# Patient Record
Sex: Female | Born: 1935 | Race: White | Hispanic: No | Smoking: Never smoker
Health system: Southern US, Community
[De-identification: ages and names within clinical notes are randomized; demographics above are authoritative.]

## PROBLEM LIST (undated history)

## (undated) DIAGNOSIS — R918 Other nonspecific abnormal finding of lung field: Secondary | ICD-10-CM

## (undated) DIAGNOSIS — M7120 Synovial cyst of popliteal space [Baker], unspecified knee: Secondary | ICD-10-CM

## (undated) DIAGNOSIS — H9190 Unspecified hearing loss, unspecified ear: Secondary | ICD-10-CM

## (undated) DIAGNOSIS — F419 Anxiety disorder, unspecified: Secondary | ICD-10-CM

## (undated) DIAGNOSIS — E7800 Pure hypercholesterolemia, unspecified: Secondary | ICD-10-CM

## (undated) DIAGNOSIS — F32A Depression, unspecified: Secondary | ICD-10-CM

## (undated) DIAGNOSIS — E1140 Type 2 diabetes mellitus with diabetic neuropathy, unspecified: Secondary | ICD-10-CM

## (undated) DIAGNOSIS — D67 Hereditary factor IX deficiency: Secondary | ICD-10-CM

## (undated) DIAGNOSIS — C439 Malignant melanoma of skin, unspecified: Secondary | ICD-10-CM

## (undated) DIAGNOSIS — F329 Major depressive disorder, single episode, unspecified: Secondary | ICD-10-CM

## (undated) DIAGNOSIS — I10 Essential (primary) hypertension: Secondary | ICD-10-CM

## (undated) DIAGNOSIS — S92309A Fracture of unspecified metatarsal bone(s), unspecified foot, initial encounter for closed fracture: Secondary | ICD-10-CM

## (undated) DIAGNOSIS — K5730 Diverticulosis of large intestine without perforation or abscess without bleeding: Secondary | ICD-10-CM

## (undated) HISTORY — DX: Diverticulosis of large intestine without perforation or abscess without bleeding: K57.30

## (undated) HISTORY — PX: TONSILLECTOMY AND ADENOIDECTOMY: SUR1326

## (undated) HISTORY — DX: Type 2 diabetes mellitus with diabetic neuropathy, unspecified: E11.40

## (undated) HISTORY — DX: Hereditary factor IX deficiency: D67

## (undated) HISTORY — DX: Essential (primary) hypertension: I10

## (undated) HISTORY — DX: Fracture of unspecified metatarsal bone(s), unspecified foot, initial encounter for closed fracture: S92.309A

## (undated) HISTORY — DX: Unspecified hearing loss, unspecified ear: H91.90

## (undated) HISTORY — DX: Synovial cyst of popliteal space (Baker), unspecified knee: M71.20

## (undated) HISTORY — DX: Other nonspecific abnormal finding of lung field: R91.8

## (undated) HISTORY — DX: Major depressive disorder, single episode, unspecified: F32.9

## (undated) HISTORY — DX: Depression, unspecified: F32.A

## (undated) HISTORY — DX: Pure hypercholesterolemia, unspecified: E78.00

## (undated) HISTORY — PX: OTHER SURGICAL HISTORY: SHX169

## (undated) HISTORY — DX: Malignant melanoma of skin, unspecified: C43.9

## (undated) HISTORY — DX: Anxiety disorder, unspecified: F41.9

---

## 2001-01-28 ENCOUNTER — Encounter: Admission: RE | Admit: 2001-01-28 | Discharge: 2001-02-27 | Payer: Self-pay | Admitting: Emergency Medicine

## 2004-11-25 ENCOUNTER — Encounter: Admission: RE | Admit: 2004-11-25 | Discharge: 2004-11-25 | Payer: Self-pay | Admitting: Family Medicine

## 2005-05-09 ENCOUNTER — Other Ambulatory Visit: Admission: RE | Admit: 2005-05-09 | Discharge: 2005-05-09 | Payer: Self-pay | Admitting: Internal Medicine

## 2005-06-26 LAB — HM COLONOSCOPY

## 2008-05-26 DIAGNOSIS — C439 Malignant melanoma of skin, unspecified: Secondary | ICD-10-CM

## 2008-05-26 HISTORY — DX: Malignant melanoma of skin, unspecified: C43.9

## 2008-07-06 ENCOUNTER — Ambulatory Visit (HOSPITAL_COMMUNITY): Admission: RE | Admit: 2008-07-06 | Discharge: 2008-07-06 | Payer: Self-pay | Admitting: Surgery

## 2008-08-19 ENCOUNTER — Other Ambulatory Visit: Admission: RE | Admit: 2008-08-19 | Discharge: 2008-08-19 | Payer: Self-pay | Admitting: Family Medicine

## 2008-08-19 LAB — HM PAP SMEAR

## 2008-11-23 HISTORY — PX: OTHER SURGICAL HISTORY: SHX169

## 2009-10-26 DIAGNOSIS — S92309A Fracture of unspecified metatarsal bone(s), unspecified foot, initial encounter for closed fracture: Secondary | ICD-10-CM

## 2009-10-26 HISTORY — DX: Fracture of unspecified metatarsal bone(s), unspecified foot, initial encounter for closed fracture: S92.309A

## 2009-11-08 IMAGING — PT NM PET TUM IMG WHOLE BODY
1 of 6 series · 5 of 25 positions shown · non-contrast
Comparison: None

CLINICAL DATA: Melanoma of right leg.

NUCLEAR MEDICINE WHOLE BODY PET/CT
TECHNIQUE: 16.5 mCi F-18 FDG was injected intravenously via the
right antecubital.  Full-ring PET imaging was performed from the
skull vertex through the lower extremities 82 minutes after
injection.  CT data was obtained and used for attenuation
correction and anatomic localization only.  (This was not acquired
as a diagnostic CT examination.)
Fasting Blood Glucose:  151

[Series 2: ct images · axial · 3.8mm · 0.98mm/px · z∈[-726,-147]mm · 5 of 267 slices shown]
[im 45/267  soft-tissue]
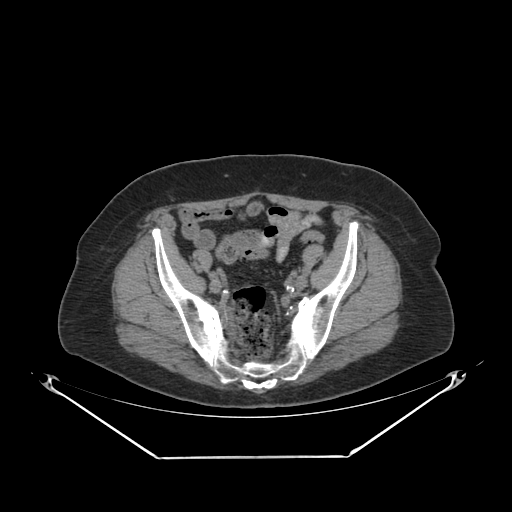
[im 89/267  soft-tissue]
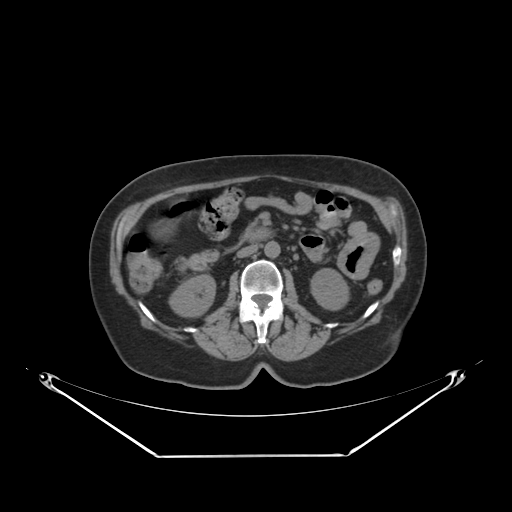
[im 134/267  soft-tissue]
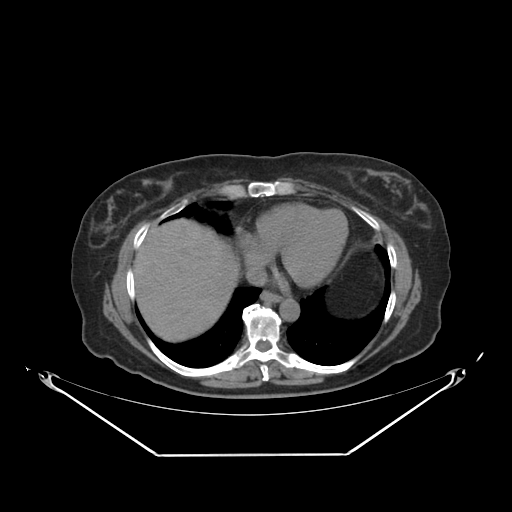
[im 178/267  soft-tissue]
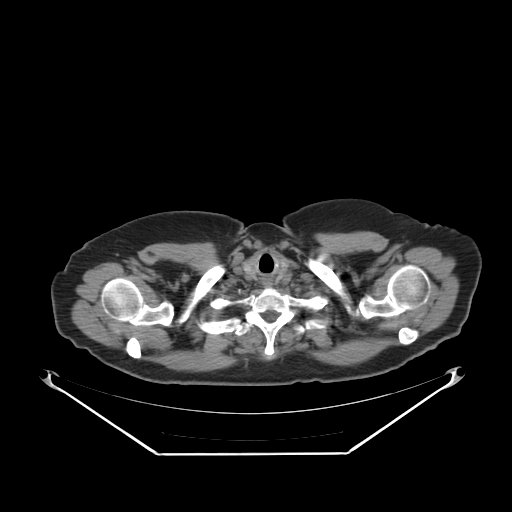
[im 222/267  soft-tissue]
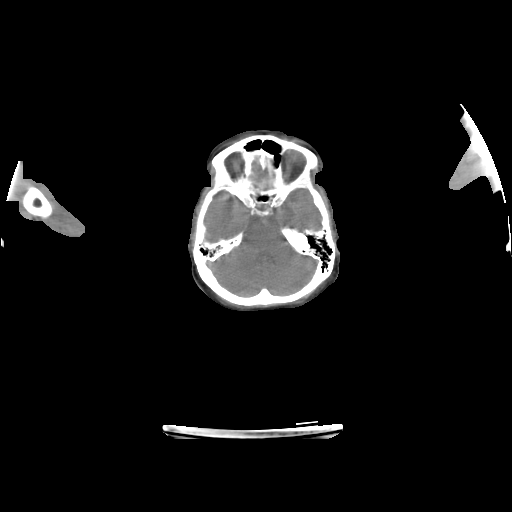

[5 of 25 positions shown; findings below may reference images not displayed]

FINDINGS: Head/ Neck: No hypermetabolic activity within the head or neck to
suggest melanoma.  No hypermetabolic nodes.  Uptake within the
right lateral aspect of the C2 facet is felt to be degenerative.

Chest: No hypermetabolic mediastinal or hilar nodes.  No suspicious
pulmonary nodules.

Abdomen / Pelvis: No abnormal hypermetabolic activity  within the
solid organs.  No evidence of abdominal or pelvic hypermetabolic
nodes.

Skeleton: No focal hypermetabolic activity to suggest skeletal
metastasis.

Lower extremities:  Mild superficial hypermetabolic activity within
the posterior lateral right calf likely represents excision site.
There is no focal activity to suggest metastatic or nodal disease.
Sclerotic lesion in the distal right femoral metaphysis consistent
with benign enchondroma versus infarct.
IMPRESSION: 1..  No evidence of metastatic melanoma.
2..  Likley excision site posterior right calf.

## 2009-11-09 ENCOUNTER — Encounter: Admission: RE | Admit: 2009-11-09 | Discharge: 2009-11-09 | Payer: Self-pay | Admitting: Family Medicine

## 2011-01-11 ENCOUNTER — Ambulatory Visit (INDEPENDENT_AMBULATORY_CARE_PROVIDER_SITE_OTHER): Payer: Medicare Other | Admitting: Family Medicine

## 2011-01-11 DIAGNOSIS — I10 Essential (primary) hypertension: Secondary | ICD-10-CM

## 2011-01-11 DIAGNOSIS — Z79899 Other long term (current) drug therapy: Secondary | ICD-10-CM

## 2011-01-11 DIAGNOSIS — E7800 Pure hypercholesterolemia, unspecified: Secondary | ICD-10-CM

## 2011-01-11 DIAGNOSIS — E119 Type 2 diabetes mellitus without complications: Secondary | ICD-10-CM

## 2011-01-16 ENCOUNTER — Ambulatory Visit (INDEPENDENT_AMBULATORY_CARE_PROVIDER_SITE_OTHER): Payer: Medicare Other

## 2011-01-16 DIAGNOSIS — E119 Type 2 diabetes mellitus without complications: Secondary | ICD-10-CM

## 2011-01-25 HISTORY — PX: CATARACT EXTRACTION, BILATERAL: SHX1313

## 2011-03-14 IMAGING — CR DG FOOT COMPLETE 3+V*L*
3 series · 3 of 3 positions shown · non-contrast
Comparison: None

CLINICAL DATA: Left foot pain for 2 weeks

LEFT FOOT - COMPLETE 3+ VIEW

[view not recorded (1 of 3)]
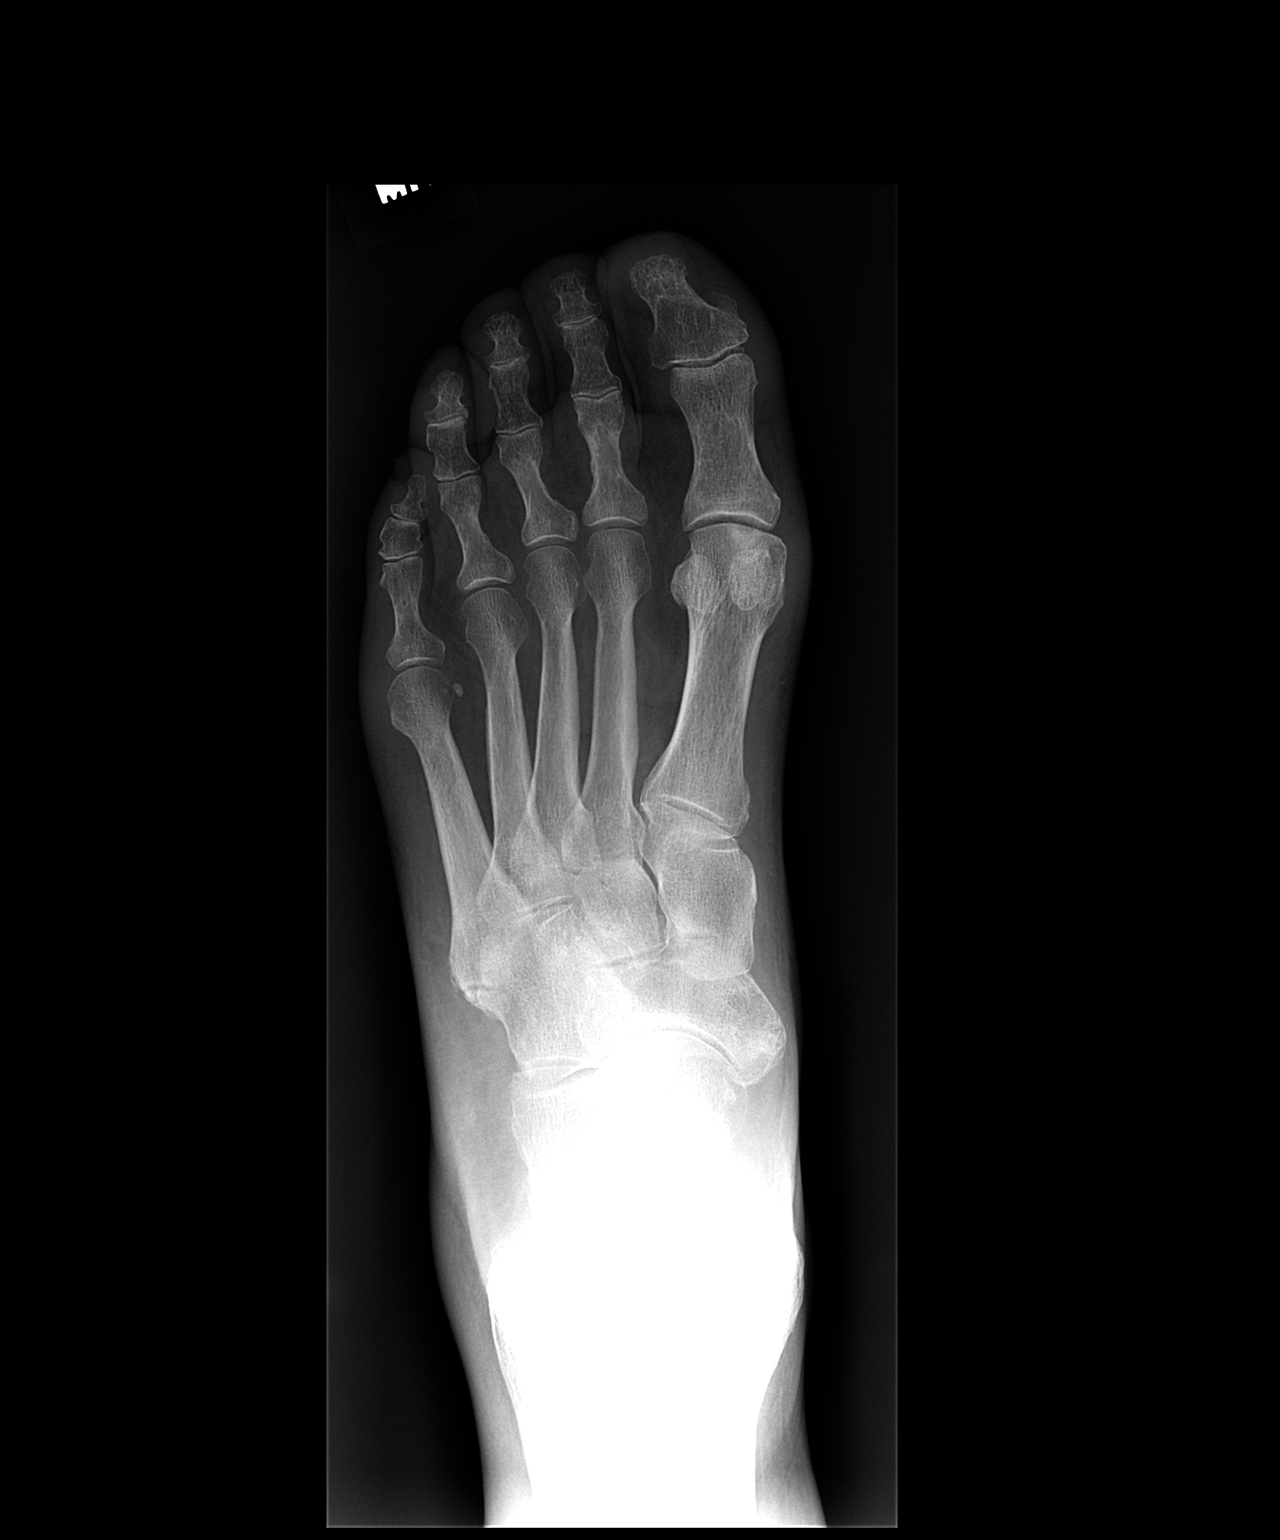

[view not recorded (2 of 3)]
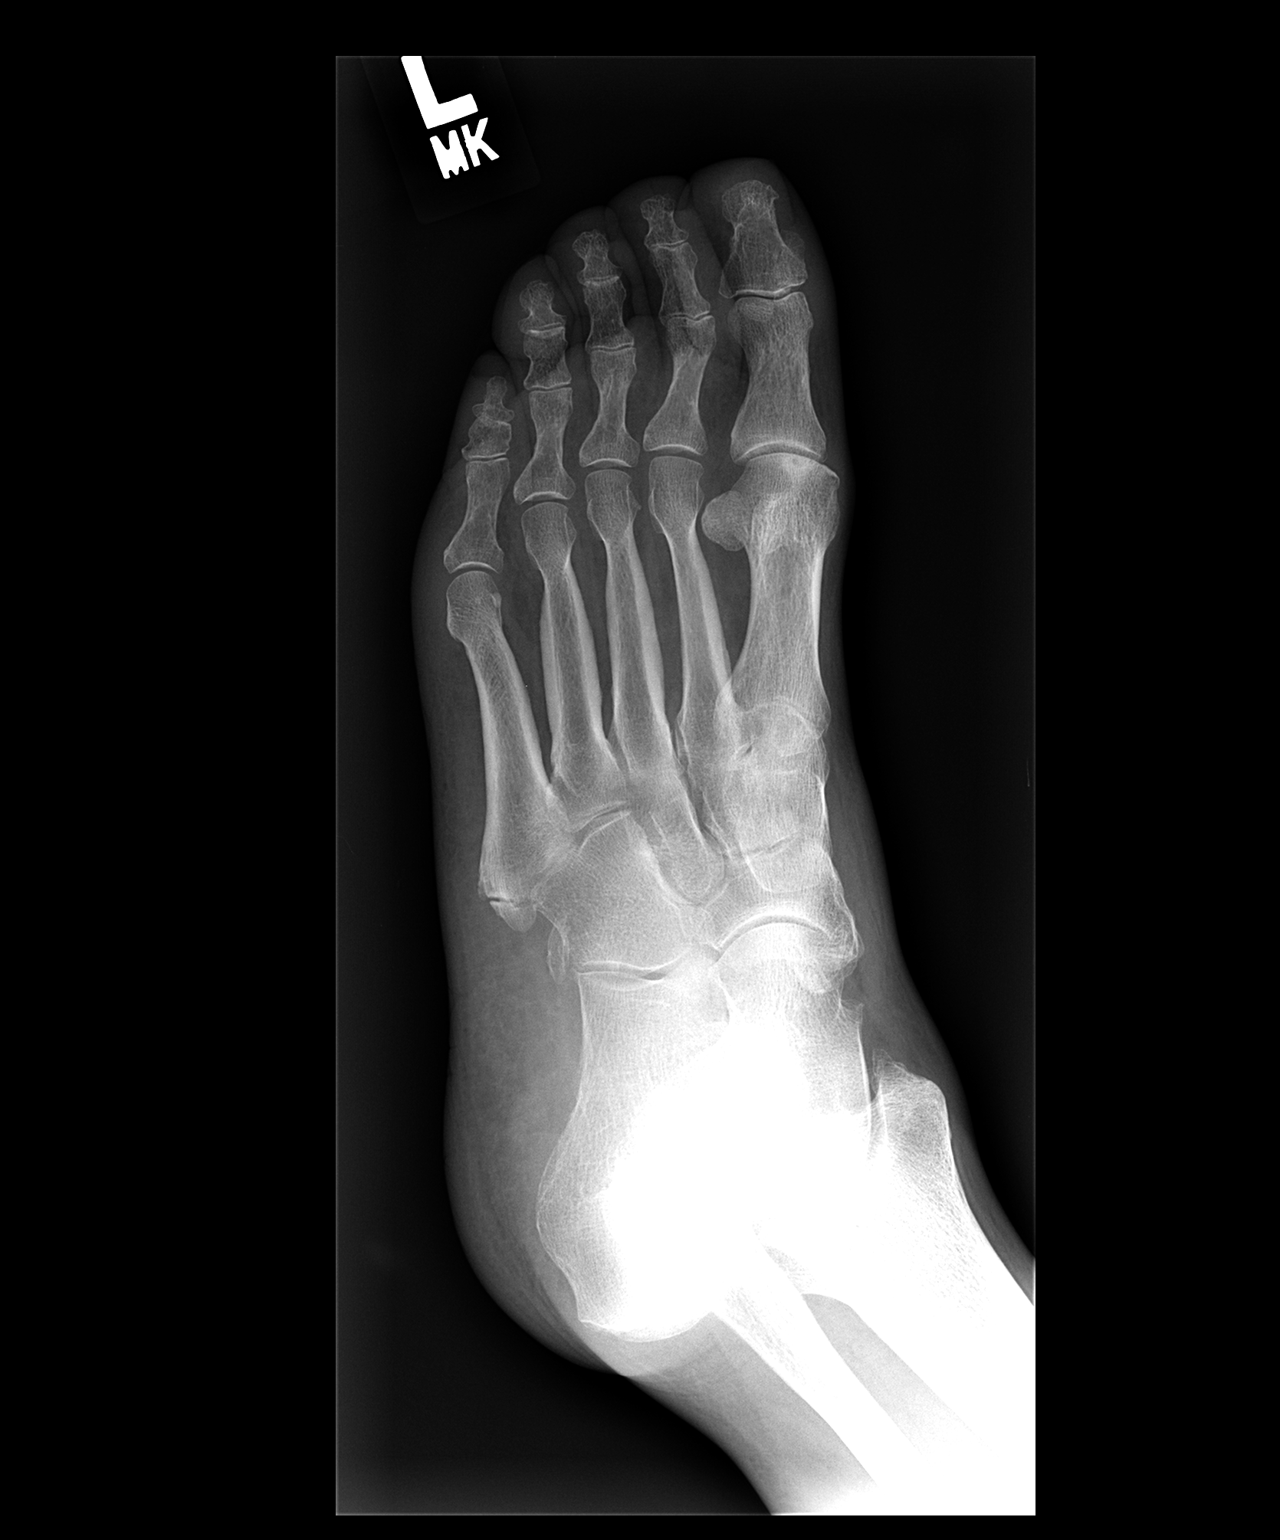

[view not recorded (3 of 3)]
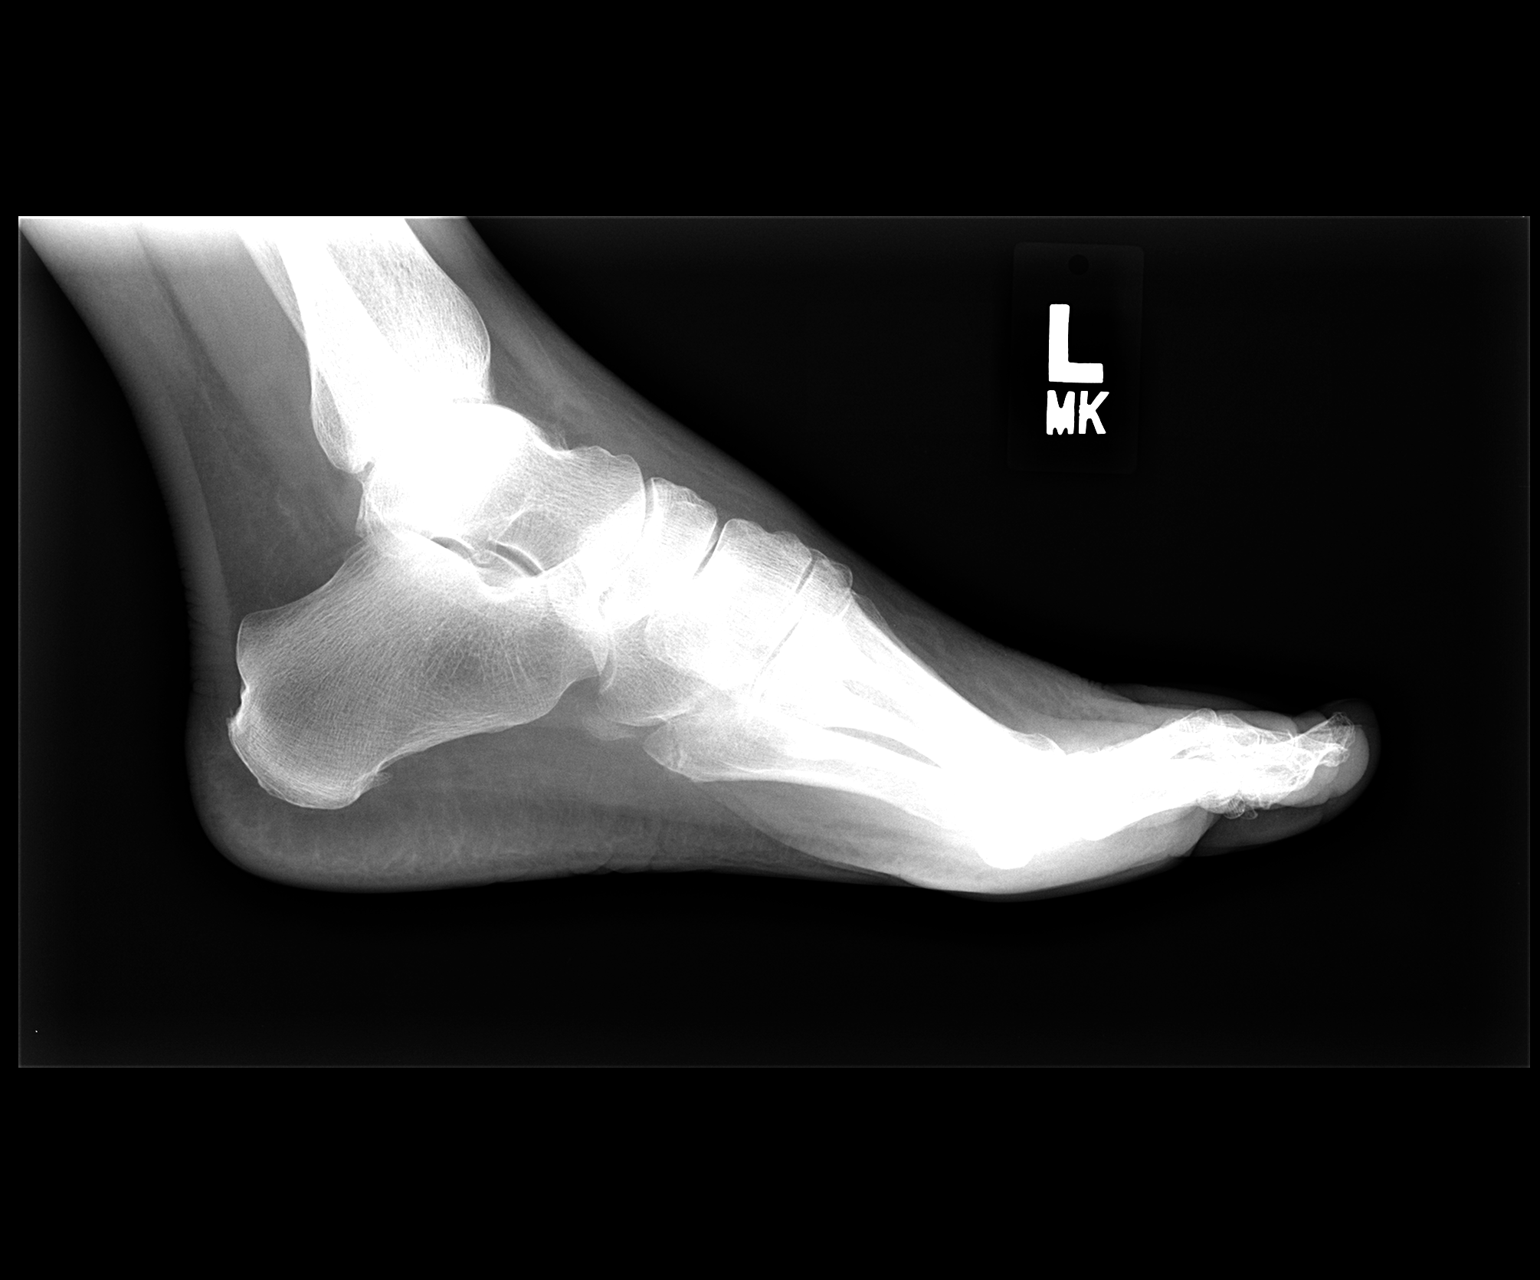

[3 of 3 positions shown; findings below may reference images not displayed]

FINDINGS: There is a fracture at the base of the fifth metatarsal.
The margins of this of fracture are sclerotic indicating a subacute
fracture or  chronic fracture.  No additional evidence of fracture
or dislocation.  No soft tissue abnormality.
IMPRESSION: A subacute versus chronic fracture at the base the fifth
metatarsal.
aa

## 2011-05-02 ENCOUNTER — Encounter (HOSPITAL_BASED_OUTPATIENT_CLINIC_OR_DEPARTMENT_OTHER): Payer: Medicare Other | Attending: Plastic Surgery

## 2011-05-02 DIAGNOSIS — M8708 Idiopathic aseptic necrosis of bone, other site: Secondary | ICD-10-CM | POA: Insufficient documentation

## 2011-05-02 DIAGNOSIS — T50995A Adverse effect of other drugs, medicaments and biological substances, initial encounter: Secondary | ICD-10-CM | POA: Insufficient documentation

## 2011-05-02 DIAGNOSIS — Z79899 Other long term (current) drug therapy: Secondary | ICD-10-CM | POA: Insufficient documentation

## 2011-05-02 DIAGNOSIS — M542 Cervicalgia: Secondary | ICD-10-CM | POA: Insufficient documentation

## 2011-05-10 NOTE — Assessment & Plan Note (Unsigned)
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  She was taking bisphosphonate for 5 years for osteoporosis, which did improve.  However, she ended up with osteonecrosis of the mandible on the left side.  She has braces and when the bands were placed from maxilla to mandible, she had excruciating pain, and she was found to have the necrosis.  She has had some bone chips removed.  She is currently on clindamycin.  She has no open wound or sores.  She notices some anterior neck pain, but otherwise she is doing well.  There has been no change in her review of systems or medications other than that listed.  PHYSICAL EXAMINATION:  GENERAL:  She is alert and oriented, cooperative, in no acute distress. HEENT:  Her pupils are equal.  Extraocular muscles are intact. LUNGS:  Clear. HEART:  Regular. ABDOMEN:  Soft. LOWER EXTREMITIES:  There is no point tenderness on the mandible.  No sign of infection.  Peripheral pulses are strong and regular.  ASSESSMENT:  Osteonecrosis of the mandible.  PLAN:  I left a message for Dr. Claiborne Billings letting him know we are working on approval for HBO.  The plan is for HBO, and we hope to know something in the next few days on approval, and would like to start her next week at the hospital.     Wayland Denis, DO     CS/MEDQ  D:  05/09/2011  T:  05/10/2011  Job:  [id number]

## 2011-06-11 ENCOUNTER — Telehealth: Payer: Self-pay | Admitting: Family Medicine

## 2011-06-11 MED ORDER — EXENATIDE 10 MCG/0.04ML ~~LOC~~ SOPN: 5.0000 ug | PEN_INJECTOR | Freq: Two times a day (BID) | SUBCUTANEOUS | Status: DC

## 2011-06-12 NOTE — Telephone Encounter (Signed)
done

## 2011-06-18 ENCOUNTER — Other Ambulatory Visit: Payer: Self-pay | Admitting: Medical

## 2011-06-18 ENCOUNTER — Telehealth: Payer: Self-pay | Admitting: Family Medicine

## 2011-06-18 NOTE — Telephone Encounter (Signed)
pls verify exactly what milligram dose she is taking twice daily?  Does she recall whether Dr. Lynelle Doctor changed the dose?

## 2011-06-18 NOTE — Telephone Encounter (Signed)
I can't tell what the correct dose is.  I was just refilling.  Find out what she has been doing, vs 25 mcg BID? If Dr. Lynelle Doctor didn't change it from her recollection, then we'll go with whatever she has been doing.

## 2011-06-19 NOTE — Telephone Encounter (Signed)
CALLED PT. SHE VERIFIED THAT MED SHOULD NOT HAVE CHANGED. SHE USED TWO TIMES PER DAY. CALLED [facility] TO LET THEM KNOW THIS INFO. THEY WILL BE SENDING THE 90DAY SUPPLY TO THE PT.

## 2011-06-28 ENCOUNTER — Encounter: Payer: Self-pay | Admitting: Family Medicine

## 2011-07-12 ENCOUNTER — Ambulatory Visit (INDEPENDENT_AMBULATORY_CARE_PROVIDER_SITE_OTHER): Payer: Medicare Other | Admitting: Family Medicine

## 2011-07-12 ENCOUNTER — Encounter: Payer: Self-pay | Admitting: Family Medicine

## 2011-07-12 ENCOUNTER — Ambulatory Visit: Payer: Medicare Other | Admitting: Family Medicine

## 2011-07-12 VITALS — BP 128/72 | HR 80 | Ht 60.0 in | Wt 159.0 lb

## 2011-07-12 DIAGNOSIS — E119 Type 2 diabetes mellitus without complications: Secondary | ICD-10-CM

## 2011-07-12 DIAGNOSIS — I10 Essential (primary) hypertension: Secondary | ICD-10-CM

## 2011-07-12 DIAGNOSIS — E7800 Pure hypercholesterolemia, unspecified: Secondary | ICD-10-CM

## 2011-07-12 DIAGNOSIS — L659 Nonscarring hair loss, unspecified: Secondary | ICD-10-CM

## 2011-07-12 LAB — HEPATIC FUNCTION PANEL
ALT: 23 U/L (ref 0–35)
AST: 29 U/L (ref 0–37)
Alkaline Phosphatase: 52 U/L (ref 39–117)
Bilirubin, Direct: 0.1 mg/dL (ref 0.0–0.3)
Indirect Bilirubin: 0.5 mg/dL (ref 0.0–0.9)
Total Bilirubin: 0.6 mg/dL (ref 0.3–1.2)

## 2011-07-12 LAB — POCT GLYCOSYLATED HEMOGLOBIN (HGB A1C): Hemoglobin A1C: 6.2

## 2011-07-12 LAB — TSH: TSH: 4.71 u[IU]/mL — ABNORMAL HIGH (ref 0.350–4.500)

## 2011-07-12 LAB — LIPID PANEL
Cholesterol: 163 mg/dL (ref 0–200)
HDL: 57 mg/dL (ref >39)
LDL Cholesterol: 76 mg/dL (ref 0–99)

## 2011-07-12 NOTE — Progress Notes (Signed)
Patient presents for 6 month follow-up. Denies any specific complaints or concerns.  Frustrated that she has gained some weight.  She is walking 2 miles daily.  Diabetes follow-up:  Blood sugars at home are running 96-136, usually <120.  Denies hypoglycemia.  Denies polydipsia and polyuria.  Last eye exam was earlier this year (had cataract surgery in March)  Has appt today.  Patient follows a low sugar diet and checks feet regularly without concerns.  Hypertension follow-up:  Blood pressures elsewhere are 120-130/70's.  Denies dizziness, headaches, chest pain.  Denies side effects of medications.  Hyperlipidemia follow-up:  Patient is reportedly following a low-fat, low cholesterol diet.  Compliant with medications and denies medication side effects  Osteoporosis follow-up:  She stopped the Alendronate a year ago, due to jaw problems.  Has been taking calcium and vitamin D, and getting weight bearing exercise. Last DEXA was 12/2009  Past Medical History  Diagnosis Date  . Diabetes mellitus 2002  . Hypertension   . Pure hypercholesterolemia   . Melanoma 7/09    RLE with in-transit mets; s/p hyperthermic limb perfusion chemo and excision; re-excision of recurrence 2011  . Osteoporosis   . Diabetic neuropathy   . 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 Baker's cyst of knee     R popliteal  . Pulmonary nodules     small, seen on CT 12/09  . Cholelithiasis 12/09    asymptomatic, noticed on CT 12/09  . Alopecia     anterior hair loss  . Diverticulosis of colon   . Anxiety and depression     related to work--resolved  . Metatarsal fracture 10/2009    [facility]--Dr. Althea Charon    Past Surgical History  Procedure Date  . Tonsillectomy and adenoidectomy   . Excision of melanoma 11/23/08    wide excision of melanoma and R inguinal sentinel LN mapping and  biopsy  . Excision of recurrent melanoma 2011    RLE  . Cataract extraction, bilateral  01/2011    History   Social History  . Marital Status: Divorced    Spouse Name: N/A    Number of Children: 2  . Years of Education: N/A   Occupational History  . 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 Smoking status: Never Smoker   . Smokeless tobacco: Never Used  . Alcohol Use: Yes     4 oz wine 3-4 times a week  . Drug Use: No  . Sexually Active: Not on file   Other Topics Concern  . Not on file   Social History Narrative   Daughter in [place] and son in [place]    Family History  Problem Relation Age of Onset  . Hypertension Mother   . Arthritis Father     Current outpatient prescriptions:aspirin 81 MG tablet, Take 81 mg by mouth 2 (two) times daily.  , Disp: , Rfl: ;  Calcium Carbonate-Vitamin D (CALCIUM + D) 600-200 MG-UNIT TABS, Take 2 tablets by mouth daily.  , Disp: , Rfl: ;  Cholecalciferol (VITAMIN D) 1000 UNITS capsule, Take 2,000 Units by mouth daily.  , Disp: , Rfl: ;  Exenatide (BYETTA 10 MCG PEN [place]), Inject 10 mcg into the skin 2 (two) times daily.  , Disp: , Rfl:  fish oil-omega-3 fatty acids 1000 MG capsule, Take 2 g by mouth daily.  , Disp: , Rfl: ;  folic acid (FOLVITE) 400 MCG  tablet, Take 400 mcg by mouth daily.  , Disp: , Rfl: ;  magnesium gluconate (MAGONATE) 500 MG tablet, Take 500 mg by mouth 2 (two) times daily.  , Disp: , Rfl: ;  metFORMIN (GLUMETZA) 1000 MG (MOD) 24 hr tablet, Take 1,000 mg by mouth 2 (two) times daily with a meal. , Disp: , Rfl:  Multiple Vitamins-Minerals (MULTIVITAMIN WITH MINERALS) tablet, Take 1 tablet by mouth daily.  , Disp: , Rfl: ;  simvastatin (ZOCOR) 40 MG tablet, Take 40 mg by mouth at bedtime.  , Disp: , Rfl: ;  valsartan (DIOVAN) 160 MG tablet, Take 160 mg by mouth daily.  , Disp: , Rfl: ;  vitamin C (ASCORBIC ACID) 500 MG tablet, Take 500 mg by mouth daily.  , Disp: , Rfl:  vitamin B-12 (CYANOCOBALAMIN) 100 MCG tablet, Take 50 mcg by mouth daily.  , Disp: , Rfl:   No Known Allergies  ROS:  hair loss and loss of eyebrows (chronic, not recent), constipation, some weight gain. Denies fevers, URI symptoms, cough, SOB, chest pain, GI complaints, GU complaints, joint pains, skin rashes or other concerns.  Normal moods  PHYSICAL EXAM:  Well developed, well nourished patient, in no distress BP 128/72  Pulse 80  Ht 5' (1.524 m)  Wt 159 lb (72.122 kg)  BMI 31.05 kg/m2 Neck: No lymphadenopathy or thyromegaly, no carotid bruit Heart:  Regular rate and rhythm, 2/6 SEM, no rubs, gallops or ectopy Lungs:  Clear bilaterally, without wheezes, rales or ronchi Abdomen:  Soft, nontender, nondistended, no hepatosplenomegaly or masses, normal bowel sounds Extremities:  No clubbing, cyanosis or edema, 2+ pulses.  Neuro:  Alert and oriented x 3, cranial nerves grossly intact.  DTR's 2+ and symmetric.  Normal strength and sensation Back:  No spine or CVA tenderness Skin: no rashes or suspicious lesions Psych:  Normal mood, affect, hygiene and grooming, normal speech, eye contact  ASSESSMENT/PLAN: 1. Type II or unspecified type diabetes mellitus without mention of complication, not stated as uncontrolled  POCT HgB A1C, Microalbumin / creatinine urine ratio  2. Pure hypercholesterolemia  Lipid panel, Hepatic function panel  3. Essential hypertension, benign    4. Hair loss  TSH  DM--controlled HTN--controlled Lipids--check today Osteoporosis--most recent DEXA showed osteopenia.  Maintain Ca and Vitamin D, stay off alendronate.  Plan to recheck DEXA 12/2011 (will see if they allow [place] to still get at [facility] bone density; if not, change to [place], where she gets her mammograms)

## 2011-07-13 ENCOUNTER — Encounter: Payer: Self-pay | Admitting: Family Medicine

## 2011-07-13 LAB — MICROALBUMIN / CREATININE URINE RATIO
Microalb Creat Ratio: 21.9 mg/g (ref 0.0–30.0)
Microalb, Ur: 1.77 mg/dL (ref 0.00–1.89)

## 2011-08-17 ENCOUNTER — Other Ambulatory Visit: Payer: Self-pay | Admitting: *Deleted

## 2011-08-17 DIAGNOSIS — E119 Type 2 diabetes mellitus without complications: Secondary | ICD-10-CM

## 2011-08-17 MED ORDER — EXENATIDE 10 MCG/0.04ML ~~LOC~~ SOPN: 10.0000 ug | PEN_INJECTOR | Freq: Two times a day (BID) | SUBCUTANEOUS | Status: DC

## 2011-08-24 LAB — GLUCOSE, CAPILLARY: Glucose-Capillary: 151 — ABNORMAL HIGH

## 2011-08-27 ENCOUNTER — Telehealth: Payer: Self-pay | Admitting: Family Medicine

## 2011-08-27 DIAGNOSIS — E119 Type 2 diabetes mellitus without complications: Secondary | ICD-10-CM

## 2011-08-27 MED ORDER — METFORMIN HCL ER (MOD) 1000 MG PO TB24: 1000.0000 mg | ORAL_TABLET | Freq: Two times a day (BID) | ORAL | Status: DC

## 2011-08-27 NOTE — Telephone Encounter (Signed)
Called metformin 1000mg  24 hr #60 bid 0rf's to [facility] as patient will be out of town and needed rx to [facility]. Pt notified.

## 2011-10-12 DIAGNOSIS — C439 Malignant melanoma of skin, unspecified: Secondary | ICD-10-CM | POA: Insufficient documentation

## 2011-11-27 HISTORY — PX: TYMPANOSTOMY TUBE PLACEMENT: SHX32

## 2011-12-14 DIAGNOSIS — H9319 Tinnitus, unspecified ear: Secondary | ICD-10-CM | POA: Diagnosis not present

## 2011-12-14 DIAGNOSIS — H9120 Sudden idiopathic hearing loss, unspecified ear: Secondary | ICD-10-CM | POA: Diagnosis not present

## 2011-12-26 DIAGNOSIS — H905 Unspecified sensorineural hearing loss: Secondary | ICD-10-CM | POA: Diagnosis not present

## 2011-12-26 DIAGNOSIS — H9319 Tinnitus, unspecified ear: Secondary | ICD-10-CM | POA: Diagnosis not present

## 2011-12-26 DIAGNOSIS — H9120 Sudden idiopathic hearing loss, unspecified ear: Secondary | ICD-10-CM | POA: Diagnosis not present

## 2012-01-15 DIAGNOSIS — H9120 Sudden idiopathic hearing loss, unspecified ear: Secondary | ICD-10-CM | POA: Diagnosis not present

## 2012-01-15 DIAGNOSIS — H9319 Tinnitus, unspecified ear: Secondary | ICD-10-CM | POA: Diagnosis not present

## 2012-01-15 DIAGNOSIS — H903 Sensorineural hearing loss, bilateral: Secondary | ICD-10-CM | POA: Diagnosis not present

## 2012-01-18 DIAGNOSIS — H9319 Tinnitus, unspecified ear: Secondary | ICD-10-CM | POA: Diagnosis not present

## 2012-01-18 DIAGNOSIS — H9120 Sudden idiopathic hearing loss, unspecified ear: Secondary | ICD-10-CM | POA: Diagnosis not present

## 2012-01-25 DIAGNOSIS — H9120 Sudden idiopathic hearing loss, unspecified ear: Secondary | ICD-10-CM | POA: Diagnosis not present

## 2012-01-25 DIAGNOSIS — H9319 Tinnitus, unspecified ear: Secondary | ICD-10-CM | POA: Diagnosis not present

## 2012-02-01 DIAGNOSIS — H9319 Tinnitus, unspecified ear: Secondary | ICD-10-CM | POA: Diagnosis not present

## 2012-02-01 DIAGNOSIS — H9120 Sudden idiopathic hearing loss, unspecified ear: Secondary | ICD-10-CM | POA: Diagnosis not present

## 2012-02-08 DIAGNOSIS — H9319 Tinnitus, unspecified ear: Secondary | ICD-10-CM | POA: Diagnosis not present

## 2012-02-08 DIAGNOSIS — H9120 Sudden idiopathic hearing loss, unspecified ear: Secondary | ICD-10-CM | POA: Diagnosis not present

## 2012-02-20 ENCOUNTER — Other Ambulatory Visit: Payer: Self-pay | Admitting: *Deleted

## 2012-02-20 DIAGNOSIS — I10 Essential (primary) hypertension: Secondary | ICD-10-CM

## 2012-02-20 MED ORDER — VALSARTAN 160 MG PO TABS: 160.0000 mg | ORAL_TABLET | Freq: Every day | ORAL | Status: DC

## 2012-02-27 DIAGNOSIS — Z1231 Encounter for screening mammogram for malignant neoplasm of breast: Secondary | ICD-10-CM | POA: Diagnosis not present

## 2012-02-27 LAB — HM MAMMOGRAPHY: HM Mammogram: NEGATIVE

## 2012-02-28 ENCOUNTER — Encounter: Payer: Self-pay | Admitting: Internal Medicine

## 2012-03-05 ENCOUNTER — Encounter: Payer: Self-pay | Admitting: Family Medicine

## 2012-03-05 ENCOUNTER — Ambulatory Visit (INDEPENDENT_AMBULATORY_CARE_PROVIDER_SITE_OTHER): Payer: Medicare Other | Admitting: Family Medicine

## 2012-03-05 VITALS — BP 130/78 | HR 68 | Ht 60.0 in | Wt 166.0 lb

## 2012-03-05 DIAGNOSIS — Z23 Encounter for immunization: Secondary | ICD-10-CM | POA: Diagnosis not present

## 2012-03-05 DIAGNOSIS — E7800 Pure hypercholesterolemia, unspecified: Secondary | ICD-10-CM

## 2012-03-05 DIAGNOSIS — L659 Nonscarring hair loss, unspecified: Secondary | ICD-10-CM

## 2012-03-05 DIAGNOSIS — E119 Type 2 diabetes mellitus without complications: Secondary | ICD-10-CM | POA: Diagnosis not present

## 2012-03-05 DIAGNOSIS — I10 Essential (primary) hypertension: Secondary | ICD-10-CM

## 2012-03-05 LAB — COMPREHENSIVE METABOLIC PANEL
ALT: 27 U/L (ref 0–35)
AST: 30 U/L (ref 0–37)
Albumin: 4.3 g/dL (ref 3.5–5.2)
Alkaline Phosphatase: 51 U/L (ref 39–117)
BUN: 14 mg/dL (ref 6–23)
CO2: 28 mEq/L (ref 19–32)
Chloride: 101 mEq/L (ref 96–112)
Creat: 0.88 mg/dL (ref 0.50–1.10)
Glucose, Bld: 172 mg/dL — ABNORMAL HIGH (ref 70–99)
Total Bilirubin: 0.6 mg/dL (ref 0.3–1.2)
Total Protein: 6.6 g/dL (ref 6.0–8.3)

## 2012-03-05 LAB — LIPID PANEL
Cholesterol: 142 mg/dL (ref 0–200)
HDL: 52 mg/dL (ref >39)
LDL Cholesterol: 50 mg/dL (ref 0–99)
Total CHOL/HDL Ratio: 2.7 Ratio
Triglycerides: 202 mg/dL — ABNORMAL HIGH (ref <150)
VLDL: 40 mg/dL (ref 0–40)

## 2012-03-05 LAB — TSH: TSH: 4.02 u[IU]/mL (ref 0.350–4.500)

## 2012-03-05 LAB — POCT GLYCOSYLATED HEMOGLOBIN (HGB A1C): Hemoglobin A1C: 8.3

## 2012-03-05 MED ORDER — EXENATIDE 10 MCG/0.04ML ~~LOC~~ SOPN: 10.0000 ug | PEN_INJECTOR | Freq: Two times a day (BID) | SUBCUTANEOUS | Status: DC

## 2012-03-05 NOTE — Progress Notes (Signed)
Patient presents for 6 month follow-up.  She lost the hearing in her left ear on 12/13/2011.  She saw Dr. Ezzard Standing, and was treated with oral prednisone x 2 weeks with no effect.  She then saw Dr. Suszanne Conners, who gave cortisone injections into the ear x 4 (specially made from [facility]), and has had some improvement. +ringing in the left ear.  Diabetes follow-up: Blood sugars at home are running 120-140 in the afternoons.  Had some higher values while on the prednisone. Denies hypoglycemia. Denies polydipsia and polyuria. Last eye exam was  6 months ago and is scheduled again soon. Patient follows a low sugar diet and checks feet regularly without concerns.   Hypertension follow-up: Blood pressures are not being checked elsewhere, although reports they are normal at ENT's office. Denies dizziness, headaches, chest pain. Denies side effects of medications.   Hyperlipidemia follow-up: Patient is reportedly following a low-fat, low cholesterol diet. Compliant with medications and denies medication side effects   Osteoporosis follow-up: She stopped the Alendronate a year and a half ago, due to jaw problems. Has been taking calcium and vitamin D, but hasn't been getting any exercise lately. Last DEXA was 12/2009  Review of chart--TSH 6 months ago was 4.7. Normal microalbumin/Cr ratio 6 months ago.  Lipids at goal and normal LFT's 6 months ago. 05/2010 had normal Vitamin D level on same current supplementation  Past Medical History  Diagnosis Date  . Diabetes mellitus 2002  . Hypertension   . Pure hypercholesterolemia   . Melanoma 7/09    RLE with in-transit mets; s/p hyperthermic limb perfusion chemo and excision; re-excision of recurrence 2011  . Osteoporosis   . Diabetic neuropathy   . 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 Baker's cyst of knee     R popliteal  . Pulmonary nodules     small, seen on CT 12/09  . Cholelithiasis 12/09   asymptomatic, noticed on CT 12/09  . Alopecia     anterior hair loss  . Diverticulosis of colon   . Anxiety and depression     related to work--resolved  . Metatarsal fracture 10/2009    [facility] 5[facility]--Dr. Althea Charon    Past Surgical History  Procedure Date  . Tonsillectomy and adenoidectomy   . Excision of melanoma 11/23/08    wide excision of melanoma and R inguinal sentinel LN mapping and  biopsy  . Excision of recurrent melanoma 2011    RLE  . Cataract extraction, bilateral 01/2011    History   Social History  . Marital Status: Divorced    Spouse Name: N/A    Number of Children: 2  . Years of Education: N/A   Occupational History  . 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 Smoking status: Never Smoker   . Smokeless tobacco: Never Used  . Alcohol Use: Yes     4 oz wine 3-4 times a week  . Drug Use: No  . Sexually Active: Not on file   Other Topics Concern  . Not on file   Social History Narrative   Daughter in [place] and son in [place]    Family History  Problem Relation Age of Onset  . Hypertension Mother   . Arthritis Father    Current Outpatient Prescriptions on File Prior to Visit  Medication Sig Dispense Refill  . aspirin 81 MG tablet Take 81 mg by mouth 2 (two) times  daily.        . Calcium Carbonate-Vitamin D (CALCIUM + D) 600-200 MG-UNIT TABS Take 2 tablets by mouth daily.        . Cholecalciferol (VITAMIN D) 1000 UNITS capsule Take 2,000 Units by mouth daily.        Marland Kitchen exenatide (BYETTA 10 MCG PEN) 10 MCG/0.04ML SOLN Inject 0.04 mLs (10 mcg total) into the skin 2 (two) times daily with a meal.  1.2 mL  0  . fish oil-omega-3 fatty acids 1000 MG capsule Take 2 g by mouth daily.        . magnesium gluconate (MAGONATE) 500 MG tablet Take 500 mg by mouth 2 (two) times daily.        . metFORMIN (GLUMETZA) 1000 MG (MOD) 24 hr tablet Take 1 tablet (1,000 mg total) by mouth 2 (two) times daily with a meal.  60 tablet  0  . Multiple  Vitamins-Minerals (MULTIVITAMIN WITH MINERALS) tablet Take 1 tablet by mouth daily.        . simvastatin (ZOCOR) 40 MG tablet Take 40 mg by mouth at bedtime.        . valsartan (DIOVAN) 160 MG tablet Take 1 tablet (160 mg total) by mouth daily.  90 tablet  0  . vitamin B-12 (CYANOCOBALAMIN) 100 MCG tablet Take 50 mcg by mouth daily.        . vitamin C (ASCORBIC ACID) 500 MG tablet Take 500 mg by mouth daily.         No Known Allergies  ROS:  Denies fevers, URI symptoms, cough, shortness of breath, chest pain. +weight gain.  Ongoing hair loss. + constipation.  No urinary or GI symptoms.  PHYSICAL EXAM: BP 130/78  Pulse 68  Ht 5' (1.524 m)  Wt 166 lb (75.297 kg)  BMI 32.42 kg/m2 Well developed, pleasant, overweight female in no distress Neck: no lymphadenopathy, thyromegaly or bruit Heart: Regular rate and rhythm, 2/6 SEM, no rubs, gallops or ectopy  Lungs: Clear bilaterally, without wheezes, rales or ronchi  Abdomen: Soft, nontender, nondistended, no hepatosplenomegaly or masses, normal bowel sounds  Extremities: No clubbing, cyanosis or edema, 2+ pulses. Normal sensation. Neuro: Alert and oriented x 3, cranial nerves grossly intact. DTR's 2+ and symmetric. Normal strength and sensation  Back: No spine or CVA tenderness   Psych: Normal mood, affect, hygiene and grooming, normal speech, eye contact  Lab Results  Component Value Date   HGBA1C 8.3 03/05/2012   ASSESSMENT/PLAN:  1. Type II or unspecified type diabetes mellitus without mention of complication, not stated as uncontrolled  POCT HgB A1C, Comprehensive metabolic panel  2. Pure hypercholesterolemia  Lipid panel  3. Essential hypertension, benign  Comprehensive metabolic panel  4. Hair loss  TSH  5. Need for Tdap vaccination  Tdap vaccine greater than or equal to 7yo IM   Hair loss--borderline thyroid test in past.  Repeat today. Start meds if TSH>4. HTN--controlled Osteoporosis--schedule DEXA at [facility]--f/u osteoporosis.   Last DEX[facility] 12/[facility] DM--poorly controlled per A1c, may be related to steroid use related to hearing loss.  Sugars seem okay per pts history.  Continue Byetta.  Briefly discussed Bydureon (weekly--will check if cost is more/less than Byetta). Daily exercise and weight loss recommended. Lipids--due for labs today.  F/u in 3 months for CPE, and repeat A1c, f/u DM

## 2012-03-05 NOTE — Progress Notes (Signed)
Addended byJoselyn Arrow on: 03/05/2012 09:08 PM   Modules accepted: Orders

## 2012-03-05 NOTE — Patient Instructions (Signed)
Bydureon is the name of the weekly injection, similar to the Byetta-- if you are interested in changing (ie cost is less) then you will need to make appointment with the nurse to learn how to use it and get starter information.  Try and exercise daily, and get some weight-bearing exercising to help keep bones strong.  We will be setting up bone density with [facility]--if you don't hear anything, please call us and let us know.  Tinnitus Tinnitus Sounds you hear in your ears and coming from within the ear is called tinnitus. This can be a symptom of many ear disorders. It is often associated with hearing loss.  Tinnitus can be seen with:  Infections.   Ear blockages such as wax buildup.   Meniere's disease.   Ear damage.   Inherited.   Occupational causes.  While irritating, it is not usually a threat to health. When the cause of the tinnitus is wax, infection in the middle ear, or foreign body it is easily treated. Hearing loss will usually be reversible.  TREATMENT  When treating the underlying cause does not get rid of tinnitus, it may be necessary to get rid of the unwanted sound by covering it up with more pleasant background noises. This may include music, the radio etc. There are tinnitus maskers which can be worn which produce background noise to cover up the tinnitus. Avoid all medications which tend to make tinnitus worse such as alcohol, caffeine, aspirin, and nicotine. There are many soothing background tapes such as rain, ocean, thunderstorms, etc. These soothing sounds help with sleeping or resting. Keep all follow-up appointments and referrals. This is important to identify the cause of the problem. It also helps avoid complications, impaired hearing, disability, or chronic pain. Document Released: 11/12/2005 Document Revised: 11/01/2011 Document Reviewed: 06/30/2008 [facility] Patient Information 2012 [place], [place].

## 2012-03-07 ENCOUNTER — Other Ambulatory Visit: Payer: Self-pay | Admitting: *Deleted

## 2012-03-07 DIAGNOSIS — E039 Hypothyroidism, unspecified: Secondary | ICD-10-CM

## 2012-03-07 MED ORDER — SYNTHROID 25 MCG PO TABS: 25.0000 ug | ORAL_TABLET | Freq: Every day | ORAL | Status: DC

## 2012-03-10 DIAGNOSIS — H9120 Sudden idiopathic hearing loss, unspecified ear: Secondary | ICD-10-CM | POA: Diagnosis not present

## 2012-03-10 DIAGNOSIS — H9319 Tinnitus, unspecified ear: Secondary | ICD-10-CM | POA: Diagnosis not present

## 2012-03-10 DIAGNOSIS — H903 Sensorineural hearing loss, bilateral: Secondary | ICD-10-CM | POA: Diagnosis not present

## 2012-03-11 ENCOUNTER — Telehealth: Payer: Self-pay | Admitting: Family Medicine

## 2012-03-11 DIAGNOSIS — M949 Disorder of cartilage, unspecified: Secondary | ICD-10-CM | POA: Diagnosis not present

## 2012-03-11 DIAGNOSIS — E119 Type 2 diabetes mellitus without complications: Secondary | ICD-10-CM

## 2012-03-11 MED ORDER — EXENATIDE 10 MCG/0.04ML ~~LOC~~ SOPN: 10.0000 ug | PEN_INJECTOR | Freq: Two times a day (BID) | SUBCUTANEOUS | Status: DC

## 2012-03-11 NOTE — Telephone Encounter (Signed)
Pt called and stated that her normal mail in pharm is out of her byetta and will not be available for a week.  Pt needs one pen sent over to [facility] [address]. This should be enough to last until mail order gets shipment

## 2012-03-11 NOTE — Telephone Encounter (Signed)
We did not have a sample.

## 2012-03-11 NOTE — Telephone Encounter (Signed)
Fill 1 byetta pen for pt until she gets her other byetta through the mail.

## 2012-03-11 NOTE — Telephone Encounter (Signed)
Another option is to give a sample, if we have.  Thanks

## 2012-03-18 ENCOUNTER — Encounter: Payer: Self-pay | Admitting: Family Medicine

## 2012-04-01 ENCOUNTER — Telehealth: Payer: Self-pay | Admitting: Family Medicine

## 2012-04-02 ENCOUNTER — Other Ambulatory Visit: Payer: Self-pay | Admitting: *Deleted

## 2012-04-02 DIAGNOSIS — E119 Type 2 diabetes mellitus without complications: Secondary | ICD-10-CM

## 2012-04-02 MED ORDER — METFORMIN HCL ER (MOD) 1000 MG PO TB24: 1000.0000 mg | ORAL_TABLET | Freq: Two times a day (BID) | ORAL | Status: DC

## 2012-04-02 NOTE — Telephone Encounter (Signed)
DONE

## 2012-04-16 DIAGNOSIS — C439 Malignant melanoma of skin, unspecified: Secondary | ICD-10-CM | POA: Diagnosis not present

## 2012-04-23 ENCOUNTER — Other Ambulatory Visit: Payer: Medicare Other

## 2012-04-23 DIAGNOSIS — E039 Hypothyroidism, unspecified: Secondary | ICD-10-CM

## 2012-04-24 LAB — TSH: TSH: 3.438 u[IU]/mL (ref 0.350–4.500)

## 2012-04-24 LAB — PTH, INTACT AND CALCIUM
Calcium, Total (PTH): 9.7 mg/dL (ref 8.4–10.5)
PTH: 40.1 pg/mL (ref 14.0–72.0)

## 2012-04-28 ENCOUNTER — Other Ambulatory Visit: Payer: Self-pay | Admitting: *Deleted

## 2012-04-28 DIAGNOSIS — E039 Hypothyroidism, unspecified: Secondary | ICD-10-CM

## 2012-04-28 MED ORDER — SIMVASTATIN 40 MG PO TABS: 40.0000 mg | ORAL_TABLET | Freq: Every day | ORAL | Status: DC

## 2012-05-01 ENCOUNTER — Encounter: Payer: Self-pay | Admitting: Family Medicine

## 2012-05-05 ENCOUNTER — Other Ambulatory Visit: Payer: Self-pay | Admitting: *Deleted

## 2012-05-05 DIAGNOSIS — E039 Hypothyroidism, unspecified: Secondary | ICD-10-CM

## 2012-05-05 MED ORDER — SYNTHROID 25 MCG PO TABS: 25.0000 ug | ORAL_TABLET | Freq: Every day | ORAL | Status: DC

## 2012-05-15 ENCOUNTER — Other Ambulatory Visit: Payer: Self-pay | Admitting: *Deleted

## 2012-05-15 DIAGNOSIS — E119 Type 2 diabetes mellitus without complications: Secondary | ICD-10-CM

## 2012-05-15 MED ORDER — METFORMIN HCL 1000 MG PO TABS: 1000.0000 mg | ORAL_TABLET | Freq: Two times a day (BID) | ORAL | Status: DC

## 2012-06-02 ENCOUNTER — Telehealth: Payer: Self-pay | Admitting: Family Medicine

## 2012-06-02 DIAGNOSIS — I10 Essential (primary) hypertension: Secondary | ICD-10-CM

## 2012-06-02 MED ORDER — VALSARTAN 160 MG PO TABS: 160.0000 mg | ORAL_TABLET | Freq: Every day | ORAL | Status: DC

## 2012-06-02 NOTE — Telephone Encounter (Signed)
Done

## 2012-06-11 ENCOUNTER — Encounter: Payer: Self-pay | Admitting: Family Medicine

## 2012-06-11 ENCOUNTER — Ambulatory Visit (INDEPENDENT_AMBULATORY_CARE_PROVIDER_SITE_OTHER): Payer: Medicare Other | Admitting: Family Medicine

## 2012-06-11 VITALS — BP 130/76 | HR 72 | Ht 60.0 in | Wt 161.0 lb

## 2012-06-11 DIAGNOSIS — E119 Type 2 diabetes mellitus without complications: Secondary | ICD-10-CM | POA: Diagnosis not present

## 2012-06-11 DIAGNOSIS — Z01419 Encounter for gynecological examination (general) (routine) without abnormal findings: Secondary | ICD-10-CM | POA: Diagnosis not present

## 2012-06-11 DIAGNOSIS — E7800 Pure hypercholesterolemia, unspecified: Secondary | ICD-10-CM | POA: Diagnosis not present

## 2012-06-11 DIAGNOSIS — I10 Essential (primary) hypertension: Secondary | ICD-10-CM

## 2012-06-11 DIAGNOSIS — H9120 Sudden idiopathic hearing loss, unspecified ear: Secondary | ICD-10-CM | POA: Diagnosis not present

## 2012-06-11 LAB — POCT URINALYSIS DIPSTICK
Blood, UA: NEGATIVE
Glucose, UA: NEGATIVE
Ketones, UA: NEGATIVE
Spec Grav, UA: 1.01
Urobilinogen, UA: NEGATIVE

## 2012-06-11 LAB — POCT GLYCOSYLATED HEMOGLOBIN (HGB A1C): Hemoglobin A1C: 7.2

## 2012-06-11 MED ORDER — SIMVASTATIN 40 MG PO TABS: 40.0000 mg | ORAL_TABLET | Freq: Every day | ORAL | Status: DC

## 2012-06-11 MED ORDER — LOSARTAN POTASSIUM 100 MG PO TABS: 100.0000 mg | ORAL_TABLET | Freq: Every day | ORAL | Status: DC

## 2012-06-11 NOTE — Patient Instructions (Addendum)
HEALTH MAINTENANCE RECOMMENDATIONS:  It is recommended that you get at least 30 minutes of aerobic exercise at least 5 days/week (for weight loss, you may need as much as 60-90 minutes). This can be any activity that gets your heart rate up. This can be divided in 10-15 minute intervals if needed, but try and build up your endurance at least once a week.  Weight bearing exercise is also recommended twice weekly.  Eat a healthy diet with lots of vegetables, fruits and fiber.  "Colorful" foods have a lot of vitamins (ie green vegetables, tomatoes, red peppers, etc).  Limit sweet tea, regular sodas and alcoholic beverages, all of which has a lot of calories and sugar.  Up to 1 alcoholic drink daily may be beneficial for women (unless trying to lose weight, watch sugars).  Drink a lot of water.  Calcium recommendations are 1200-1500 mg daily (1500 mg for postmenopausal women or women without ovaries), and vitamin D 1000 IU daily.  This should be obtained from diet and/or supplements (vitamins), and calcium should not be taken all at once, but in divided doses.  Monthly self breast exams and yearly mammograms for women over the age of 36 is recommended.  Sunscreen of at least SPF 30 should be used on all sun-exposed parts of the skin when outside between the hours of 10 am and 4 pm (not just when at beach or pool, but even with exercise, golf, tennis, and yard work!)  Use a sunscreen that says "broad spectrum" so it covers both UVA and UVB rays, and make sure to reapply every 1-2 hours.  Remember to change the batteries in your smoke detectors when changing your clock times in the spring and fall.  Use your seat belt every time you are in a car, and please drive safely and not be distracted with cell phones and texting while driving.  Check BP once weekly if possible.  Goal is <130/80.  If consistently >135/85 let us know, as meds will need to be adjusted.  The Diovan and Losartan may not be exactly  equivalent, putting you on highest dose  Restart checking blood sugars.

## 2012-06-11 NOTE — Progress Notes (Signed)
Chief Complaint  Patient presents with  . Diabetes    fasting med check plus. (pt took Synthroid this am) And could not provide UA this morning will try before she leaves. Stopped taking her vitamin B12 supplement, would like to know if she should restart. Also would like to know if she could switch from Diovan to Losartan due to cost.  . Hypertension   Hypothyroidism--started on replacement 3 months ago, and she seems to notice that hair loss has slowed down.  Constipation also has improved some.  DM--She noticed that sugars were running higher since getting injections into ear for her hearing loss.  Sugars had been running high in the mornings (170's), but lower in the afternoons (120-130).  Last eye exam was November.  Hasn't checked sugars recently.  Denies polydipsia, polyuria, and denies hypoglycemia. Last A1c was 8.3 in April, around the time of the steroid injections.  Last steroid injection was prior to last visit.  HTN--Hasn't been checking BP elsewhere recently.  Denies headaches, dizziness, chest pain, edema (has some mild chronic swelling in L ankle, unchanged x years).  Hyperlipidemia--Compliant with medications, denies side effects, and tries to follow a lowfat, low cholesterol diet.  Last lipids were in April. H/o melanoma--no recurrence, doing well  Immunization History  Administered Date(s) Administered  . Pneumococcal Polysaccharide 12/27/2000, 07/27/2008  . Td 03/26/2005  . Tdap 03/05/2012  . Zoster 02/25/2011  get flu shots yearly Last Pap smear: 9/09 Last mammogram: 02/2012 Last colonoscopy: 8/06 Last DEXA: 4/13 Dentist:  Twice yearly Ophtho: yearly, last diabetic annual exam was in November, due again in August Exercise: walks 2 miles every day, 7 days/week  Past Medical History  Diagnosis Date  . Diabetes mellitus 2002  . Hypertension   . Pure hypercholesterolemia   . Melanoma 7/09    RLE with in-transit mets; s/p hyperthermic limb perfusion chemo and  excision; re-excision of recurrence 2011  . Osteoporosis   . Diabetic neuropathy   . 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 Baker's cyst of knee     R popliteal  . Pulmonary nodules     small, seen on CT 12/09  . Cholelithiasis 12/09    asymptomatic, noticed on CT 12/09  . Alopecia     anterior hair loss  . Diverticulosis of colon   . Anxiety and depression     related to work--resolved  . Metatarsal fracture 10/2009    L 5th--Dr. Althea Charon    Past Surgical History  Procedure Date  . Tonsillectomy and adenoidectomy   . Excision of melanoma 11/23/08    wide excision of melanoma and R inguinal sentinel LN mapping and  biopsy  . Excision of recurrent melanoma 2011    RLE  . Cataract extraction, bilateral 01/2011  . Tympanostomy tube placement 2013    L ear  (Dr. Suszanne Conners)    History   Social History  . Marital Status: Divorced    Spouse Name: N/A    Number of Children: 2  . Years of Education: N/A   Occupational History  . 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 Smoking status: Never Smoker   . Smokeless tobacco: Never Used  . Alcohol Use: Yes     4 oz wine 3-4 times a week  . Drug Use: No  . Sexually Active: Not on file   Other Topics Concern  . Not on file   Social  History Narrative   Daughter in [place] and son in [place]    Family History  Problem Relation Age of Onset  . Hypertension Mother   . Stroke Mother   . Arthritis Father   . Hemophilia Father   . Cancer Other     breast   Current Outpatient Prescriptions on File Prior to Visit  Medication Sig Dispense Refill  . aspirin 81 MG tablet Take 81 mg by mouth 2 (two) times daily.        . Calcium Carbonate-Vitamin D (CALCIUM + D) 600-200 MG-UNIT TABS Take 1 tablet by mouth daily.       . Cholecalciferol (VITAMIN D) 1000 UNITS capsule Take 2,000 Units by mouth daily.        . Coenzyme Q10 (CO Q 10) 100 MG CAPS Take 1 capsule  by mouth daily.      Marland Kitchen exenatide (BYETTA 10 MCG PEN) 10 MCG/0.04ML SOLN Inject 0.04 mLs (10 mcg total) into the skin 2 (two) times daily with a meal.  2.4 mL  0  . fish oil-omega-3 fatty acids 1000 MG capsule Take 2 g by mouth daily.        . magnesium gluconate (MAGONATE) 500 MG tablet Take 500 mg by mouth 2 (two) times daily.        . metFORMIN (GLUCOPHAGE) 1000 MG tablet Take 1 tablet (1,000 mg total) by mouth 2 (two) times daily with a meal.  180 tablet  1  . Multiple Vitamins-Minerals (MULTIVITAMIN WITH MINERALS) tablet Take 1 tablet by mouth daily.        Marland Kitchen SYNTHROID 25 MCG tablet Take 1 tablet (25 mcg total) by mouth daily.  30 tablet  4  . vitamin C (ASCORBIC ACID) 500 MG tablet Take 500 mg by mouth daily.        Marland Kitchen DISCONTD: exenatide (BYETTA 10 MCG PEN) 10 MCG/0.04ML SOLN Inject 0.04 mLs (10 mcg total) into the skin 2 (two) times daily with a meal.  7.2 mL  1  . DISCONTD: simvastatin (ZOCOR) 40 MG tablet Take 1 tablet (40 mg total) by mouth at bedtime.  90 tablet  1  . losartan (COZAAR) 100 MG tablet Take 1 tablet (100 mg total) by mouth daily.  30 tablet  2  Diovan 160mg  (not losartan) prior to visit today  No Known Allergies  ROS:  The patient denies anorexia, fever, weight changes, headaches,  vision changes, ear pain, sore throat, breast concerns, chest pain, palpitations, dizziness, syncope, dyspnea on exertion, cough, swelling, nausea, vomiting, diarrhea, constipation, abdominal pain, melena, hematochezia, indigestion/heartburn, hematuria, incontinence, dysuria, vaginal bleeding, discharge, odor or itch, genital lesions, joint pains, numbness, tingling, weakness, tremor, suspicious skin lesions, depression, anxiety, abnormal bleeding, or enlarged lymph nodes. 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 Suszanne Conners), with only slight improvement.  Some easy bruising and occasional constipation  PHYSICAL EXAM: BP 150/78  Pulse 72  Ht 5' (1.524 m)  Wt 161  lb (73.029 kg)  BMI 31.44 kg/m2 130/76 on repeat by MD, RA  General Appearance:    Alert, cooperative, no distress, appears stated age  Head:    Normocephalic, without obvious abnormality, atraumatic.  Thinning hair, but no areas of alopecia or skin rashes/lesions  Eyes:    PERRL, conjunctiva/corneas clear, EOM's intact, fundi    benign  Ears:    Normal TM's and external ear canals.  PE tube L ear.  Nose:   Nares normal, mucosa normal, no drainage or sinus  tenderness  Throat:   Lips, mucosa, and tongue normal; teeth and gums normal  Neck:   Supple, no lymphadenopathy;  thyroid:  no   enlargement/tenderness/nodules; no carotid   bruit or JVD  Back:    Spine nontender, no curvature, ROM normal, no CVA     tenderness  Lungs:     Clear to auscultation bilaterally without wheezes, rales or     ronchi; respirations unlabored  Chest Wall:    No tenderness or deformity   Heart:    Regular rate and rhythm, S1 and S2 normal, no murmur, rub   or gallop  Breast Exam:    No tenderness, masses, or nipple discharge or inversion.      No axillary lymphadenopathy  Abdomen:     Soft, non-tender, nondistended, normoactive bowel sounds,    no masses, no hepatosplenomegaly  Genitalia:    Normal external genitalia without lesions.  BUS and vagina normal; no cervical motion tenderness. No abnormal vaginal discharge.  Uterus and adnexa not enlarged, nontender, no masses.  Pap not performed (pt elects to defer until next year)  Rectal:    Normal tone, no masses or tenderness; guaiac negative stool  Extremities:   No clubbing, cyanosis or edema  Pulses:   2+ and symmetric all extremities  Skin:   Skin color, texture, turgor normal, no rashes or lesions. Bruising on abdomen (from Byetta injections) and arms.  Lymph nodes:   Cervical, supraclavicular, and axillary nodes normal  Neurologic:   CNII-XII intact, normal strength, sensation and gait; reflexes 2+ and symmetric throughout          Psych:   Normal mood,  affect, hygiene and grooming.    Lab Results  Component Value Date   HGBA1C 7.2 06/11/2012   ASSESSMENT/PLAN: 1. Essential hypertension, benign  losartan (COZAAR) 100 MG tablet, POCT Urinalysis Dipstick, Comprehensive metabolic panel  2. Pure hypercholesterolemia  simvastatin (ZOCOR) 40 MG tablet, Lipid panel, Comprehensive metabolic panel  3. Type II or unspecified type diabetes mellitus without mention of complication, not stated as uncontrolled  HgB A1c, Microalbumin / creatinine urine ratio, Hemoglobin A1c, Comprehensive metabolic panel   DM--improved since off steroids; still borderline.  Work on diet, exercise to try and reach goal of A1c <7 on current meds  HTN--change from Diovan to losartan for cost purposes.  Start checking BP elsewhere. Goal <130/80  Hyperlipidemia--labs due again in October Hypothyroidism--TSH due again in October  Discussed monthly self breast exams and yearly mammograms after the age of 80; at least 30 minutes of aerobic activity at least 5 days/week; proper sunscreen use reviewed; healthy diet, including goals of calcium and vitamin D intake and alcohol recommendations (less than or equal to 1 drink/day) reviewed; regular seatbelt use; changing batteries in smoke detectors.  Immunization recommendations discussed--UTD, get yearly flu shots.  Colonoscopy recommendations reviewed, UTD. Hemoccult kit given.  F/u in October for med check with fasting labs prior

## 2012-06-12 ENCOUNTER — Telehealth: Payer: Self-pay | Admitting: *Deleted

## 2012-06-12 NOTE — Telephone Encounter (Signed)
Patient called and wanted to let you know that she saw Dr.Teoh yesterday afternoon and her bp there was 122/68.

## 2012-07-14 DIAGNOSIS — Z961 Presence of intraocular lens: Secondary | ICD-10-CM | POA: Diagnosis not present

## 2012-07-14 DIAGNOSIS — E119 Type 2 diabetes mellitus without complications: Secondary | ICD-10-CM | POA: Diagnosis not present

## 2012-08-07 DIAGNOSIS — Z23 Encounter for immunization: Secondary | ICD-10-CM | POA: Diagnosis not present

## 2012-08-20 ENCOUNTER — Other Ambulatory Visit: Payer: Self-pay | Admitting: *Deleted

## 2012-08-27 ENCOUNTER — Other Ambulatory Visit: Payer: BLUE CROSS/BLUE SHIELD

## 2012-09-01 ENCOUNTER — Telehealth: Payer: Self-pay | Admitting: *Deleted

## 2012-09-01 NOTE — Telephone Encounter (Signed)
Advise pt that brand is preferred, if affordable, due to the consistency of getting the exact same amount of medication with the brand tablets, vs variability in the strength depending on the source of the generic prescription.  If she develops any thyroid related symptoms (fatigue, weight changes, hair/skin/bowel changes, etc.), then may need more frequent labs after changing rx to generic.  I only prefer brands for thyroid and blood thinning medications, but am willing to do generic if it isn't affordable for the patient

## 2012-09-01 NOTE — Telephone Encounter (Signed)
Patient called and would like to know if it is possible to switch her brand name Synthroid to generic levothyroxine? If possible she would like a 90 day supply sent to [facility] on [address] which I can do. Thanks.

## 2012-09-01 NOTE — Telephone Encounter (Signed)
Patient okay to remain on brand for now.

## 2012-09-15 ENCOUNTER — Other Ambulatory Visit: Payer: Medicare Other

## 2012-09-15 DIAGNOSIS — E119 Type 2 diabetes mellitus without complications: Secondary | ICD-10-CM | POA: Diagnosis not present

## 2012-09-15 DIAGNOSIS — E039 Hypothyroidism, unspecified: Secondary | ICD-10-CM | POA: Diagnosis not present

## 2012-09-15 DIAGNOSIS — I10 Essential (primary) hypertension: Secondary | ICD-10-CM | POA: Diagnosis not present

## 2012-09-15 DIAGNOSIS — E7800 Pure hypercholesterolemia, unspecified: Secondary | ICD-10-CM

## 2012-09-15 LAB — TSH: TSH: 3.58 u[IU]/mL (ref 0.350–4.500)

## 2012-09-15 LAB — COMPREHENSIVE METABOLIC PANEL
Albumin: 4.5 g/dL (ref 3.5–5.2)
BUN: 17 mg/dL (ref 6–23)
CO2: 31 mEq/L (ref 19–32)
Calcium: 10 mg/dL (ref 8.4–10.5)
Chloride: 99 mEq/L (ref 96–112)
Creat: 0.92 mg/dL (ref 0.50–1.10)
Glucose, Bld: 154 mg/dL — ABNORMAL HIGH (ref 70–99)

## 2012-09-15 LAB — LIPID PANEL
HDL: 51 mg/dL (ref >39)
LDL Cholesterol: 72 mg/dL (ref 0–99)

## 2012-09-15 LAB — HEMOGLOBIN A1C
Hgb A1c MFr Bld: 7.2 % — ABNORMAL HIGH (ref <5.7)
Mean Plasma Glucose: 160 mg/dL — ABNORMAL HIGH (ref <117)

## 2012-09-16 LAB — MICROALBUMIN / CREATININE URINE RATIO
Microalb Creat Ratio: 46.5 mg/g — ABNORMAL HIGH (ref 0.0–30.0)
Microalb, Ur: 2.25 mg/dL — ABNORMAL HIGH (ref 0.00–1.89)

## 2012-09-17 ENCOUNTER — Ambulatory Visit (INDEPENDENT_AMBULATORY_CARE_PROVIDER_SITE_OTHER): Payer: Medicare Other | Admitting: Family Medicine

## 2012-09-17 ENCOUNTER — Encounter: Payer: Self-pay | Admitting: Family Medicine

## 2012-09-17 VITALS — BP 122/80 | HR 64 | Ht 60.0 in | Wt 159.0 lb

## 2012-09-17 DIAGNOSIS — I10 Essential (primary) hypertension: Secondary | ICD-10-CM | POA: Diagnosis not present

## 2012-09-17 DIAGNOSIS — IMO0001 Reserved for inherently not codable concepts without codable children: Secondary | ICD-10-CM | POA: Diagnosis not present

## 2012-09-17 DIAGNOSIS — E7800 Pure hypercholesterolemia, unspecified: Secondary | ICD-10-CM

## 2012-09-17 DIAGNOSIS — R809 Proteinuria, unspecified: Secondary | ICD-10-CM

## 2012-09-17 DIAGNOSIS — M7918 Myalgia, other site: Secondary | ICD-10-CM

## 2012-09-17 DIAGNOSIS — E119 Type 2 diabetes mellitus without complications: Secondary | ICD-10-CM

## 2012-09-17 MED ORDER — ALOGLIPTIN-PIOGLITAZONE 25-15 MG PO TABS: 1.0000 | ORAL_TABLET | Freq: Every day | ORAL | Status: DC
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
## 2012-09-17 NOTE — Patient Instructions (Addendum)
  Lab Results  Component Value Date   TSH 3.580 09/15/2012   Urine microalbumin/Creatinine ratio 46.5 (normal is <30)  Lab Results  Component Value Date   HGBA1C 7.2* 09/15/2012   Lab Results  Component Value Date   CHOL 157 09/15/2012   HDL 51 09/15/2012   LDLCALC 72 09/15/2012   TRIG 168* 09/15/2012   CHOLHDL 3.1 09/15/2012     Chemistry      Component Value Date/Time   NA 139 09/15/2012 0904   K 4.3 09/15/2012 0904   CL 99 09/15/2012 0904   CO2 31 09/15/2012 0904   BUN 17 09/15/2012 0904   CREATININE 0.92 09/15/2012 0904      Component Value Date/Time   CALCIUM 10.0 09/15/2012 0904   CALCIUM 9.7 04/23/2012 0915   ALKPHOS 49 09/15/2012 0904   AST 27 09/15/2012 0904   ALT 26 09/15/2012 0904   BILITOT 0.6 09/15/2012 0904     Glucose 154  Stop the Byetta, and change over to Oseni--this medication contains 2 medications.  One is the generic of Actos, and the other is a medication that is sort of similar/replacing the Byetta.  Remember to do the stretches shown in the office at least twice daily, better if done after using heat (soaking in tub or using heating pad).

## 2012-09-29 ENCOUNTER — Telehealth: Payer: Self-pay | Admitting: Internal Medicine

## 2012-09-29 DIAGNOSIS — I10 Essential (primary) hypertension: Secondary | ICD-10-CM

## 2012-09-29 MED ORDER — LOSARTAN POTASSIUM 100 MG PO TABS: 100.0000 mg | ORAL_TABLET | Freq: Every day | ORAL | Status: DC

## 2012-09-29 NOTE — Telephone Encounter (Signed)
SENT IN [place]

## 2012-10-22 ENCOUNTER — Telehealth: Payer: Self-pay | Admitting: *Deleted

## 2012-10-22 DIAGNOSIS — I10 Essential (primary) hypertension: Secondary | ICD-10-CM | POA: Diagnosis not present

## 2012-10-22 DIAGNOSIS — E7800 Pure hypercholesterolemia, unspecified: Secondary | ICD-10-CM | POA: Diagnosis not present

## 2012-10-22 DIAGNOSIS — E119 Type 2 diabetes mellitus without complications: Secondary | ICD-10-CM

## 2012-10-22 DIAGNOSIS — Z0389 Encounter for observation for other suspected diseases and conditions ruled out: Secondary | ICD-10-CM | POA: Diagnosis not present

## 2012-10-22 DIAGNOSIS — C439 Malignant melanoma of skin, unspecified: Secondary | ICD-10-CM | POA: Diagnosis not present

## 2012-10-22 MED ORDER — EXENATIDE 10 MCG/0.04ML ~~LOC~~ SOPN: 10.0000 ug | PEN_INJECTOR | Freq: Two times a day (BID) | SUBCUTANEOUS | Status: DC

## 2012-10-22 MED ORDER — PIOGLITAZONE HCL 15 MG PO TABS: 15.0000 mg | ORAL_TABLET | Freq: Every day | ORAL | Status: DC

## 2012-10-22 NOTE — Telephone Encounter (Signed)
Patient called and left me a vm yesterday stating that you were wanting to start her on a medication called Oseni. She called her pharmacy([facility] and [facility]), [facility] would not even quote her a price, [facility] this medication will be $796.77 for a 90 day supply.

## 2012-10-22 NOTE — Telephone Encounter (Signed)
Called meds into [facility].

## 2012-10-22 NOTE — Telephone Encounter (Signed)
Left message for patient to return my call.

## 2012-10-22 NOTE — Telephone Encounter (Signed)
We discussed at visit that if this new med wasn't affordable, to go back to taking the Byetta, and start 15mg  pioglitazone once daily.  rx x 3 months, and have pt f/u in 3 months (already appears to be scheduled for Feb)

## 2012-12-01 ENCOUNTER — Telehealth: Payer: Self-pay | Admitting: *Deleted

## 2012-12-01 DIAGNOSIS — E039 Hypothyroidism, unspecified: Secondary | ICD-10-CM

## 2012-12-01 MED ORDER — SYNTHROID 25 MCG PO TABS: 25.0000 ug | ORAL_TABLET | Freq: Every day | ORAL | Status: DC

## 2012-12-01 NOTE — Telephone Encounter (Signed)
Error

## 2012-12-09 ENCOUNTER — Ambulatory Visit (INDEPENDENT_AMBULATORY_CARE_PROVIDER_SITE_OTHER): Payer: Medicare Other | Admitting: Medical

## 2012-12-09 ENCOUNTER — Encounter: Payer: Self-pay | Admitting: Medical

## 2012-12-09 VITALS — BP 130/80 | HR 76 | Temp 98.0°F | Resp 14 | Wt 165.0 lb

## 2012-12-09 DIAGNOSIS — I10 Essential (primary) hypertension: Secondary | ICD-10-CM | POA: Diagnosis not present

## 2012-12-09 DIAGNOSIS — R011 Cardiac murmur, unspecified: Secondary | ICD-10-CM | POA: Diagnosis not present

## 2012-12-09 NOTE — Progress Notes (Signed)
Subjective: Here for work in per request of dentist.  She normally sees Dr. Lynelle Doctor here.  This morning she awoke at 2am with bleeding wound inside her mouth.  She couldn't get this to stop bleeding so she went to the dentist first thing.  They checked her BP there at it was 206/107 initially, and this gradually improved.  However, they asked [place] to see her today because of the elevated BP.  They finally got the bleeding wound to stop.    She notes hx/o hypertension, compliant with her medication ,but has always has issues with white coat hypertension/elevated BPs when she goes to the doctor.  Even here at our office she notes that she will always sit and rest before we take her BP.  At home routinely gets SBP in 118-140 range and DBP in the 72-84 range.    She denies any other symptoms.  No chest pain, palpitations, edema, SOB, syncope, dizziness, nosebleeds, vision changes, changes in urine output.     She notes hx/o cardiac evaluation 5 years ago at [facility] , but none since.  This was due to murmur heard then.  No prior hx/o coronary intervention, CABG, stent, etc.   Past Medical History  Diagnosis Date  . Diabetes mellitus 2002  . Hypertension   . Pure hypercholesterolemia   . Melanoma 7/09    RLE with in-transit mets; s/p hyperthermic limb perfusion chemo and excision; re-excision of recurrence 2011  . Osteoporosis   . Diabetic neuropathy   . 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 Baker's cyst of knee     R popliteal  . Pulmonary nodules     small, seen on CT 12/09  . Cholelithiasis 12/09    asymptomatic, noticed on CT 12/09  . Alopecia     anterior hair loss  . Diverticulosis of colon   . Anxiety and depression     related to work--resolved  . Metatarsal fracture 10/2009    L 5th--Dr. Althea Charon   ROS as in subjective  Objecitve: Gen: wd, wn nad Skin: warm, dry Neck: supple, nontender, no bruits, no thyromegaly, no  mass Heart: RRR, 2/6, faint systolic sustained murmur heard best in both left and right upper sternal borders, otherwise normal S1, S2, no gallops, rubs, S3 or S4 Lungs: clear Pulses normal UE and LE Neuro: no obvious papilledema, CN2-12 intact, DTRs 2+   Adult ECG Report  Indication: HTN  Rate: 71 bpm  Rhythm: normal sinus rhythm  QRS Axis: 26 degrees  PR Interval:  QRS Duration: 82ms  QTc:  Conduction Disturbances: none  Other Abnormalities: Q III, possible ST elevation only in lead III  Patient's cardiac risk factors are: advanced age (older than 73 for men, 4 for women), diabetes mellitus, dyslipidemia and hypertension.  EKG comparison: none  Narrative Interpretation: questionable ST elevation, Q in III, otherwise normal EKG  Assessment: Encounter Diagnoses  Name Primary?  . Essential hypertension, benign Yes  . Heart murmur    Plan Reviewed 08/2012 labs with normal renal function although she does have microalbuminuria.  Also reviewed lipids, HgbA1C from 08/2012.  Reviewed case with Dr. Susann Givens, supervising physician.  Given EKG findings, murmur, will get recheck consult with cardiology.   She is asymptomatic currently, BP is stable and within normal at time of discharge.

## 2012-12-10 ENCOUNTER — Telehealth: Payer: Self-pay | Admitting: *Deleted

## 2012-12-10 NOTE — Telephone Encounter (Signed)
Patient called and stated that she was here to see Vincenza Hews on 12/09/12. He rec that she a cardiologist for heart murmur and abn EKG. Karren Burly scheduled her with Dr.Turner for 12/12/12-she wanted to run that by you and make sure that you were in agreement before she would go to the appointment. (I put her chart on your shelf with the OV, EKG and consult notes) please review. Thanks.

## 2012-12-10 NOTE — Telephone Encounter (Signed)
Patient advised and she agreed to see Dr.Turner 12/12/12 @ 2pm, records faxed.

## 2012-12-10 NOTE — Telephone Encounter (Signed)
I am not particularly concerned about her murmur if she truly had a work-up in the past.  Without prior EKG's to compare, it is hard to be completely sure or able to reassure her (the cardiologist will have prior ekgs for comparison.  I trust Shane's opinion, and the fact is, she has significant cardiac risk factors, that I think it is reasonable to see the cardiologist.  She may not need to continue there on a regular basis, if everything is found to be okay.

## 2012-12-12 DIAGNOSIS — R9431 Abnormal electrocardiogram [ECG] [EKG]: Secondary | ICD-10-CM | POA: Diagnosis not present

## 2012-12-12 DIAGNOSIS — R011 Cardiac murmur, unspecified: Secondary | ICD-10-CM | POA: Diagnosis not present

## 2012-12-12 DIAGNOSIS — I10 Essential (primary) hypertension: Secondary | ICD-10-CM | POA: Diagnosis not present

## 2012-12-14 ENCOUNTER — Other Ambulatory Visit: Payer: Self-pay | Admitting: Family Medicine

## 2012-12-17 DIAGNOSIS — H905 Unspecified sensorineural hearing loss: Secondary | ICD-10-CM | POA: Diagnosis not present

## 2012-12-17 DIAGNOSIS — H7200 Central perforation of tympanic membrane, unspecified ear: Secondary | ICD-10-CM | POA: Diagnosis not present

## 2012-12-22 DIAGNOSIS — R011 Cardiac murmur, unspecified: Secondary | ICD-10-CM | POA: Diagnosis not present

## 2013-01-01 ENCOUNTER — Encounter: Payer: Self-pay | Admitting: Family Medicine

## 2013-01-01 ENCOUNTER — Ambulatory Visit (INDEPENDENT_AMBULATORY_CARE_PROVIDER_SITE_OTHER): Payer: Medicare Other | Admitting: Family Medicine

## 2013-01-01 ENCOUNTER — Telehealth: Payer: Self-pay | Admitting: *Deleted

## 2013-01-01 VITALS — BP 144/74 | HR 76 | Ht 60.0 in | Wt 160.0 lb

## 2013-01-01 DIAGNOSIS — E7800 Pure hypercholesterolemia, unspecified: Secondary | ICD-10-CM | POA: Diagnosis not present

## 2013-01-01 DIAGNOSIS — I10 Essential (primary) hypertension: Secondary | ICD-10-CM

## 2013-01-01 DIAGNOSIS — E119 Type 2 diabetes mellitus without complications: Secondary | ICD-10-CM

## 2013-01-01 MED ORDER — ALOGLIPTIN-PIOGLITAZONE 25-15 MG PO TABS: 1.0000 | ORAL_TABLET | Freq: Every day | ORAL | Status: DC

## 2013-01-01 MED ORDER — METFORMIN HCL 1000 MG PO TABS: 1000.0000 mg | ORAL_TABLET | Freq: Two times a day (BID) | ORAL | Status: DC

## 2013-01-01 NOTE — Progress Notes (Signed)
Chief Complaint  Patient presents with  . Follow-up    non fasting follow up.Wants to talk to you about her baby aspirin, she is bruising a lot and really easily.   HPI:  HTN:  She saw Dr. Mayford Knife 2-3 weeks ago. Report is not available.  Patient reports she had an echocardiogram, which was normal.  No further f/u needed. Stress test was not recommended. BP's at home are running 128/79.  Denies headaches, dizziness, chest pain, edema, shortness of breath.  DM:  Sugars have been running 97-120.  No hypoglycemia, polydipsia or polyuria.  Has some neuropathy (tingling in feet>fingers, mild burn), not painful or bothersome.  She was changed to Oseni 25/15 at last visit, and felt good while taking it, sugars were improved, but was too expensive.  Instead, she was switched back to the Byetta, and added pioglitazone. Denies edema or other side effects.  Going on a trip with her sisters June-July for about a month.  She would like Oseni samples to take during her trip, as this is much easier than dealing with the refrigeration needed for Byetta for such a long trip, with extreme temperature changes.  She has fallen twice in the past year (tripped on cuff of her pants, hit L knee)--black and blue all the way from knee down to her toes.  Larey Seat again last month in the kitchen, and hit a box with her back.  Bruise got very large.  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  Denies nosebleeds.  Bruises easily on arms, legs, shoulders without realizing any significant trauma that causes it.  She would like to consider stopping the aspirin.  Past Medical History  Diagnosis Date  . Diabetes mellitus 2002  . Hypertension   . Pure hypercholesterolemia   . Melanoma 7/09    RLE with in-transit mets; s/p hyperthermic limb perfusion chemo and excision; re-excision of recurrence 2011  . Osteoporosis    . Diabetic neuropathy   . 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 Baker's cyst of knee     R popliteal  . Pulmonary nodules     small, seen on CT 12/09  . Cholelithiasis 12/09    asymptomatic, noticed on CT 12/09  . Alopecia     anterior hair loss  . Diverticulosis of colon   . Anxiety and depression     related to work--resolved  . Metatarsal fracture 10/2009    L 5th--Dr. Althea Charon   Past Surgical History  Procedure Date  . Tonsillectomy and adenoidectomy   . Excision of melanoma 11/23/08    wide excision of melanoma and R inguinal sentinel LN mapping and  biopsy  . Excision of recurrent melanoma 2011    RLE  . Cataract extraction, bilateral 01/2011  . Tympanostomy tube placement 2013    L ear  (Dr. Suszanne Conners)   History   Social History  . Marital Status: Divorced    Spouse Name: N/A    Number of Children: 2  . Years of Education: N/A   Occupational History  . 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 Smoking status: Never Smoker   . Smokeless tobacco: Never Used  . Alcohol Use: Yes     Comment: 4 oz wine 3-4 times a week  . Drug Use: No  .  Sexually Active: Not on file   Other Topics Concern  . Not on file   Social History Narrative   Daughter in [place] and son in [place]   Current outpatient prescriptions:aspirin 81 MG tablet, Take 81 mg by mouth 2 (two) times daily.  , Disp: , Rfl: ;  Calcium Carbonate-Vitamin D (CALCIUM + D) 600-200 MG-UNIT TABS, Take 1 tablet by mouth daily. , Disp: , Rfl: ;  Cholecalciferol (VITAMIN D) 1000 UNITS capsule, Take 2,000 Units by mouth daily.  , Disp: , Rfl: ;  Coenzyme Q10 (CO Q 10) 100 MG CAPS, Take 1 capsule by mouth daily., Disp: , Rfl:  exenatide (BYETTA 10 MCG PEN) 10 MCG/0.04ML SOLN, Inject 0.04 mLs (10 mcg total) into the skin 2 (two) times daily with a meal., Disp: 1.2 mL, Rfl: 2;  fish oil-omega-3 fatty acids 1000 MG capsule, Take 2 g by mouth daily.   , Disp: , Rfl: ;  losartan (COZAAR) 100 MG tablet, Take 1 tablet (100 mg total) by mouth daily., Disp: 30 tablet, Rfl: 5;  magnesium gluconate (MAGONATE) 500 MG tablet, Take 500 mg by mouth 2 (two) times daily.  , Disp: , Rfl:  metFORMIN (GLUCOPHAGE) 1000 MG tablet, Take 1 tablet (1,000 mg total) by mouth 2 (two) times daily with a meal., Disp: 180 tablet, Rfl: 1;  Multiple Vitamins-Minerals (MULTIVITAMIN WITH MINERALS) tablet, Take 1 tablet by mouth daily.  , Disp: , Rfl: ;  pioglitazone (ACTOS) 15 MG tablet, Take 1 tablet (15 mg total) by mouth daily., Disp: 30 tablet, Rfl: 2;  simvastatin (ZOCOR) 40 MG tablet, Take 1 tablet by mouth at  bedtime, Disp: 90 tablet, Rfl: 0 SYNTHROID 25 MCG tablet, Take 1 tablet (25 mcg total) by mouth daily., Disp: 30 tablet, Rfl: 4;  vitamin C (ASCORBIC ACID) 500 MG tablet, Take 500 mg by mouth daily.  , Disp: , Rfl:   No Known Allergies  ROS:  Denies fevers, URI symptoms, cough, shortness of breath, chest pain, headaches, dizziness, edema, skin rash.  Denies GI or GU complaints.  Denies depression/anxiety. Buttock pain has resolved. +easy bruising/bleeding  PHYSICAL EXAM: BP 148/80  Pulse 76  Ht 5' (1.524 m)  Wt 160 lb (72.576 kg)  BMI 31.25 kg/m2 144/74 on repeat by MD Well developed, pleasant female in no distress Neck: no lymphadenopathy, thyromegaly or mass Heart: regular rate and rhythm with 2/6 SEM at RUSB Lungs: clear bilaterally Abdomen: soft, nontender, no mass Extremities: no edema Neuro: alert and oriented.  Normal gait Psych: normal mood, affect, hygiene and grooming.  Lab Results  Component Value Date   HGBA1C 6.7 01/01/2013   ASSESSMENT/PLAN:  1. Type II or unspecified type diabetes mellitus without mention of complication, not stated as uncontrolled  HgB A1c, metFORMIN (GLUCOPHAGE) 1000 MG tablet, Alogliptin-Pioglitazone (OSENI) 25-15 MG TABS  2. Essential hypertension, benign    3. Pure hypercholesterolemia     Risks/benefits of  aspirin therapy reviewed.  She plans to try and cut back to qod first, before stopping entirely, so see if she has any improvement.  DM--improved control on current regimen.  Okay to use Oseni x 1 month while on trip, but otherwise maintain current regimen. To stop the pioglitazone, but continue the metformin while on Oseni while on trip.  This is a temporary change just for her travels (due to cost of med to continue longterm).  HTN--well controlled per home BP's   6 month med check, fasting

## 2013-01-01 NOTE — Telephone Encounter (Signed)
Called patient and her phone was busy. I was trying to tell her that Dr.Knapp said the June 9th appointment would be fine. I will try her again on Monday am.

## 2013-01-01 NOTE — Patient Instructions (Signed)
Keep up the good work

## 2013-01-23 ENCOUNTER — Encounter: Payer: Self-pay | Admitting: Family Medicine

## 2013-02-02 ENCOUNTER — Telehealth: Payer: Self-pay | Admitting: Family Medicine

## 2013-02-02 DIAGNOSIS — E119 Type 2 diabetes mellitus without complications: Secondary | ICD-10-CM

## 2013-02-02 MED ORDER — PIOGLITAZONE HCL 15 MG PO TABS: 15.0000 mg | ORAL_TABLET | Freq: Every day | ORAL | Status: DC

## 2013-02-02 MED ORDER — EXENATIDE 10 MCG/0.04ML ~~LOC~~ SOPN: 10.0000 ug | PEN_INJECTOR | Freq: Two times a day (BID) | SUBCUTANEOUS | Status: DC

## 2013-02-02 NOTE — Telephone Encounter (Signed)
Called into [facility].

## 2013-02-02 NOTE — Telephone Encounter (Signed)
Done

## 2013-02-02 NOTE — Telephone Encounter (Signed)
Okay to refill? 

## 2013-02-02 NOTE — Telephone Encounter (Signed)
Patient needs refill on needles for ultra fine pen mini  [facility]   [address]

## 2013-02-04 ENCOUNTER — Other Ambulatory Visit: Payer: Self-pay | Admitting: *Deleted

## 2013-02-04 DIAGNOSIS — E119 Type 2 diabetes mellitus without complications: Secondary | ICD-10-CM

## 2013-02-04 MED ORDER — EXENATIDE 10 MCG/0.04ML ~~LOC~~ SOPN: 10.0000 ug | PEN_INJECTOR | Freq: Two times a day (BID) | SUBCUTANEOUS | Status: DC

## 2013-02-23 ENCOUNTER — Other Ambulatory Visit: Payer: Self-pay | Admitting: *Deleted

## 2013-02-23 DIAGNOSIS — E039 Hypothyroidism, unspecified: Secondary | ICD-10-CM

## 2013-02-23 DIAGNOSIS — I10 Essential (primary) hypertension: Secondary | ICD-10-CM

## 2013-02-23 DIAGNOSIS — E119 Type 2 diabetes mellitus without complications: Secondary | ICD-10-CM

## 2013-02-23 MED ORDER — PIOGLITAZONE HCL 15 MG PO TABS: 15.0000 mg | ORAL_TABLET | Freq: Every day | ORAL | Status: DC

## 2013-02-23 MED ORDER — LOSARTAN POTASSIUM 100 MG PO TABS: 100.0000 mg | ORAL_TABLET | Freq: Every day | ORAL | Status: DC

## 2013-02-23 MED ORDER — SYNTHROID 25 MCG PO TABS: 25.0000 ug | ORAL_TABLET | Freq: Every day | ORAL | Status: DC

## 2013-02-23 MED ORDER — EXENATIDE 10 MCG/0.04ML ~~LOC~~ SOPN: 10.0000 ug | PEN_INJECTOR | Freq: Two times a day (BID) | SUBCUTANEOUS | Status: DC

## 2013-03-18 ENCOUNTER — Telehealth: Payer: Self-pay | Admitting: Family Medicine

## 2013-03-18 MED ORDER — SIMVASTATIN 40 MG PO TABS: 40.0000 mg | ORAL_TABLET | Freq: Every day | ORAL | Status: DC

## 2013-03-18 NOTE — Telephone Encounter (Signed)
Done

## 2013-04-23 ENCOUNTER — Telehealth: Payer: Self-pay | Admitting: Internal Medicine

## 2013-04-23 DIAGNOSIS — E119 Type 2 diabetes mellitus without complications: Secondary | ICD-10-CM

## 2013-04-23 DIAGNOSIS — E039 Hypothyroidism, unspecified: Secondary | ICD-10-CM

## 2013-04-23 DIAGNOSIS — I10 Essential (primary) hypertension: Secondary | ICD-10-CM

## 2013-04-23 MED ORDER — PIOGLITAZONE HCL 15 MG PO TABS: 15.0000 mg | ORAL_TABLET | Freq: Every day | ORAL | Status: DC

## 2013-04-23 MED ORDER — SIMVASTATIN 40 MG PO TABS: 40.0000 mg | ORAL_TABLET | Freq: Every day | ORAL | Status: DC

## 2013-04-23 MED ORDER — SYNTHROID 25 MCG PO TABS: 25.0000 ug | ORAL_TABLET | Freq: Every day | ORAL | Status: DC

## 2013-04-23 MED ORDER — LOSARTAN POTASSIUM 100 MG PO TABS: 100.0000 mg | ORAL_TABLET | Freq: Every day | ORAL | Status: DC

## 2013-04-23 NOTE — Telephone Encounter (Signed)
Pt needed a 90 day supply of losartan 100mg , synthyroid , and actos 15mg  for a trip that she is going on and will not be back until later part of July. i did accidentally send over simvastatin and i did call and cancel the order for that. Pt did not need that filled.

## 2013-05-04 ENCOUNTER — Encounter: Payer: Self-pay | Admitting: Family Medicine

## 2013-05-04 ENCOUNTER — Ambulatory Visit (INDEPENDENT_AMBULATORY_CARE_PROVIDER_SITE_OTHER): Payer: Medicare Other | Admitting: Family Medicine

## 2013-05-04 VITALS — BP 120/70 | HR 72 | Ht 60.0 in | Wt 161.0 lb

## 2013-05-04 DIAGNOSIS — R809 Proteinuria, unspecified: Secondary | ICD-10-CM

## 2013-05-04 DIAGNOSIS — E119 Type 2 diabetes mellitus without complications: Secondary | ICD-10-CM | POA: Diagnosis not present

## 2013-05-04 DIAGNOSIS — I10 Essential (primary) hypertension: Secondary | ICD-10-CM | POA: Diagnosis not present

## 2013-05-04 DIAGNOSIS — E7800 Pure hypercholesterolemia, unspecified: Secondary | ICD-10-CM | POA: Diagnosis not present

## 2013-05-04 LAB — POCT GLYCOSYLATED HEMOGLOBIN (HGB A1C): Hemoglobin A1C: 6.3

## 2013-05-04 NOTE — Progress Notes (Signed)
Chief Complaint  Patient presents with  . Diabetes    fasting med check.    Hypertension.  BP's run 128-132/70's-80 at home. Denies headaches, dizziness, chest pain, edema, shortness of breath.   DM: Sugars have been running 118 in morning, as low as 98 in the afternoons. No hypoglycemia, polydipsia or polyuria. Has some neuropathy (tingling in feet>fingers, mild burn), not painful or bothersome, unchanged/chronic.  Last eye exam was last August or September. She was changed to Oseni 25/15 at last visit, and felt good while taking it, sugars were improved, but was too expensive. Instead, she was switched back to the Byetta, and added pioglitazone. Denies edema or other side effects.  She is leaving for a trip with her sisters next week, going away for about a month. She already has Oseni samples to take during her trip--in place of both Byetta and pioglitzaone, as this is much easier than dealing with the refrigeration needed for Byetta for such a long trip, with extreme temperature changes.   Hyperlipidemia follow-up:  Patient is reportedly following a low-fat, low cholesterol diet.  Compliant with medications and denies medication side effects  Past due for mammogram, last done in April 2013  Past Medical History  Diagnosis Date  . Diabetes mellitus 2002  . Hypertension   . Pure hypercholesterolemia   . Melanoma 7/09    RLE with in-transit mets; s/p hyperthermic limb perfusion chemo and excision; re-excision of recurrence 2011  . Osteoporosis   . Diabetic neuropathy   . 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 Baker's cyst of knee     R popliteal  . Pulmonary nodules     small, seen on CT 12/09  . Cholelithiasis 12/09    asymptomatic, noticed on CT 12/09  . Alopecia     anterior hair loss  . Diverticulosis of colon   . Anxiety and depression     related to work--resolved  . Metatarsal fracture 10/2009    L 5th--Dr. Althea Charon   Past  Surgical History  Procedure Laterality Date  . Tonsillectomy and adenoidectomy    . Excision of melanoma  11/23/08    wide excision of melanoma and R inguinal sentinel LN mapping and  biopsy  . Excision of recurrent melanoma  2011    RLE  . Cataract extraction, bilateral  01/2011  . Tympanostomy tube placement  2013    L ear  (Dr. Suszanne Conners)   History   Social History  . Marital Status: Divorced    Spouse Name: N/A    Number of Children: 2  . Years of Education: N/A   Occupational History  . 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 Smoking status: Never Smoker   . Smokeless tobacco: Never Used  . Alcohol Use: Yes     Comment: 4 oz wine 3-4 times a week  . Drug Use: No  . Sexually Active: Not on file   Other Topics Concern  . Not on file   Social History Narrative   Daughter in [place] and son in [place]   Current outpatient prescriptions:aspirin 81 MG tablet, Take 81 mg by mouth every other day. , Disp: , Rfl: ;  Calcium Carbonate-Vitamin D (CALCIUM + D) 600-200 MG-UNIT TABS, Take 1 tablet by mouth daily. , Disp: , Rfl: ;  Cholecalciferol (VITAMIN D) 1000 UNITS capsule, Take 2,000 Units by mouth daily.  , Disp: , Rfl: ;  Coenzyme Q10 (CO Q 10) 100 MG CAPS, Take 1 capsule by mouth daily., Disp: , Rfl:  exenatide (BYETTA 10 MCG PEN) 10 MCG/0.04ML SOLN, Inject 0.04 mLs (10 mcg total) into the skin 2 (two) times daily with a meal., Disp: 2.4 mL, Rfl: 2;  fish oil-omega-3 fatty acids 1000 MG capsule, Take 2 g by mouth daily.  , Disp: , Rfl: ;  losartan (COZAAR) 100 MG tablet, Take 1 tablet (100 mg total) by mouth daily., Disp: 90 tablet, Rfl: 0;  magnesium gluconate (MAGONATE) 500 MG tablet, Take 500 mg by mouth 2 (two) times daily.  , Disp: , Rfl:  metFORMIN (GLUCOPHAGE) 1000 MG tablet, Take 1 tablet (1,000 mg total) by mouth 2 (two) times daily with a meal., Disp: 180 tablet, Rfl: 1;  Multiple Vitamins-Minerals (MULTIVITAMIN WITH MINERALS) tablet, Take 1 tablet  by mouth daily.  , Disp: , Rfl: ;  pioglitazone (ACTOS) 15 MG tablet, Take 1 tablet (15 mg total) by mouth daily., Disp: 90 tablet, Rfl: 0 simvastatin (ZOCOR) 40 MG tablet, Take 1 tablet (40 mg total) by mouth at bedtime., Disp: 90 tablet, Rfl: 0;  SYNTHROID 25 MCG tablet, Take 1 tablet (25 mcg total) by mouth daily., Disp: 90 tablet, Rfl: 0;  vitamin C (ASCORBIC ACID) 500 MG tablet, Take 500 mg by mouth daily.  , Disp: , Rfl:   No Known Allergies  ROS:  Denies fevers, URI symptoms, chest pain, headaches, dizziness, palpitations, nausea, vomiting, diarrhea, blood in stool, urinary complaint, skin rashes, bleeding/bruising, or other complaints except as per HPI  PHYSICAL EXAM: BP 120/70  Pulse 72  Ht 5' (1.524 m)  Wt 161 lb (73.029 kg)  BMI 31.44 kg/m2 Well developed, pleasant female in no distress  Neck: no lymphadenopathy, thyromegaly or mass  Heart: regular rate and rhythm with 2/6 SEM at RUSB  Lungs: clear bilaterally  Abdomen: soft, nontender, no mass  Extremities: trace edema bilaterally, 2+ pulse.  WHSS on RLE.  Neuro: alert and oriented. Normal gait  Psych: normal mood, affect, hygiene and grooming  Lab Results  Component Value Date   HGBA1C 6.3 05/04/2013   ASSESSMENT/PLAN:  Type II or unspecified type diabetes mellitus without mention of complication, not stated as uncontrolled - Plan: HgB A1c, Comprehensive metabolic panel, HM Diabetes Foot Exam  Pure hypercholesterolemia - Plan: Comprehensive metabolic panel, Lipid panel  Essential hypertension, benign - Plan: Comprehensive metabolic panel  Microalbuminuria - Plan: Microalbumin / creatinine urine ratio  F/u 6 months for CPE (medcheck plus AWV)

## 2013-05-04 NOTE — Patient Instructions (Addendum)
Continue your current medications. For your trip, stop both the pioglitazone and the byetta, and take the Oseni instead.  Upon return from your trip, resume your regular medications. Schedule your yearly eye exam.  Try and lose some weight Schedule your mammogram

## 2013-05-05 LAB — COMPREHENSIVE METABOLIC PANEL
AST: 22 U/L (ref 0–37)
Albumin: 4.1 g/dL (ref 3.5–5.2)
Alkaline Phosphatase: 45 U/L (ref 39–117)
BUN: 15 mg/dL (ref 6–23)
Glucose, Bld: 111 mg/dL — ABNORMAL HIGH (ref 70–99)
Potassium: 4.6 mEq/L (ref 3.5–5.3)
Total Bilirubin: 0.6 mg/dL (ref 0.3–1.2)

## 2013-05-05 LAB — LIPID PANEL
HDL: 54 mg/dL (ref >39)
LDL Cholesterol: 38 mg/dL (ref 0–99)
Total CHOL/HDL Ratio: 2 Ratio
Triglycerides: 88 mg/dL (ref <150)
VLDL: 18 mg/dL (ref 0–40)

## 2013-05-05 LAB — MICROALBUMIN / CREATININE URINE RATIO
Creatinine, Urine: 122.1 mg/dL
Microalb, Ur: 4.59 mg/dL — ABNORMAL HIGH (ref 0.00–1.89)

## 2013-06-25 ENCOUNTER — Other Ambulatory Visit: Payer: Self-pay | Admitting: *Deleted

## 2013-06-25 DIAGNOSIS — E119 Type 2 diabetes mellitus without complications: Secondary | ICD-10-CM

## 2013-06-25 MED ORDER — METFORMIN HCL 1000 MG PO TABS: 1000.0000 mg | ORAL_TABLET | Freq: Two times a day (BID) | ORAL | Status: DC

## 2013-06-25 MED ORDER — SIMVASTATIN 40 MG PO TABS: 40.0000 mg | ORAL_TABLET | Freq: Every day | ORAL | Status: DC

## 2013-07-06 DIAGNOSIS — H6980 Other specified disorders of Eustachian tube, unspecified ear: Secondary | ICD-10-CM | POA: Diagnosis not present

## 2013-07-06 DIAGNOSIS — H908 Mixed conductive and sensorineural hearing loss, unspecified: Secondary | ICD-10-CM | POA: Diagnosis not present

## 2013-07-06 DIAGNOSIS — H6120 Impacted cerumen, unspecified ear: Secondary | ICD-10-CM | POA: Diagnosis not present

## 2013-07-06 DIAGNOSIS — H7200 Central perforation of tympanic membrane, unspecified ear: Secondary | ICD-10-CM | POA: Diagnosis not present

## 2013-07-16 DIAGNOSIS — Z1231 Encounter for screening mammogram for malignant neoplasm of breast: Secondary | ICD-10-CM | POA: Diagnosis not present

## 2013-07-16 DIAGNOSIS — E119 Type 2 diabetes mellitus without complications: Secondary | ICD-10-CM | POA: Diagnosis not present

## 2013-07-16 DIAGNOSIS — Z961 Presence of intraocular lens: Secondary | ICD-10-CM | POA: Diagnosis not present

## 2013-07-21 DIAGNOSIS — H903 Sensorineural hearing loss, bilateral: Secondary | ICD-10-CM | POA: Diagnosis not present

## 2013-07-21 DIAGNOSIS — H66019 Acute suppurative otitis media with spontaneous rupture of ear drum, unspecified ear: Secondary | ICD-10-CM | POA: Diagnosis not present

## 2013-07-24 ENCOUNTER — Encounter: Payer: Self-pay | Admitting: Internal Medicine

## 2013-07-29 ENCOUNTER — Telehealth: Payer: Self-pay | Admitting: Internal Medicine

## 2013-07-29 DIAGNOSIS — I10 Essential (primary) hypertension: Secondary | ICD-10-CM

## 2013-07-29 MED ORDER — LOSARTAN POTASSIUM 100 MG PO TABS: 100.0000 mg | ORAL_TABLET | Freq: Every day | ORAL | Status: DC

## 2013-07-29 NOTE — Telephone Encounter (Signed)
Done

## 2013-07-29 NOTE — Telephone Encounter (Signed)
Pt wants a refill on losartan 100mg  #90 to [facility]

## 2013-08-03 ENCOUNTER — Other Ambulatory Visit: Payer: Self-pay | Admitting: Family Medicine

## 2013-08-03 DIAGNOSIS — E039 Hypothyroidism, unspecified: Secondary | ICD-10-CM

## 2013-08-03 MED ORDER — SYNTHROID 25 MCG PO TABS: 25.0000 ug | ORAL_TABLET | Freq: Every day | ORAL | Status: DC

## 2013-08-03 NOTE — Telephone Encounter (Signed)
Last TSH was October, but looks like med check is scheduled for December.  Okay to refill until med check, as I will do all labs together in December

## 2013-08-03 NOTE — Telephone Encounter (Signed)
Patient needs refills on synthroid 25 mcg (90 day supply) and she would like to have refills if possible

## 2013-08-25 DIAGNOSIS — Z23 Encounter for immunization: Secondary | ICD-10-CM | POA: Diagnosis not present

## 2013-09-22 DIAGNOSIS — H9319 Tinnitus, unspecified ear: Secondary | ICD-10-CM | POA: Diagnosis not present

## 2013-09-22 DIAGNOSIS — H6120 Impacted cerumen, unspecified ear: Secondary | ICD-10-CM | POA: Diagnosis not present

## 2013-09-22 DIAGNOSIS — H9120 Sudden idiopathic hearing loss, unspecified ear: Secondary | ICD-10-CM | POA: Diagnosis not present

## 2013-09-22 DIAGNOSIS — H903 Sensorineural hearing loss, bilateral: Secondary | ICD-10-CM | POA: Diagnosis not present

## 2013-10-07 ENCOUNTER — Other Ambulatory Visit: Payer: Self-pay | Admitting: *Deleted

## 2013-10-07 DIAGNOSIS — E119 Type 2 diabetes mellitus without complications: Secondary | ICD-10-CM

## 2013-10-24 ENCOUNTER — Other Ambulatory Visit: Payer: Self-pay | Admitting: Family Medicine

## 2013-10-26 ENCOUNTER — Other Ambulatory Visit: Payer: Self-pay | Admitting: *Deleted

## 2013-10-26 DIAGNOSIS — E119 Type 2 diabetes mellitus without complications: Secondary | ICD-10-CM

## 2013-10-26 DIAGNOSIS — E039 Hypothyroidism, unspecified: Secondary | ICD-10-CM

## 2013-10-26 MED ORDER — EXENATIDE 10 MCG/0.04ML ~~LOC~~ SOPN: 10.0000 ug | PEN_INJECTOR | Freq: Two times a day (BID) | SUBCUTANEOUS | Status: DC

## 2013-10-26 MED ORDER — LOSARTAN POTASSIUM 100 MG PO TABS: ORAL_TABLET | ORAL | Status: DC

## 2013-10-26 MED ORDER — SIMVASTATIN 40 MG PO TABS: 40.0000 mg | ORAL_TABLET | Freq: Every day | ORAL | Status: DC

## 2013-10-26 MED ORDER — SYNTHROID 25 MCG PO TABS: 25.0000 ug | ORAL_TABLET | Freq: Every day | ORAL | Status: DC

## 2013-10-28 DIAGNOSIS — Z0389 Encounter for observation for other suspected diseases and conditions ruled out: Secondary | ICD-10-CM | POA: Diagnosis not present

## 2013-10-28 DIAGNOSIS — C439 Malignant melanoma of skin, unspecified: Secondary | ICD-10-CM | POA: Diagnosis not present

## 2013-10-28 DIAGNOSIS — IMO0002 Reserved for concepts with insufficient information to code with codable children: Secondary | ICD-10-CM | POA: Diagnosis not present

## 2013-11-05 ENCOUNTER — Encounter: Payer: Self-pay | Admitting: Family Medicine

## 2013-11-05 ENCOUNTER — Other Ambulatory Visit (HOSPITAL_COMMUNITY)
Admission: RE | Admit: 2013-11-05 | Discharge: 2013-11-05 | Disposition: A | Discharge status: Home / Self Care | Payer: Medicare Other | Source: Ambulatory Visit | Attending: Family Medicine | Admitting: Family Medicine

## 2013-11-05 ENCOUNTER — Ambulatory Visit (INDEPENDENT_AMBULATORY_CARE_PROVIDER_SITE_OTHER): Payer: Medicare Other | Admitting: Family Medicine

## 2013-11-05 VITALS — BP 122/82 | HR 76 | Ht 60.0 in | Wt 166.0 lb

## 2013-11-05 DIAGNOSIS — E1149 Type 2 diabetes mellitus with other diabetic neurological complication: Secondary | ICD-10-CM

## 2013-11-05 DIAGNOSIS — I10 Essential (primary) hypertension: Secondary | ICD-10-CM

## 2013-11-05 DIAGNOSIS — E039 Hypothyroidism, unspecified: Secondary | ICD-10-CM | POA: Diagnosis not present

## 2013-11-05 DIAGNOSIS — R809 Proteinuria, unspecified: Secondary | ICD-10-CM | POA: Diagnosis not present

## 2013-11-05 DIAGNOSIS — Z1151 Encounter for screening for human papillomavirus (HPV): Secondary | ICD-10-CM | POA: Diagnosis not present

## 2013-11-05 DIAGNOSIS — Z124 Encounter for screening for malignant neoplasm of cervix: Secondary | ICD-10-CM | POA: Insufficient documentation

## 2013-11-05 DIAGNOSIS — E119 Type 2 diabetes mellitus without complications: Secondary | ICD-10-CM | POA: Diagnosis not present

## 2013-11-05 DIAGNOSIS — E7800 Pure hypercholesterolemia, unspecified: Secondary | ICD-10-CM | POA: Diagnosis not present

## 2013-11-05 DIAGNOSIS — Z01419 Encounter for gynecological examination (general) (routine) without abnormal findings: Secondary | ICD-10-CM | POA: Diagnosis not present

## 2013-11-05 DIAGNOSIS — Z0000 Encounter for general adult medical examination without abnormal findings: Secondary | ICD-10-CM

## 2013-11-05 LAB — LIPID PANEL
Cholesterol: 118 mg/dL (ref 0–200)
HDL: 61 mg/dL (ref >39)
Total CHOL/HDL Ratio: 1.9 Ratio
VLDL: 20 mg/dL (ref 0–40)

## 2013-11-05 LAB — COMPREHENSIVE METABOLIC PANEL
AST: 22 U/L (ref 0–37)
Alkaline Phosphatase: 50 U/L (ref 39–117)
BUN: 14 mg/dL (ref 6–23)
Glucose, Bld: 117 mg/dL — ABNORMAL HIGH (ref 70–99)
Sodium: 138 mEq/L (ref 135–145)
Total Bilirubin: 0.5 mg/dL (ref 0.3–1.2)
Total Protein: 7 g/dL (ref 6.0–8.3)

## 2013-11-05 LAB — HM PAP SMEAR

## 2013-11-05 LAB — HEMOGLOBIN A1C
Hgb A1c MFr Bld: 6.5 % — ABNORMAL HIGH (ref <5.7)
Mean Plasma Glucose: 140 mg/dL — ABNORMAL HIGH (ref <117)

## 2013-11-05 NOTE — Progress Notes (Signed)
Chief Complaint  Patient presents with  . Med check plus    fasting med check plus. No concerns. All meds reconciled.     Tonya Rogers is a 77 y.o. female who presents for follow up on her chronic medical problems (med check) as well as annual wellness visit.  She has no specific complaints/concerns.    Left ear was bleeding upon return from her trip to [place], at altitude.  She has been seeing ENT.  Hasn't been going to the gym since returning from her trip over the summer, just rarely.  The ear bled again in November, has an appointment next month (re-treated with ear drops previously rx'd).   Hypertension. BP's run 130/70's at home, not checking often. Denies headaches, dizziness, chest pain, edema, shortness of breath. Slight headache this week related to pressure/weather changes.  DM: Sugars have been running 95-140.  No hypoglycemia, polydipsia or polyuria. Has some neuropathy (tingling in feet>fingers, mild burn), not painful or bothersome, unchanged/chronic. Last eye exam was in September, needed add'l surgery (related to lens implant). She took Oseni 25/15 in place of Byetta and pioglitazone while on her trip, and sugars were good.  She is now back on her regular regimen.  She checks her feet regularly, no sores/concerns.  Hyperlipidemia follow-up: Patient is reportedly following a low-fat, low cholesterol diet. Compliant with medications and denies medication side effects   AWV: See scanned depression and ADL screen Other doctors caring for patient: Dentist: Dr. Darnelle Bos (who retired, now sees female partner there) Ophtho: Dr. Nile Riggs ENT:  Dr. Suszanne Conners Dr. Glennie Isle at [facility] for her melanoma Dermatologist:  Dr. Terri Piedra  End of Life: she has a living will and power of attorney.  Immunization History  Administered Date(s) Administered  . Influenza Split 08/25/2012, 08/26/2013  . Pneumococcal Polysaccharide-23 12/27/2000, 07/27/2008  . Td 03/26/2005  . Tdap  03/05/2012  . Zoster 02/25/2011   Last Pap smear: 07/2008 Last mammogram: 06/2013 Last colonoscopy: 8/06 Last DEXA: 02/2012--osteopenia Dentist: every 6 months Ophtho:  yearly Exercise:  Limited since the summer, related to eye surgery, ear issues.  Only sporadic exercise.  She is doing yardwork, but not going to the gym.  Past Medical History  Diagnosis Date  . Diabetes mellitus 2002  . Hypertension   . Pure hypercholesterolemia   . Melanoma 7/09    RLE with in-transit mets; s/p hyperthermic limb perfusion chemo and excision; re-excision of recurrence 2011  . Osteoporosis   . Diabetic neuropathy   . 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 Baker's cyst of knee     R popliteal  . Pulmonary nodules     small, seen on CT 12/09  . Cholelithiasis 12/09    asymptomatic, noticed on CT 12/09  . Alopecia     anterior hair loss  . Diverticulosis of colon   . Anxiety and depression     related to work--resolved  . Metatarsal fracture 10/2009    L 5th--Dr. Althea Charon    Past Surgical History  Procedure Laterality Date  . Tonsillectomy and adenoidectomy    . Excision of melanoma  11/23/08    wide excision of melanoma and R inguinal sentinel LN mapping and  biopsy  . Excision of recurrent melanoma  2011    RLE  . Cataract extraction, bilateral  01/2011  . Tympanostomy tube placement  2013    L ear  (Dr. Suszanne Conners)    History   Social History  .  Marital Status: Divorced    Spouse Name: N/A    Number of Children: 2  . Years of Education: N/A   Occupational History  . 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 Smoking status: Never Smoker   . Smokeless tobacco: Never Used  . Alcohol Use: Yes     Comment: 4 oz wine 3-4 times a week  . Drug Use: No  . Sexual Activity: Not on file   Other Topics Concern  . Not on file   Social History Narrative   Daughter in [place] and son in [place]    Family History   Problem Relation Age of Onset  . Hypertension Mother   . Stroke Mother   . Arthritis Father   . Hemophilia Father   . Cancer Other     breast    Current outpatient prescriptions:aspirin 81 MG tablet, Take 81 mg by mouth every other day. , Disp: , Rfl: ;  Calcium Carbonate-Vitamin D (CALCIUM + D) 600-200 MG-UNIT TABS, Take 1 tablet by mouth daily. , Disp: , Rfl: ;  Cholecalciferol (VITAMIN D) 1000 UNITS capsule, Take 2,000 Units by mouth daily.  , Disp: , Rfl: ;  Coenzyme Q10 (CO Q 10) 100 MG CAPS, Take 1 capsule by mouth daily., Disp: , Rfl:  exenatide (BYETTA 10 MCG PEN) 10 MCG/0.04ML SOPN injection, Inject 0.04 mLs (10 mcg total) into the skin 2 (two) times daily with a meal., Disp: 2.4 mL, Rfl: 0;  fish oil-omega-3 fatty acids 1000 MG capsule, Take 2 g by mouth daily.  , Disp: , Rfl: ;  losartan (COZAAR) 100 MG tablet, TAKE 1 TABLET BY MOUTH DAILY, Disp: 30 tablet, Rfl: 0;  magnesium gluconate (MAGONATE) 500 MG tablet, Take 500 mg by mouth 2 (two) times daily.  , Disp: , Rfl:  metFORMIN (GLUCOPHAGE) 1000 MG tablet, Take 1 tablet (1,000 mg total) by mouth 2 (two) times daily with a meal., Disp: 180 tablet, Rfl: 1;  Multiple Vitamins-Minerals (MULTIVITAMIN WITH MINERALS) tablet, Take 1 tablet by mouth daily.  , Disp: , Rfl: ;  pioglitazone (ACTOS) 15 MG tablet, Take 1 tablet (15 mg total) by mouth daily., Disp: 90 tablet, Rfl: 0 simvastatin (ZOCOR) 40 MG tablet, Take 1 tablet (40 mg total) by mouth at bedtime., Disp: 30 tablet, Rfl: 0;  SYNTHROID 25 MCG tablet, Take 1 tablet (25 mcg total) by mouth daily., Disp: 30 tablet, Rfl: 0;  vitamin C (ASCORBIC ACID) 500 MG tablet, Take 500 mg by mouth daily.  , Disp: , Rfl:   No Known Allergies  ROS:  The patient denies anorexia, fever, weight changes, headaches,  vision changes, ear pain, sore throat, breast concerns, chest pain, palpitations, dizziness, syncope, dyspnea on exertion, cough, swelling, nausea, vomiting, diarrhea, abdominal pain, melena,  hematochezia, indigestion/heartburn, hematuria, incontinence, dysuria,vaginal bleeding, discharge, odor or itch, genital lesions, joint pains, weakness, tremor, suspicious skin lesions, depression, anxiety, abnormal bleeding/bruising, or enlarged lymph nodes.  Constipation, unchanged/chronic Hearing loss L ear, unchanged. +tingling/mild burning in feet, hands  PHYSICAL EXAM: BP 122/82  Pulse 76  Ht 5' (1.524 m)  Wt 166 lb (75.297 kg)  BMI 32.42 kg/m2  General Appearance:    Alert, cooperative, no distress, appears stated age  Head:    Normocephalic, without obvious abnormality, atraumatic.  Thinning of hair anteriorly  Eyes:    PERRL, conjunctiva/corneas clear, EOM's intact, fundi    benign  Ears:    Normal TM's and external ear canals  Nose:  Nares normal, mucosa normal, no drainage or sinus   tenderness  Throat:   Lips, mucosa, and tongue normal; teeth and gums normal  Neck:   Supple, no lymphadenopathy;  thyroid:  no   enlargement/tenderness/nodules; no carotid   bruit or JVD  Back:    Spine nontender, no curvature, ROM normal, no CVA     tenderness  Lungs:     Clear to auscultation bilaterally without wheezes, rales or     ronchi; respirations unlabored  Chest Wall:    No tenderness or deformity   Heart:    Regular rate and rhythm, S1 and S2 normal, 2/6 systolic ejection murmur is present;  no rub or gallop  Breast Exam:    No tenderness, masses, or nipple discharge or inversion.      No axillary lymphadenopathy  Abdomen:     Soft, non-tender, nondistended, normoactive bowel sounds,    no masses, no hepatosplenomegaly. Some bruising/ecchymosis noted on abdomen at injection sites  Genitalia:    Normal external genitalia without lesions, mild atrophic changes noted.  BUS and vagina normal; cervix without lesions, or cervical motion tenderness.  Stenotic cervical os. No abnormal vaginal discharge.  Uterus and adnexa not enlarged, nontender, no masses.  Pap performed  Rectal:    Normal  tone, no masses or tenderness; guaiac negative stool  Extremities:   No clubbing, cyanosis or edema  Pulses:   2+ and symmetric all extremities  Skin:   Skin color, texture, turgor normal, no rashes or lesions.  Normal sensation to monofilament except at heels--decreased sensation at both heels  Lymph nodes:   Cervical, supraclavicular, and axillary nodes normal  Neurologic:   CNII-XII intact, normal strength, sensation and gait; reflexes 2+ and symmetric throughout          Psych:   Normal mood, affect, hygiene and grooming.      ASSESSMENT/PLAN:  Medicare annual wellness visit, initial  Essential hypertension, benign - controlled - Plan: Comprehensive metabolic panel  Microalbuminuria  Pure hypercholesterolemia - Plan: Lipid panel  Type II or unspecified type diabetes mellitus without mention of complication, not stated as uncontrolled - controlled - Plan: Comprehensive metabolic panel, Hemoglobin A1c  Unspecified hypothyroidism - euthyroid by history - Plan: TSH  Routine gynecological examination - Plan: Cytology - PAP  Type II or unspecified type diabetes mellitus with neurological manifestations, not stated as uncontrolled(250.60) - Plan: HM Diabetes Foot Exam  Discussed monthly self breast exams and yearly mammograms; at least 30 minutes of aerobic activity at least 5 days/week; proper sunscreen use reviewed; healthy diet, including goals of calcium and vitamin D intake and alcohol recommendations (less than or equal to 1 drink/day) reviewed; regular seatbelt use; changing batteries in smoke detectors.  Immunization recommendations discussed, UTD.  Colonoscopy recommendations reviewed, UTD, due again 2016. Hemoccult cards given  DEXA due again in spring 2015--order at next visit (2 years off bone-building meds)  MOST form completed; full code, full care.  Given add'l paperwork on Living Will and healthcare power of attorney per her request.  F/u 6 months, sooner prn

## 2013-11-05 NOTE — Patient Instructions (Signed)

## 2013-11-09 ENCOUNTER — Encounter: Payer: Self-pay | Admitting: *Deleted

## 2013-11-09 ENCOUNTER — Other Ambulatory Visit: Payer: Self-pay | Admitting: *Deleted

## 2013-11-09 ENCOUNTER — Encounter: Payer: Self-pay | Admitting: Family Medicine

## 2013-11-09 DIAGNOSIS — E7800 Pure hypercholesterolemia, unspecified: Secondary | ICD-10-CM

## 2013-12-02 ENCOUNTER — Other Ambulatory Visit: Payer: Self-pay | Admitting: *Deleted

## 2013-12-02 ENCOUNTER — Telehealth: Payer: Self-pay | Admitting: Internal Medicine

## 2013-12-02 DIAGNOSIS — E039 Hypothyroidism, unspecified: Secondary | ICD-10-CM

## 2013-12-02 DIAGNOSIS — E119 Type 2 diabetes mellitus without complications: Secondary | ICD-10-CM

## 2013-12-02 MED ORDER — PIOGLITAZONE HCL 15 MG PO TABS: 15.0000 mg | ORAL_TABLET | Freq: Every day | ORAL | Status: DC

## 2013-12-02 MED ORDER — SYNTHROID 25 MCG PO TABS: 25.0000 ug | ORAL_TABLET | Freq: Every day | ORAL | Status: DC

## 2013-12-02 MED ORDER — LOSARTAN POTASSIUM 100 MG PO TABS: ORAL_TABLET | ORAL | Status: DC

## 2013-12-02 NOTE — Telephone Encounter (Signed)
Pt needs a refill on synthroid 44mcg, pioglitazone 15mg , losartan 100mg  #90 to [facility]. Pt would liek 3 refills with it

## 2013-12-15 DIAGNOSIS — Z85820 Personal history of malignant melanoma of skin: Secondary | ICD-10-CM | POA: Diagnosis not present

## 2013-12-15 DIAGNOSIS — L570 Actinic keratosis: Secondary | ICD-10-CM | POA: Diagnosis not present

## 2013-12-23 DIAGNOSIS — H26499 Other secondary cataract, unspecified eye: Secondary | ICD-10-CM | POA: Diagnosis not present

## 2014-01-08 ENCOUNTER — Other Ambulatory Visit: Payer: Self-pay | Admitting: Family Medicine

## 2014-01-11 ENCOUNTER — Other Ambulatory Visit: Payer: Self-pay | Admitting: *Deleted

## 2014-01-11 MED ORDER — METFORMIN HCL 1000 MG PO TABS: ORAL_TABLET | ORAL | Status: DC

## 2014-01-28 ENCOUNTER — Other Ambulatory Visit: Payer: Self-pay | Admitting: *Deleted

## 2014-01-28 DIAGNOSIS — E119 Type 2 diabetes mellitus without complications: Secondary | ICD-10-CM

## 2014-01-28 MED ORDER — EXENATIDE 10 MCG/0.04ML ~~LOC~~ SOPN: 10.0000 ug | PEN_INJECTOR | Freq: Two times a day (BID) | SUBCUTANEOUS | Status: DC

## 2014-02-08 ENCOUNTER — Telehealth: Payer: Self-pay | Admitting: Internal Medicine

## 2014-02-08 ENCOUNTER — Other Ambulatory Visit: Payer: BLUE CROSS/BLUE SHIELD

## 2014-02-08 DIAGNOSIS — E7800 Pure hypercholesterolemia, unspecified: Secondary | ICD-10-CM

## 2014-02-08 MED ORDER — SIMVASTATIN 20 MG PO TABS: 20.0000 mg | ORAL_TABLET | Freq: Every day | ORAL | Status: DC

## 2014-02-08 NOTE — Telephone Encounter (Signed)
Pt will be out of her simvastatin 20mg  tomorrow and needs a refill. Send to [facility]

## 2014-02-08 NOTE — Telephone Encounter (Signed)
Done and patient notifed

## 2014-02-09 LAB — LIPID PANEL
CHOL/HDL RATIO: 2.4 ratio
CHOLESTEROL: 133 mg/dL (ref 0–200)
HDL: 55 mg/dL (ref >39)
LDL Cholesterol: 43 mg/dL (ref 0–99)
TRIGLYCERIDES: 174 mg/dL — AB (ref <150)
VLDL: 35 mg/dL (ref 0–40)

## 2014-03-08 ENCOUNTER — Other Ambulatory Visit: Payer: Self-pay | Admitting: *Deleted

## 2014-03-08 DIAGNOSIS — E119 Type 2 diabetes mellitus without complications: Secondary | ICD-10-CM

## 2014-03-08 MED ORDER — INSULIN PEN NEEDLE 31G X 5 MM MISC: 1.0000 | Freq: Every day | Status: DC

## 2014-03-08 MED ORDER — EXENATIDE 10 MCG/0.04ML ~~LOC~~ SOPN: 10.0000 ug | PEN_INJECTOR | Freq: Two times a day (BID) | SUBCUTANEOUS | Status: DC

## 2014-03-12 ENCOUNTER — Telehealth: Payer: Self-pay | Admitting: Family Medicine

## 2014-03-12 MED ORDER — SIMVASTATIN 20 MG PO TABS: 20.0000 mg | ORAL_TABLET | Freq: Every day | ORAL | Status: DC

## 2014-03-12 NOTE — Telephone Encounter (Signed)
done

## 2014-03-31 DIAGNOSIS — H905 Unspecified sensorineural hearing loss: Secondary | ICD-10-CM | POA: Diagnosis not present

## 2014-03-31 DIAGNOSIS — H9319 Tinnitus, unspecified ear: Secondary | ICD-10-CM | POA: Diagnosis not present

## 2014-03-31 DIAGNOSIS — H6120 Impacted cerumen, unspecified ear: Secondary | ICD-10-CM | POA: Diagnosis not present

## 2014-05-05 ENCOUNTER — Other Ambulatory Visit: Payer: Self-pay | Admitting: *Deleted

## 2014-05-05 MED ORDER — SIMVASTATIN 20 MG PO TABS: 20.0000 mg | ORAL_TABLET | Freq: Every day | ORAL | Status: DC

## 2014-05-18 DIAGNOSIS — M949 Disorder of cartilage, unspecified: Secondary | ICD-10-CM | POA: Diagnosis not present

## 2014-05-18 DIAGNOSIS — M899 Disorder of bone, unspecified: Secondary | ICD-10-CM | POA: Diagnosis not present

## 2014-05-18 LAB — HM DEXA SCAN

## 2014-05-27 ENCOUNTER — Encounter: Payer: Self-pay | Admitting: Family Medicine

## 2014-05-27 ENCOUNTER — Ambulatory Visit (INDEPENDENT_AMBULATORY_CARE_PROVIDER_SITE_OTHER): Payer: Medicare Other | Admitting: Family Medicine

## 2014-05-27 VITALS — BP 120/70 | HR 68 | Ht 60.0 in | Wt 167.0 lb

## 2014-05-27 DIAGNOSIS — L608 Other nail disorders: Secondary | ICD-10-CM

## 2014-05-27 DIAGNOSIS — E1149 Type 2 diabetes mellitus with other diabetic neurological complication: Secondary | ICD-10-CM | POA: Diagnosis not present

## 2014-05-27 DIAGNOSIS — M899 Disorder of bone, unspecified: Secondary | ICD-10-CM | POA: Diagnosis not present

## 2014-05-27 DIAGNOSIS — R809 Proteinuria, unspecified: Secondary | ICD-10-CM

## 2014-05-27 DIAGNOSIS — L603 Nail dystrophy: Secondary | ICD-10-CM

## 2014-05-27 DIAGNOSIS — E039 Hypothyroidism, unspecified: Secondary | ICD-10-CM | POA: Diagnosis not present

## 2014-05-27 DIAGNOSIS — E119 Type 2 diabetes mellitus without complications: Secondary | ICD-10-CM

## 2014-05-27 DIAGNOSIS — Z85820 Personal history of malignant melanoma of skin: Secondary | ICD-10-CM | POA: Diagnosis not present

## 2014-05-27 DIAGNOSIS — E7800 Pure hypercholesterolemia, unspecified: Secondary | ICD-10-CM

## 2014-05-27 DIAGNOSIS — I10 Essential (primary) hypertension: Secondary | ICD-10-CM

## 2014-05-27 DIAGNOSIS — M8580 Other specified disorders of bone density and structure, unspecified site: Secondary | ICD-10-CM

## 2014-05-27 DIAGNOSIS — M949 Disorder of cartilage, unspecified: Secondary | ICD-10-CM

## 2014-05-27 LAB — GLUCOSE, RANDOM: Glucose, Bld: 125 mg/dL — ABNORMAL HIGH (ref 70–99)

## 2014-05-27 LAB — LIPID PANEL
CHOL/HDL RATIO: 2.2 ratio
Cholesterol: 125 mg/dL (ref 0–200)
HDL: 56 mg/dL (ref >39)
LDL CALC: 50 mg/dL (ref 0–99)
Triglycerides: 93 mg/dL (ref <150)
VLDL: 19 mg/dL (ref 0–40)

## 2014-05-27 LAB — POCT GLYCOSYLATED HEMOGLOBIN (HGB A1C): Hemoglobin A1C: 6.2

## 2014-05-27 LAB — TSH: TSH: 3.271 u[IU]/mL (ref 0.350–4.500)

## 2014-05-27 MED ORDER — PIOGLITAZONE HCL 15 MG PO TABS: 15.0000 mg | ORAL_TABLET | Freq: Every day | ORAL | Status: DC

## 2014-05-27 MED ORDER — METFORMIN HCL 1000 MG PO TABS: ORAL_TABLET | ORAL | Status: DC

## 2014-05-27 MED ORDER — EXENATIDE 10 MCG/0.04ML ~~LOC~~ SOPN: 10.0000 ug | PEN_INJECTOR | Freq: Two times a day (BID) | SUBCUTANEOUS | Status: DC

## 2014-05-27 NOTE — Patient Instructions (Signed)
Please call Dr. Allyson Sabal to get in as soon as possible for evaluation (and excision) of lesion on your right leg--I am concerned about recurrent melanoma.  Continue all of your current medications.

## 2014-05-27 NOTE — Progress Notes (Signed)
Chief Complaint  Patient presents with  . Diabetes    fasting med check. Would like you to take a peek at the nails on her right hand only, splitting and peeling with rough ridges.     Hyperlipidemia--she had her simvastatin dose decreased to 20mg  after her last visit.  Recheck 3 months later showed that LDL remained quite low, but TG were >150.  She has since increased her fish oil to 3000mg  every day.  Denies any side effects to the medications, and is following a lowfat, low cholesterol diet.  Hypertension follow-up:  Blood pressures elsewhere are 119-135/65-80.  Denies dizziness, headaches, chest pain.  Denies side effects of medications.  DM: She has not been checking her blood sugars recently. No hypoglycemia, polydipsia or polyuria. Has some neuropathy (tingling in feet>fingers, mild burn), not painful or bothersome, unchanged/chronic. Last eye exam was in January, and due again later this month.  She checks her feet regularly, no sores/concerns.  She plans to call Dr. Allyson Sabal to evaluate a new lesion she noted on her R leg, below the knee. She first noticed something there in January, thought it was a scab, had been keeping an eye on it, but has noticed that it has grown. Not bleeding.  She has h/o melanoma in that same leg.  She has noticed some changes to the nails on her right hand.  She recalls this happening the same time last year, and self resolved, just grew out.  No pain, just getting brittle, splitting.  Past Medical History  Diagnosis Date  . Diabetes mellitus 2002  . Hypertension   . Pure hypercholesterolemia   . Melanoma 7/09    RLE with in-transit mets; s/p hyperthermic limb perfusion chemo and excision; re-excision of recurrence 2011  . Osteoporosis   . Diabetic neuropathy   . 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 Baker's cyst of knee     R popliteal  . Pulmonary nodules     small, seen on CT 12/09  .  Cholelithiasis 12/09    asymptomatic, noticed on CT 12/09  . Alopecia     anterior hair loss  . Diverticulosis of colon   . Anxiety and depression     related to work--resolved  . Metatarsal fracture 10/2009    L 5th--Dr. Rip Harbour   Past Surgical History  Procedure Laterality Date  . Tonsillectomy and adenoidectomy    . Excision of melanoma  11/23/08    wide excision of melanoma and R inguinal sentinel LN mapping and  biopsy  . Excision of recurrent melanoma  2011    RLE  . Cataract extraction, bilateral  01/2011  . Tympanostomy tube placement  2013    L ear  (Dr. Benjamine Mola)   History   Social History  . Marital Status: Divorced    Spouse Name: N/A    Number of Children: 2  . Years of Education: N/A   Occupational History  . 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 Smoking status: Never Smoker   . Smokeless tobacco: Never Used  . Alcohol Use: Yes     Comment: 4 oz wine 3-4 times a week  . Drug Use: No  . Sexual Activity: Not on file   Other Topics Concern  . Not on file   Social History Narrative   Daughter in [place] and son in [facility]   Outpatient Encounter Prescriptions as of 05/27/2014  Medication Sig Note  . aspirin 81 MG tablet Take 81 mg by mouth every other day.  05/27/2014: Only takes 2-3x/week due to bleeding  . Calcium Carbonate-Vitamin D (CALCIUM + D) 600-200 MG-UNIT TABS Take 1 tablet by mouth daily.    . Cholecalciferol (VITAMIN D) 1000 UNITS capsule Take 2,000 Units by mouth daily.     . Coenzyme Q10 (CO Q 10) 100 MG CAPS Take 1 capsule by mouth daily.   Marland Kitchen exenatide (BYETTA 10 MCG PEN) 10 MCG/0.04ML SOPN injection Inject 0.04 mLs (10 mcg total) into the skin 2 (two) times daily with a meal.   . fish oil-omega-3 fatty acids 1000 MG capsule Take 3 g by mouth daily.    . Insulin Pen Needle 31G X 5 MM MISC 1-2 each by Does not apply route daily.   Marland Kitchen losartan (COZAAR) 100 MG tablet TAKE 1 TABLET BY MOUTH DAILY   . magnesium gluconate  (MAGONATE) 500 MG tablet Take 500 mg by mouth 2 (two) times daily.     . metFORMIN (GLUCOPHAGE) 1000 MG tablet Take 1 tablet (1,000 mg  total) by mouth 2 (two)  times daily with a meal.   . Multiple Vitamins-Minerals (MULTIVITAMIN WITH MINERALS) tablet Take 1 tablet by mouth daily.     . pioglitazone (ACTOS) 15 MG tablet Take 1 tablet (15 mg total) by mouth daily.   . simvastatin (ZOCOR) 20 MG tablet Take 1 tablet (20 mg total) by mouth daily.   Marland Kitchen SYNTHROID 25 MCG tablet Take 1 tablet (25 mcg total) by mouth daily.   . vitamin C (ASCORBIC ACID) 500 MG tablet Take 500 mg by mouth daily.      No Known Allergies  ROS:  Denies fevers, chills, URI symptoms, cough, shortness of breath, chest pain, dizziness/syncope.  She has mild burning in her feet which is tolerable.  She denies nausea, vomiting, bowel changes, bleeding, bruising, rashes.  +new lesion on RLE as per HPI. +brittle nails on R hand per HPI. Got hearing aid for left ear  PHYSICAL EXAM: BP 120/70  Pulse 68  Ht 5' (1.524 m)  Wt 167 lb (75.751 kg)  BMI 32.62 kg/m2  Well developed, pleasant female in no distress  Neck: no lymphadenopathy, thyromegaly or mass, no carotid bruit Heart: regular rate and rhythm with 2/6 SEM at RUSB  Lungs: clear bilaterally  Abdomen: soft, nontender, no mass  Extremities: no edema, 2+ pulses. WHSS on RLE.  Atypical appearing raised lesion below right knee (8x28mm total shape)--there are three areas that are darker, circular, overlying a more oval shaped, lighter color lesion that is also somewhat raised.  Shape and coloration are abnormal Distal nails on right hand--horizontal ridges, somewhat flaky/brittle Neuro: alert and oriented. Normal gait  Psych: normal mood, affect, hygiene and grooming  DEXA results-- T-2.4 (bilat fem necks), unchanged from 02/2012.  Lab Results  Component Value Date   HGBA1C 6.2 05/27/2014   ASSESSMENT/PLAN:  Type II or unspecified type diabetes mellitus without mention of  complication, not stated as uncontrolled - Plan: HgB A1c, Glucose, random, Microalbumin / creatinine urine ratio, pioglitazone (ACTOS) 15 MG tablet, metFORMIN (GLUCOPHAGE) 1000 MG tablet, exenatide (BYETTA 10 MCG PEN) 10 MCG/0.04ML SOPN injection  Pure hypercholesterolemia - Plan: Lipid panel  Essential hypertension, benign  Microalbuminuria  Unspecified hypothyroidism - Plan: TSH  Type II or unspecified type diabetes mellitus with neurological manifestations, not stated as uncontrolled(250.60)  Brittle nails - Plan: TSH  Personal history of malignant melanoma - with concerning lesion  on RLE.  Will be scheduled to see Dr. Allyson Sabal ASAP  Osteopenia - intolerant of alendronate in past.  unchanged from DEXA 2 years ago.  continue Ca, Vit D, weight bearing exercise and recheck in 2 years  F/u ASAP with Dr. Allyson Sabal regarding abnormal lesion RLE, which looks like it may be a recurrence of melanoma.  Brittle nails on R only--discuss with Dr. Allyson Sabal. Check TSH again.  Osteopenia--didn't tolerate meds in past (problems with jaw). Unchanged.  Recheck in 2 years.  Reviewed recommendations for weight-bearing exercising, calcium and vitamin D   Glucose Lipids TSH Urine microalb  F/u 6 months for CPE/med check

## 2014-05-28 ENCOUNTER — Encounter: Payer: Self-pay | Admitting: Family Medicine

## 2014-05-28 DIAGNOSIS — M8580 Other specified disorders of bone density and structure, unspecified site: Secondary | ICD-10-CM | POA: Insufficient documentation

## 2014-05-28 DIAGNOSIS — Z85820 Personal history of malignant melanoma of skin: Secondary | ICD-10-CM | POA: Insufficient documentation

## 2014-05-28 LAB — MICROALBUMIN / CREATININE URINE RATIO
CREATININE, URINE: 148.4 mg/dL
MICROALB UR: 12.11 mg/dL — AB (ref 0.00–1.89)
Microalb Creat Ratio: 81.6 mg/g — ABNORMAL HIGH (ref 0.0–30.0)

## 2014-05-31 ENCOUNTER — Encounter: Payer: Self-pay | Admitting: Internal Medicine

## 2014-06-01 ENCOUNTER — Other Ambulatory Visit: Payer: Self-pay | Admitting: Dermatology

## 2014-06-01 DIAGNOSIS — C4370 Malignant melanoma of unspecified lower limb, including hip: Secondary | ICD-10-CM | POA: Diagnosis not present

## 2014-06-01 DIAGNOSIS — D485 Neoplasm of uncertain behavior of skin: Secondary | ICD-10-CM | POA: Diagnosis not present

## 2014-06-07 ENCOUNTER — Other Ambulatory Visit: Payer: Self-pay | Admitting: *Deleted

## 2014-06-07 ENCOUNTER — Telehealth: Payer: Self-pay | Admitting: *Deleted

## 2014-06-07 NOTE — Telephone Encounter (Signed)
Patient called to say thank you for sending her to Dr.Lupton-malignant melanoma(see lab section in EPIC). She is scheduled to see Dr.Levine 06/25/14. Just an FYI.

## 2014-06-07 NOTE — Telephone Encounter (Signed)
noted 

## 2014-06-25 DIAGNOSIS — C4370 Malignant melanoma of unspecified lower limb, including hip: Secondary | ICD-10-CM | POA: Diagnosis not present

## 2014-06-25 DIAGNOSIS — I10 Essential (primary) hypertension: Secondary | ICD-10-CM | POA: Diagnosis not present

## 2014-06-25 DIAGNOSIS — Z85820 Personal history of malignant melanoma of skin: Secondary | ICD-10-CM | POA: Diagnosis not present

## 2014-06-25 DIAGNOSIS — C792 Secondary malignant neoplasm of skin: Secondary | ICD-10-CM | POA: Diagnosis not present

## 2014-06-28 ENCOUNTER — Telehealth: Payer: Self-pay | Admitting: Family Medicine

## 2014-06-28 DIAGNOSIS — Z79899 Other long term (current) drug therapy: Secondary | ICD-10-CM | POA: Diagnosis not present

## 2014-06-28 DIAGNOSIS — E119 Type 2 diabetes mellitus without complications: Secondary | ICD-10-CM | POA: Diagnosis not present

## 2014-06-28 DIAGNOSIS — E039 Hypothyroidism, unspecified: Secondary | ICD-10-CM

## 2014-06-28 DIAGNOSIS — C4370 Malignant melanoma of unspecified lower limb, including hip: Secondary | ICD-10-CM | POA: Diagnosis not present

## 2014-06-28 DIAGNOSIS — Z9889 Other specified postprocedural states: Secondary | ICD-10-CM | POA: Diagnosis not present

## 2014-06-28 DIAGNOSIS — R9431 Abnormal electrocardiogram [ECG] [EKG]: Secondary | ICD-10-CM | POA: Diagnosis not present

## 2014-06-28 DIAGNOSIS — I10 Essential (primary) hypertension: Secondary | ICD-10-CM | POA: Diagnosis not present

## 2014-06-28 DIAGNOSIS — Z01818 Encounter for other preprocedural examination: Secondary | ICD-10-CM | POA: Diagnosis not present

## 2014-06-28 MED ORDER — SYNTHROID 25 MCG PO TABS: 25.0000 ug | ORAL_TABLET | Freq: Every day | ORAL | Status: DC

## 2014-06-28 NOTE — Telephone Encounter (Signed)
refilled 

## 2014-06-29 DIAGNOSIS — C439 Malignant melanoma of skin, unspecified: Secondary | ICD-10-CM | POA: Diagnosis not present

## 2014-06-29 DIAGNOSIS — I2510 Atherosclerotic heart disease of native coronary artery without angina pectoris: Secondary | ICD-10-CM | POA: Diagnosis not present

## 2014-06-29 DIAGNOSIS — C4370 Malignant melanoma of unspecified lower limb, including hip: Secondary | ICD-10-CM | POA: Diagnosis not present

## 2014-07-06 DIAGNOSIS — I10 Essential (primary) hypertension: Secondary | ICD-10-CM | POA: Diagnosis not present

## 2014-07-06 DIAGNOSIS — E039 Hypothyroidism, unspecified: Secondary | ICD-10-CM | POA: Diagnosis not present

## 2014-07-06 DIAGNOSIS — E119 Type 2 diabetes mellitus without complications: Secondary | ICD-10-CM | POA: Diagnosis not present

## 2014-07-06 DIAGNOSIS — C4370 Malignant melanoma of unspecified lower limb, including hip: Secondary | ICD-10-CM | POA: Diagnosis not present

## 2014-07-06 DIAGNOSIS — C439 Malignant melanoma of skin, unspecified: Secondary | ICD-10-CM | POA: Diagnosis not present

## 2014-07-28 DIAGNOSIS — C4370 Malignant melanoma of unspecified lower limb, including hip: Secondary | ICD-10-CM | POA: Diagnosis not present

## 2014-08-04 DIAGNOSIS — C4370 Malignant melanoma of unspecified lower limb, including hip: Secondary | ICD-10-CM | POA: Diagnosis not present

## 2014-08-05 DIAGNOSIS — Z961 Presence of intraocular lens: Secondary | ICD-10-CM | POA: Diagnosis not present

## 2014-08-05 DIAGNOSIS — E119 Type 2 diabetes mellitus without complications: Secondary | ICD-10-CM | POA: Diagnosis not present

## 2014-08-06 ENCOUNTER — Other Ambulatory Visit: Payer: Self-pay | Admitting: Family Medicine

## 2014-08-26 DIAGNOSIS — Z23 Encounter for immunization: Secondary | ICD-10-CM | POA: Diagnosis not present

## 2014-09-06 DIAGNOSIS — C439 Malignant melanoma of skin, unspecified: Secondary | ICD-10-CM | POA: Diagnosis not present

## 2014-09-08 DIAGNOSIS — Z23 Encounter for immunization: Secondary | ICD-10-CM | POA: Diagnosis not present

## 2014-09-08 DIAGNOSIS — Z1231 Encounter for screening mammogram for malignant neoplasm of breast: Secondary | ICD-10-CM | POA: Diagnosis not present

## 2014-09-08 LAB — HM MAMMOGRAPHY: HM MAMMO: NEGATIVE

## 2014-09-10 ENCOUNTER — Encounter: Payer: Self-pay | Admitting: *Deleted

## 2014-09-23 ENCOUNTER — Other Ambulatory Visit: Payer: Self-pay | Admitting: Family Medicine

## 2014-11-03 DIAGNOSIS — C439 Malignant melanoma of skin, unspecified: Secondary | ICD-10-CM | POA: Diagnosis not present

## 2014-11-03 DIAGNOSIS — C4371 Malignant melanoma of right lower limb, including hip: Secondary | ICD-10-CM | POA: Diagnosis not present

## 2014-11-03 DIAGNOSIS — C792 Secondary malignant neoplasm of skin: Secondary | ICD-10-CM | POA: Diagnosis not present

## 2014-11-21 ENCOUNTER — Other Ambulatory Visit: Payer: Self-pay | Admitting: Family Medicine

## 2014-12-02 ENCOUNTER — Encounter: Payer: Self-pay | Admitting: Family Medicine

## 2014-12-02 ENCOUNTER — Ambulatory Visit (INDEPENDENT_AMBULATORY_CARE_PROVIDER_SITE_OTHER): Payer: Medicare Other | Admitting: Family Medicine

## 2014-12-02 VITALS — BP 108/68 | HR 72 | Ht 60.0 in | Wt 169.0 lb

## 2014-12-02 DIAGNOSIS — E039 Hypothyroidism, unspecified: Secondary | ICD-10-CM | POA: Diagnosis not present

## 2014-12-02 DIAGNOSIS — Z0000 Encounter for general adult medical examination without abnormal findings: Secondary | ICD-10-CM

## 2014-12-02 DIAGNOSIS — E119 Type 2 diabetes mellitus without complications: Secondary | ICD-10-CM

## 2014-12-02 DIAGNOSIS — E7800 Pure hypercholesterolemia, unspecified: Secondary | ICD-10-CM

## 2014-12-02 DIAGNOSIS — I10 Essential (primary) hypertension: Secondary | ICD-10-CM | POA: Diagnosis not present

## 2014-12-02 DIAGNOSIS — Z01419 Encounter for gynecological examination (general) (routine) without abnormal findings: Secondary | ICD-10-CM | POA: Diagnosis not present

## 2014-12-02 DIAGNOSIS — R809 Proteinuria, unspecified: Secondary | ICD-10-CM | POA: Diagnosis not present

## 2014-12-02 DIAGNOSIS — E1149 Type 2 diabetes mellitus with other diabetic neurological complication: Secondary | ICD-10-CM | POA: Insufficient documentation

## 2014-12-02 DIAGNOSIS — E1129 Type 2 diabetes mellitus with other diabetic kidney complication: Secondary | ICD-10-CM

## 2014-12-02 DIAGNOSIS — Z85820 Personal history of malignant melanoma of skin: Secondary | ICD-10-CM

## 2014-12-02 DIAGNOSIS — E1140 Type 2 diabetes mellitus with diabetic neuropathy, unspecified: Secondary | ICD-10-CM

## 2014-12-02 LAB — COMPREHENSIVE METABOLIC PANEL
ALT: 19 U/L (ref 0–35)
AST: 22 U/L (ref 0–37)
Albumin: 4.3 g/dL (ref 3.5–5.2)
Alkaline Phosphatase: 52 U/L (ref 39–117)
BUN: 16 mg/dL (ref 6–23)
CHLORIDE: 100 meq/L (ref 96–112)
CO2: 28 mEq/L (ref 19–32)
Calcium: 9.4 mg/dL (ref 8.4–10.5)
Creat: 0.86 mg/dL (ref 0.50–1.10)
Glucose, Bld: 121 mg/dL — ABNORMAL HIGH (ref 70–99)
POTASSIUM: 4 meq/L (ref 3.5–5.3)
Sodium: 138 mEq/L (ref 135–145)
Total Bilirubin: 0.6 mg/dL (ref 0.2–1.2)
Total Protein: 7 g/dL (ref 6.0–8.3)

## 2014-12-02 LAB — LIPID PANEL
Cholesterol: 130 mg/dL (ref 0–200)
HDL: 58 mg/dL (ref >39)
LDL Cholesterol: 51 mg/dL (ref 0–99)
Total CHOL/HDL Ratio: 2.2 Ratio
Triglycerides: 104 mg/dL (ref <150)
VLDL: 21 mg/dL (ref 0–40)

## 2014-12-02 LAB — POCT GLYCOSYLATED HEMOGLOBIN (HGB A1C): Hemoglobin A1C: 6.3

## 2014-12-02 NOTE — Patient Instructions (Signed)

## 2014-12-02 NOTE — Progress Notes (Signed)
Chief Complaint  Patient presents with  . Annual Exam    fasting med check plus. No concerns.     Tonya Rogers is a 79 y.o. female who presents for annual wellness visit and follow-up on chronic medical conditions.  She has the following concerns:  Hyperlipidemia--She is compliant with her medications, taking 20mg  of simvastatin and 3000mg  of fish oil.  She is fasting for labs today. Denies any side effects to the medications, and is following a lowfat, low cholesterol diet.  Hypertension follow-up: Blood pressures elsewhere are 125-130/75-80. Denies dizziness, headaches, chest pain. Denies side effects of medications.  DM: She has not been checking her blood sugars recently, only rarely--103-130 in the mornings. No hypoglycemia, polydipsia or polyuria. Has some neuropathy--tingling in feet (not in hands), a mild burning, not painful or bothersome, unchanged/chronic. Last eye exam was in August or September. She checks her feet regularly, no sores/concerns.  Hypothyroidism:  Compliant with her medication.  Denies any changes in hair/bowels/moods.  Alopecia anteriorly is stable/unchanged. Nails are much improved since adding Biotin supplement.  After her last visit in July, another melanoma was treated on her RLE. No chemo treatment. She is seeing a new melanoma specialist over at [place] is planned.  Immunization History  Administered Date(s) Administered  . Influenza Split 08/25/2012, 08/26/2013, 09/08/2014  . Pneumococcal Conjugate-13 08/26/2014  . Pneumococcal Polysaccharide-23 12/27/2000, 07/27/2008  . Td 03/26/2005  . Tdap 03/05/2012  . Zoster 02/25/2011   Last Pap smear: 10/2013 Last mammogram: 08/2014 Last colonoscopy: 8/06 Last DEXA: 04/2014--osteopenia, stable/unchanged from 2013 Dentist: every 6 months Ophtho: 2 times/year Exercise: "not much"  Other doctors caring for patient include: Dentist: Dr. Clarene Essex (who retired, now sees female partner  there) Ophtho: Dr. Gershon Crane ENT: Dr. Benjamine Mola Dr. Dub Amis at [facility] for her melanoma, as well as another melanoma specialists at [facility] Dermatologist: Dr. Allyson Sabal GI:  Sadie Haber?  Depression screen:  See scanned questionnaire.  Notable only for some trouble staying asleep, and intermittent decreased appetite ADL screen:  See scanned questionnaire.  Notable for decreased hearing and tinnitus--she currently has hearing aids.  No falls in the last year  End of Life Discussion:  Patient has a living will and medical power of attorney  Past Medical History  Diagnosis Date  . Diabetes mellitus 2002  . Hypertension   . Pure hypercholesterolemia   . Melanoma 7/09    RLE with in-transit mets; s/p hyperthermic limb perfusion chemo and excision; re-excision of recurrence 2011  . Osteoporosis   . Diabetic neuropathy   . 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 Baker's cyst of knee     R popliteal  . Pulmonary nodules     small, seen on CT 12/09  . Cholelithiasis 12/09    asymptomatic, noticed on CT 12/09  . Alopecia     anterior hair loss  . Diverticulosis of colon   . Anxiety and depression     related to work--resolved  . Metatarsal fracture 10/2009    L 5th--Dr. Rip Harbour  . Hearing loss     hearing aids bilaterally    Past Surgical History  Procedure Laterality Date  . Tonsillectomy and adenoidectomy    . Excision of melanoma  11/23/08    wide excision of melanoma and R inguinal sentinel LN mapping and  biopsy  . Excision of recurrent melanoma  2011, 06/2014    RLE  . Cataract extraction, bilateral  01/2011  .  Tympanostomy tube placement  2013    L ear  (Dr. Benjamine Mola)    History   Social History  . Marital Status: Divorced    Spouse Name: N/A    Number of Children: 2  . Years of Education: N/A   Occupational History  . 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 Smoking status: Never Smoker   .  Smokeless tobacco: Never Used  . Alcohol Use: 0.0 oz/week    0 Not specified per week     Comment: 4 oz wine 3-4 times a week  . Drug Use: No  . Sexual Activity: Not Currently   Other Topics Concern  . Not on file   Social History Narrative   Daughter in [place] and son in [place]. 4 grandchildren.  Lives alone    Family History  Problem Relation Age of Onset  . Hypertension Mother   . Stroke Mother   . Arthritis Father   . Hemophilia Father   . Cancer Other     breast  . Diabetes Neg Hx   . Heart disease Neg Hx     Outpatient Encounter Prescriptions as of 12/02/2014  Medication Sig Note  . aspirin 81 MG tablet Take 81 mg by mouth every other day.  12/02/2014: Takes it 3x/week (due to bleeding if taking more often; bled a lot with recent dentalwork  . BIOTIN PO Take 1 tablet by mouth daily.   . Calcium Carbonate-Vitamin D (CALCIUM + D) 600-200 MG-UNIT TABS Take 1 tablet by mouth daily.    . Cholecalciferol (VITAMIN D) 1000 UNITS capsule Take 2,000 Units by mouth daily.     . Coenzyme Q10 (CO Q 10) 100 MG CAPS Take 1 capsule by mouth daily.   . fish oil-omega-3 fatty acids 1000 MG capsule Take 3 g by mouth daily.    . Insulin Pen Needle (BD PEN NEEDLE NANO U/F) 32G X 4 MM MISC by Does not apply route 2 (two) times daily.   Marland Kitchen losartan (COZAAR) 100 MG tablet TAKE 1 TABLET BY MOUTH EVERY DAY   . magnesium gluconate (MAGONATE) 500 MG tablet Take 500 mg by mouth 2 (two) times daily.     . metFORMIN (GLUCOPHAGE) 1000 MG tablet Take 1 tablet (1,000 mg  total) by mouth 2 (two)  times daily with a meal.   . Multiple Vitamins-Minerals (MULTIVITAMIN WITH MINERALS) tablet Take 1 tablet by mouth daily.     . pioglitazone (ACTOS) 15 MG tablet Take 1 tablet (15 mg total) by mouth daily.   . simvastatin (ZOCOR) 20 MG tablet Take 1 tablet by mouth  daily   . SYNTHROID 25 MCG tablet TAKE 1 TABLET BY MOUTH EVERY DAY   . vitamin C (ASCORBIC ACID) 500 MG tablet Take 500 mg by mouth daily.     Marland Kitchen  exenatide (BYETTA 10 MCG PEN) 10 MCG/0.04ML SOPN injection Inject 0.04 mLs (10 mcg total) into the skin 2 (two) times daily with a meal.    No Known Allergies  ROS: The patient denies anorexia (intermittently), fever, weight changes, headaches, vision changes, ear pain, sore throat, breast concerns, chest pain, palpitations, dizziness, syncope, dyspnea on exertion, cough, swelling, nausea, vomiting, diarrhea, abdominal pain, melena, hematochezia, indigestion/heartburn, hematuria, incontinence, dysuria,vaginal bleeding, discharge, odor or itch, genital lesions, joint pains, weakness, tremor, suspicious skin lesions, depression, anxiety, abnormal bleeding/bruising, or enlarged lymph nodes. Constipation, unchanged/chronic--controlled Hearing loss L>R ear, unchanged. Has hearing aids. +tingling/mild burning in feet  PHYSICAL EXAM:  BP 108/68 mmHg  Pulse 72  Ht 5' (1.524 m)  Wt 169 lb (76.658 kg)  BMI 33.01 kg/m2  General Appearance:   Alert, cooperative, no distress, appears stated age  Head:   Normocephalic, without obvious abnormality, atraumatic. Thinning of hair anteriorly  Eyes:   PERRL, conjunctiva/corneas clear, EOM's intact, fundi   benign  Ears:   Normal externally.  Wearing hearing aids bilaterally  Nose:  Nares normal, mucosa normal, no drainage or sinus tenderness  Throat:  Lips, mucosa, and tongue normal; teeth and gums normal  Neck:  Supple, no lymphadenopathy; thyroid: no enlargement/tenderness/nodules; no carotid  bruit or JVD  Back:  Spine nontender, no curvature, ROM normal, no CVA tenderness  Lungs:   Clear to auscultation bilaterally without wheezes, rales or ronchi; respirations unlabored  Chest Wall:   No tenderness or deformity  Heart:   Regular rate and rhythm, S1 and S2 normal, 2/6 systolic ejection murmur is present; no rub or gallop  Breast Exam:   No tenderness, masses, or nipple discharge or  inversion. No axillary lymphadenopathy  Abdomen:   Soft, non-tender, nondistended, normoactive bowel sounds,   no masses, no hepatosplenomegaly. Some bruising/ecchymosis noted on abdomen at injection sites  Genitalia:   Normal external genitalia without lesions, mild atrophic changes noted. BUS and vagina normal; no cervical motion tenderness.No abnormal vaginal discharge. Uterus and adnexa not enlarged, nontender, no masses; exam somewhat limited by body habitus. Pap not performed  Rectal:   Normal tone, no masses or tenderness; guaiac negative stool  Extremities:  No clubbing, cyanosis or edema  Pulses:  2+ and symmetric all extremities  Skin:  Skin color, texture, turgor normal, no rashes or lesions. Normal sensation to monofilament.  Healing scar at RLE below knee, slightly flaky, without infection. Discoloration of L 1st and 2nd toenails, possibly the small 5th nails also  Lymph nodes:  Cervical, supraclavicular, and axillary nodes normal  Neurologic:  CNII-XII intact, normal strength, sensation and gait; reflexes 2+ and symmetric throughout   Psych: Normal mood, affect, hygiene and grooming.   Lab Results  Component Value Date   HGBA1C 6.3 12/02/2014    ASSESSMENT/PLAN:  Medicare annual wellness visit, subsequent  Pure hypercholesterolemia - Plan: Lipid panel  Essential hypertension, benign - controlled  Microalbuminuria - recheck today - Plan: Microalbumin / creatinine urine ratio  Hypothyroidism, unspecified hypothyroidism type - euthyroid by history; normal TSH 6 months ago.  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  diabetes adequately controlled on her current regimen - Plan: Comprehensive metabolic panel  Type 2 diabetes mellitus with microalbuminuria or microproteinuria  Personal history of malignant melanoma - with recent recurrence  (2nd). f/u with specialists at [facility]  Type 2 diabetes mellitus with diabetic neuropathy  Diabetes mellitus without complication - Plan: HgB A1c   Patient could not recall which meds she got from which pharmacy, so after all refills were entered, unable to send.  She will let us know when she needs medication and from which pharmacy.    Discussed monthly self breast exams and yearly mammograms; at least 30 minutes of aerobic activity at least 5 days/week and weight-bearing exercise 2x/week; proper sunscreen use reviewed; healthy diet, including goals of calcium and vitamin D intake and alcohol recommendations (less than or equal to 1 drink/day) reviewed; regular seatbelt use; changing batteries in smoke detectors.  Immunization recommendations discussed, UTD.  Colonoscopy recommendations reviewed, due 06/2015   Medicare Attestation I have personally reviewed: The patient's  medical and social history Their use of alcohol, tobacco or illicit drugs Their current medications and supplements The patient's functional ability including ADLs,fall risks, home safety risks, cognitive, and hearing and visual impairment Diet and physical activities Evidence for depression or mood disorders  The patient's weight, height, BMI, and visual acuity have been recorded in the chart.  I have made referrals, counseling, and provided education to the patient based on review of the above and I have provided the patient with a written personalized care plan for preventive services.     Reata Petrov A, MD   12/02/2014

## 2014-12-03 LAB — MICROALBUMIN / CREATININE URINE RATIO
CREATININE, URINE: 151.6 mg/dL
MICROALB/CREAT RATIO: 74.5 mg/g — AB (ref 0.0–30.0)
Microalb, Ur: 11.3 mg/dL — ABNORMAL HIGH (ref <2.0)

## 2014-12-06 ENCOUNTER — Other Ambulatory Visit: Payer: Self-pay | Admitting: *Deleted

## 2014-12-06 DIAGNOSIS — E119 Type 2 diabetes mellitus without complications: Secondary | ICD-10-CM

## 2014-12-06 MED ORDER — PIOGLITAZONE HCL 15 MG PO TABS: 15.0000 mg | ORAL_TABLET | Freq: Every day | ORAL | Status: DC

## 2014-12-19 ENCOUNTER — Other Ambulatory Visit: Payer: Self-pay | Admitting: Family Medicine

## 2014-12-20 ENCOUNTER — Other Ambulatory Visit: Payer: Self-pay | Admitting: *Deleted

## 2014-12-20 MED ORDER — LOSARTAN POTASSIUM 100 MG PO TABS: 100.0000 mg | ORAL_TABLET | Freq: Every day | ORAL | Status: DC

## 2014-12-27 DIAGNOSIS — D225 Melanocytic nevi of trunk: Secondary | ICD-10-CM | POA: Diagnosis not present

## 2014-12-27 DIAGNOSIS — Z85820 Personal history of malignant melanoma of skin: Secondary | ICD-10-CM | POA: Diagnosis not present

## 2014-12-27 DIAGNOSIS — L814 Other melanin hyperpigmentation: Secondary | ICD-10-CM | POA: Diagnosis not present

## 2014-12-27 DIAGNOSIS — Z08 Encounter for follow-up examination after completed treatment for malignant neoplasm: Secondary | ICD-10-CM | POA: Diagnosis not present

## 2015-01-06 ENCOUNTER — Other Ambulatory Visit: Payer: Self-pay | Admitting: *Deleted

## 2015-01-06 DIAGNOSIS — E119 Type 2 diabetes mellitus without complications: Secondary | ICD-10-CM

## 2015-01-06 MED ORDER — METFORMIN HCL 1000 MG PO TABS: ORAL_TABLET | ORAL | Status: DC

## 2015-02-01 ENCOUNTER — Other Ambulatory Visit: Payer: Self-pay | Admitting: Family Medicine

## 2015-02-02 DIAGNOSIS — H9192 Unspecified hearing loss, left ear: Secondary | ICD-10-CM | POA: Diagnosis not present

## 2015-02-02 DIAGNOSIS — C4371 Malignant melanoma of right lower limb, including hip: Secondary | ICD-10-CM | POA: Diagnosis not present

## 2015-02-02 DIAGNOSIS — R918 Other nonspecific abnormal finding of lung field: Secondary | ICD-10-CM | POA: Diagnosis not present

## 2015-02-02 DIAGNOSIS — E119 Type 2 diabetes mellitus without complications: Secondary | ICD-10-CM | POA: Diagnosis not present

## 2015-02-02 DIAGNOSIS — I10 Essential (primary) hypertension: Secondary | ICD-10-CM | POA: Diagnosis not present

## 2015-02-02 DIAGNOSIS — K5790 Diverticulosis of intestine, part unspecified, without perforation or abscess without bleeding: Secondary | ICD-10-CM | POA: Diagnosis not present

## 2015-02-02 DIAGNOSIS — Z9221 Personal history of antineoplastic chemotherapy: Secondary | ICD-10-CM | POA: Diagnosis not present

## 2015-02-02 DIAGNOSIS — E780 Pure hypercholesterolemia: Secondary | ICD-10-CM | POA: Diagnosis not present

## 2015-02-02 DIAGNOSIS — Z888 Allergy status to other drugs, medicaments and biological substances status: Secondary | ICD-10-CM | POA: Diagnosis not present

## 2015-02-02 DIAGNOSIS — Z85820 Personal history of malignant melanoma of skin: Secondary | ICD-10-CM | POA: Diagnosis not present

## 2015-02-02 DIAGNOSIS — E079 Disorder of thyroid, unspecified: Secondary | ICD-10-CM | POA: Diagnosis not present

## 2015-02-02 DIAGNOSIS — M810 Age-related osteoporosis without current pathological fracture: Secondary | ICD-10-CM | POA: Diagnosis not present

## 2015-02-02 DIAGNOSIS — I700 Atherosclerosis of aorta: Secondary | ICD-10-CM | POA: Diagnosis not present

## 2015-02-02 DIAGNOSIS — D259 Leiomyoma of uterus, unspecified: Secondary | ICD-10-CM | POA: Diagnosis not present

## 2015-02-02 DIAGNOSIS — K7689 Other specified diseases of liver: Secondary | ICD-10-CM | POA: Diagnosis not present

## 2015-02-03 ENCOUNTER — Other Ambulatory Visit: Payer: Self-pay | Admitting: *Deleted

## 2015-02-03 MED ORDER — SIMVASTATIN 20 MG PO TABS: ORAL_TABLET | ORAL | Status: DC

## 2015-02-19 ENCOUNTER — Other Ambulatory Visit: Payer: Self-pay | Admitting: Family Medicine

## 2015-03-30 DIAGNOSIS — H903 Sensorineural hearing loss, bilateral: Secondary | ICD-10-CM | POA: Diagnosis not present

## 2015-05-04 DIAGNOSIS — C792 Secondary malignant neoplasm of skin: Secondary | ICD-10-CM | POA: Diagnosis not present

## 2015-05-04 DIAGNOSIS — C4371 Malignant melanoma of right lower limb, including hip: Secondary | ICD-10-CM | POA: Diagnosis not present

## 2015-05-04 DIAGNOSIS — Z08 Encounter for follow-up examination after completed treatment for malignant neoplasm: Secondary | ICD-10-CM | POA: Diagnosis not present

## 2015-05-04 DIAGNOSIS — Z85820 Personal history of malignant melanoma of skin: Secondary | ICD-10-CM | POA: Diagnosis not present

## 2015-05-04 DIAGNOSIS — Z9889 Other specified postprocedural states: Secondary | ICD-10-CM | POA: Diagnosis not present

## 2015-05-31 ENCOUNTER — Telehealth: Payer: Self-pay | Admitting: Family Medicine

## 2015-05-31 DIAGNOSIS — E119 Type 2 diabetes mellitus without complications: Secondary | ICD-10-CM

## 2015-05-31 MED ORDER — PIOGLITAZONE HCL 15 MG PO TABS: 15.0000 mg | ORAL_TABLET | Freq: Every day | ORAL | Status: DC

## 2015-05-31 NOTE — Telephone Encounter (Signed)
Has appt 7/20. Refill done

## 2015-05-31 NOTE — Telephone Encounter (Signed)
Pt called for refill of Pioglitazone. Please send to mail order pharmacy for 90 days. [facility] to [facility]. Pt can be reached at [phone].

## 2015-06-13 ENCOUNTER — Telehealth: Payer: Self-pay | Admitting: *Deleted

## 2015-06-13 DIAGNOSIS — E039 Hypothyroidism, unspecified: Secondary | ICD-10-CM

## 2015-06-13 DIAGNOSIS — E7800 Pure hypercholesterolemia, unspecified: Secondary | ICD-10-CM

## 2015-06-13 DIAGNOSIS — I10 Essential (primary) hypertension: Secondary | ICD-10-CM

## 2015-06-13 DIAGNOSIS — R809 Proteinuria, unspecified: Secondary | ICD-10-CM

## 2015-06-13 DIAGNOSIS — E1129 Type 2 diabetes mellitus with other diabetic kidney complication: Secondary | ICD-10-CM

## 2015-06-13 NOTE — Telephone Encounter (Signed)
Patient scheduled for 07/07/15 for med check, would like to know if she can come in Wed 07/06/15 for labs for med check.

## 2015-06-13 NOTE — Telephone Encounter (Signed)
Orders entered

## 2015-06-15 ENCOUNTER — Encounter: Payer: Medicare Other | Admitting: Family Medicine

## 2015-06-22 ENCOUNTER — Other Ambulatory Visit: Payer: Self-pay | Admitting: *Deleted

## 2015-06-22 MED ORDER — SYNTHROID 25 MCG PO TABS: 25.0000 ug | ORAL_TABLET | Freq: Every day | ORAL | Status: DC

## 2015-07-01 ENCOUNTER — Other Ambulatory Visit: Payer: Self-pay | Admitting: Family Medicine

## 2015-07-04 ENCOUNTER — Other Ambulatory Visit: Payer: Self-pay | Admitting: *Deleted

## 2015-07-04 MED ORDER — INSULIN PEN NEEDLE 32G X 4 MM MISC: Status: DC

## 2015-07-04 MED ORDER — EXENATIDE 10 MCG/0.04ML ~~LOC~~ SOPN: PEN_INJECTOR | SUBCUTANEOUS | Status: DC

## 2015-07-06 ENCOUNTER — Other Ambulatory Visit: Payer: BLUE CROSS/BLUE SHIELD

## 2015-07-06 DIAGNOSIS — R809 Proteinuria, unspecified: Secondary | ICD-10-CM

## 2015-07-06 DIAGNOSIS — E1129 Type 2 diabetes mellitus with other diabetic kidney complication: Secondary | ICD-10-CM

## 2015-07-06 DIAGNOSIS — I10 Essential (primary) hypertension: Secondary | ICD-10-CM

## 2015-07-06 DIAGNOSIS — E7800 Pure hypercholesterolemia, unspecified: Secondary | ICD-10-CM

## 2015-07-06 DIAGNOSIS — E039 Hypothyroidism, unspecified: Secondary | ICD-10-CM

## 2015-07-06 DIAGNOSIS — E119 Type 2 diabetes mellitus without complications: Secondary | ICD-10-CM | POA: Diagnosis not present

## 2015-07-06 LAB — COMPREHENSIVE METABOLIC PANEL
ALBUMIN: 4.2 g/dL (ref 3.6–5.1)
ALT: 17 U/L (ref 6–29)
AST: 21 U/L (ref 10–35)
Alkaline Phosphatase: 41 U/L (ref 33–130)
BUN: 13 mg/dL (ref 7–25)
CALCIUM: 10.1 mg/dL (ref 8.6–10.4)
CHLORIDE: 100 mmol/L (ref 98–110)
CO2: 28 mmol/L (ref 20–31)
CREATININE: 0.86 mg/dL (ref 0.60–0.93)
Glucose, Bld: 121 mg/dL — ABNORMAL HIGH (ref 65–99)
POTASSIUM: 4.1 mmol/L (ref 3.5–5.3)
SODIUM: 139 mmol/L (ref 135–146)
TOTAL PROTEIN: 6.7 g/dL (ref 6.1–8.1)
Total Bilirubin: 0.6 mg/dL (ref 0.2–1.2)

## 2015-07-06 LAB — LIPID PANEL
CHOL/HDL RATIO: 2.4 ratio (ref <=5.0)
Cholesterol: 127 mg/dL (ref 125–200)
HDL: 54 mg/dL (ref >=46)
LDL CALC: 50 mg/dL (ref <130)
Triglycerides: 116 mg/dL (ref <150)
VLDL: 23 mg/dL (ref <30)

## 2015-07-06 LAB — HEMOGLOBIN A1C
Hgb A1c MFr Bld: 6.6 % — ABNORMAL HIGH (ref <5.7)
Mean Plasma Glucose: 143 mg/dL — ABNORMAL HIGH (ref <117)

## 2015-07-06 LAB — TSH: TSH: 6.823 u[IU]/mL — ABNORMAL HIGH (ref 0.350–4.500)

## 2015-07-07 ENCOUNTER — Encounter: Payer: Self-pay | Admitting: Family Medicine

## 2015-07-07 ENCOUNTER — Ambulatory Visit (INDEPENDENT_AMBULATORY_CARE_PROVIDER_SITE_OTHER): Payer: Medicare Other | Admitting: Family Medicine

## 2015-07-07 VITALS — BP 120/72 | HR 68 | Ht 60.0 in | Wt 172.2 lb

## 2015-07-07 DIAGNOSIS — E039 Hypothyroidism, unspecified: Secondary | ICD-10-CM | POA: Diagnosis not present

## 2015-07-07 DIAGNOSIS — I10 Essential (primary) hypertension: Secondary | ICD-10-CM | POA: Diagnosis not present

## 2015-07-07 DIAGNOSIS — R809 Proteinuria, unspecified: Secondary | ICD-10-CM | POA: Diagnosis not present

## 2015-07-07 DIAGNOSIS — E1140 Type 2 diabetes mellitus with diabetic neuropathy, unspecified: Secondary | ICD-10-CM | POA: Diagnosis not present

## 2015-07-07 DIAGNOSIS — E7800 Pure hypercholesterolemia, unspecified: Secondary | ICD-10-CM

## 2015-07-07 DIAGNOSIS — Z5181 Encounter for therapeutic drug level monitoring: Secondary | ICD-10-CM

## 2015-07-07 DIAGNOSIS — E1129 Type 2 diabetes mellitus with other diabetic kidney complication: Secondary | ICD-10-CM

## 2015-07-07 DIAGNOSIS — E119 Type 2 diabetes mellitus without complications: Secondary | ICD-10-CM

## 2015-07-07 DIAGNOSIS — E1149 Type 2 diabetes mellitus with other diabetic neurological complication: Secondary | ICD-10-CM

## 2015-07-07 LAB — MICROALBUMIN / CREATININE URINE RATIO
CREATININE, URINE: 154.8 mg/dL
MICROALB UR: 6.2 mg/dL — AB (ref <2.0)
Microalb Creat Ratio: 40.1 mg/g — ABNORMAL HIGH (ref 0.0–30.0)

## 2015-07-07 MED ORDER — LOSARTAN POTASSIUM 100 MG PO TABS: 100.0000 mg | ORAL_TABLET | Freq: Every day | ORAL | Status: DC

## 2015-07-07 MED ORDER — METFORMIN HCL 1000 MG PO TABS: ORAL_TABLET | ORAL | Status: DC

## 2015-07-07 NOTE — Progress Notes (Signed)
Chief Complaint  Patient presents with  . Diabetes    nonfasting med check. Would like to discuss price of Byetta. Paying over $200 per month. Will call [facility] to find out what 3 month supply would cost and also willing to call insurance and find out pricing of others meds in this class.    Hyperlipidemia--She is compliant with her medications, taking 20mg  of simvastatin and 3000mg  of fish oil. Denies any side effects to the medications, and is following a lowfat, low cholesterol diet.  Hypertension follow-up: Blood pressures elsewhere are 125-130/69-80. Denies dizziness, headaches, chest pain. Denies side effects of medications.  DM: She has not been checking her blood sugars recently, only rarely; recalls it being 115, 129  a few months ago. No hypoglycemia, polydipsia or polyuria. Has some neuropathy--tingling in feet (not in hands), a mild burning, not painful or bothersome, unchanged/chronic. Last eye exam was in September 2015 and is awaiting a call to schedule this year's visit. She checks her feet regularly, no sores/concerns. Her concern is with the cost of the Byetta, looking for a less expensive alternative.  Hypothyroidism:  She denies any missed pills, still taking the branded Synthroid.  She has always been crushing this, mixing it with water, as she had trouble swallowing the pill when she tried swallowing it whole.  She has been taking it this way for many years.  Denies any change in energy, hair/skin/bowels/moods, temperature intolerance.  She has some issues with nail splitting over the years. Biotin has helped, but periodically has a problem (occurs just once a year usually, in the late Spring/early summer).  This has been addressed with Dr. Allyson Sabal as well.  Melanoma--doing well per doctors at [facility] (last melanoma was removed almost a year ago).  PMH, PSH. SH reviewed.  Outpatient Encounter Prescriptions as of 07/07/2015  Medication Sig Note  . aspirin 81 MG tablet Take  81 mg by mouth every other day.  12/02/2014: Takes it 3x/week (due to bleeding if taking more often; bled a lot with recent dentalwork  . BIOTIN PO Take 1 tablet by mouth daily.   . Calcium Carbonate-Vitamin D (CALCIUM + D) 600-200 MG-UNIT TABS Take 1 tablet by mouth daily.    . Cholecalciferol (VITAMIN D) 1000 UNITS capsule Take 2,000 Units by mouth daily.     . Coenzyme Q10 (CO Q 10) 100 MG CAPS Take 1 capsule by mouth daily.   Marland Kitchen exenatide (BYETTA 10 MCG PEN) 10 MCG/0.04ML SOPN injection ADMINISTER 10 MCG UNDER THE SKIN SKIN TWICE DAILY WITH A MEAL   . fish oil-omega-3 fatty acids 1000 MG capsule Take 3 g by mouth daily.    . Insulin Pen Needle (BD PEN NEEDLE NANO U/F) 32G X 4 MM MISC USE AS DIRECTED WITH BYETTA(TWICE DAILY)   . losartan (COZAAR) 100 MG tablet Take 1 tablet (100 mg total) by mouth daily.   . magnesium gluconate (MAGONATE) 500 MG tablet Take 500 mg by mouth 2 (two) times daily.     . metFORMIN (GLUCOPHAGE) 1000 MG tablet Take 1 tablet (1,000 mg  total) by mouth 2 (two)  times daily with a meal.   . Multiple Vitamins-Minerals (MULTIVITAMIN WITH MINERALS) tablet Take 1 tablet by mouth daily.     . pioglitazone (ACTOS) 15 MG tablet Take 1 tablet (15 mg total) by mouth daily.   . simvastatin (ZOCOR) 20 MG tablet Take 1 tablet by mouth  daily   . SYNTHROID 25 MCG tablet Take 1 tablet (25 mcg  total) by mouth daily.   . vitamin C (ASCORBIC ACID) 500 MG tablet Take 500 mg by mouth daily.      No facility-administered encounter medications on file as of 07/07/2015.   No Known Allergies  ROS:  Some neck pain, unable to extend back fully.  She denies headaches, dizziness, syncope, URI symptoms, cough, shortness of breath, chest pain, palpitations, nausea, vomiting, bowel changes, urinary complaints, other joint pains, bleeding, bruising, rash.  Mild neuropathy in her feet.  Moods are good.  See HPI.  PHYSICAL EXAM: BP 120/72 mmHg  Pulse 68  Ht 5' (1.524 m)  Wt 172 lb 3.2 oz (78.109 kg)   BMI 33.63 kg/m2  Well developed, pleasant female in no distress  Neck: no lymphadenopathy, thyromegaly or mass, no carotid bruit Heart: regular rate and rhythm with 2/6 SEM at RUSB  Lungs: clear bilaterally  Abdomen: soft, nontender, no mass  Extremities: no edema, 2+ pulses. WHSS on RLE.  Skin: no suspicious lesions Neuro: alert and oriented. Normal gait; normal strength, sensation Psych: normal mood, affect, hygiene and grooming   Lab Results  Component Value Date   HGBA1C 6.6* 07/06/2015   Lab Results  Component Value Date   CHOL 127 07/06/2015   HDL 54 07/06/2015   LDLCALC 50 07/06/2015   TRIG 116 07/06/2015   CHOLHDL 2.4 07/06/2015   Lab Results  Component Value Date   TSH 6.823* 07/06/2015   Urine microalbumin/Cr ratio 40.1 (down from 74.5).    Chemistry      Component Value Date/Time   NA 139 07/06/2015 0001   K 4.1 07/06/2015 0001   CL 100 07/06/2015 0001   CO2 28 07/06/2015 0001   BUN 13 07/06/2015 0001   CREATININE 0.86 07/06/2015 0001      Component Value Date/Time   CALCIUM 10.1 07/06/2015 0001   CALCIUM 9.7 04/23/2012 0915   ALKPHOS 41 07/06/2015 0001   AST 21 07/06/2015 0001   ALT 17 07/06/2015 0001   BILITOT 0.6 07/06/2015 0001     Fasting glucose 121  ASSESSMENT/PLAN:  Pure hypercholesterolemia - at goal  Essential hypertension, benign - controlled - Plan: losartan (COZAAR) 100 MG tablet  Microalbuminuria - improved, but still abnormal  Hypothyroidism, unspecified hypothyroidism type - TSH elevated, without symptoms. 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  Discussed increasing dose and rechecking in 6 weeks versus just  rechecking TSH in 3 months (sooner if symptoms develop).  Prefers to recheck in 3 months.    F/u 6 months for medcheck+/AWV--pt wants labs prior, and orders were entered. Lab visit in 3 months for TSH only. May also need A1c if DM meds are changed (and possibly OV, if not at goal).  Advise patient to ensure that at her 3 month visit only TSH is done, that she doesn't need to be fasting, and to verify that her physical labs aren't getting done then.  Check with your insurance to see if there are preferred agents within the GLP-1 category (Byetta, Victoza-also a daily shot, Trulicity (weekly), bydureon (this is a weekly version of the Byetta), and others).  Also see if they have a preferred, affordable option for DPP-4 inhibitors (ie Onglyza, Januvia, etc). Oseni is a  combination of pioglitazone and a DPP-4 inhibitor--doubt that is going to be inexpensive, but covers 2 drugs.  Check the prices of the medications (Byetta) at [employer], [place], as often you will get a better price than at [facility].

## 2015-07-07 NOTE — Patient Instructions (Addendum)
Check with your insurance to see if there are preferred agents within the GLP-1 category (Byetta, Victoza-also a daily shot, Trulicity (weekly), bydureon (this is a weekly version of the Byetta), and others).  Also see if they have a preferred, affordable option for DPP-4 inhibitors (ie Onglyza, Januvia, etc). Oseni is a combination of pioglitazone and a DPP-4 inhibitor--doubt that is going to be inexpensive, but covers 2 drugs.  Check the prices of the medications (Byetta) at [employer], [place], as often you will get a better price than at [facility].    Your thyroid appears to be a little underactive.  Since you aren't having symptoms, we don't HAVE to change the dose at this point, but should monitor it more closely.  Let us know if you have change in energy, weight, hair/skin/bowels, moods, as these can all be related to the thyroid function.  Let's plan on rechecking in 3 months.  When you come in 3 months for bloodwork, please make sure that they are only doing the thyroid test (and possibly A1c if your diabetes medication changed).  Do NOT have them release all the labs that are put in for prior to your physical (ie lipids, etc).  You do NOT need to be fasting in 3 months, you do need to fast before the physical labs.

## 2015-07-11 ENCOUNTER — Other Ambulatory Visit: Payer: Self-pay | Admitting: *Deleted

## 2015-08-08 ENCOUNTER — Other Ambulatory Visit: Payer: Self-pay | Admitting: *Deleted

## 2015-08-08 MED ORDER — EXENATIDE 10 MCG/0.04ML ~~LOC~~ SOPN: PEN_INJECTOR | SUBCUTANEOUS | Status: DC

## 2015-08-10 DIAGNOSIS — Z85820 Personal history of malignant melanoma of skin: Secondary | ICD-10-CM | POA: Diagnosis not present

## 2015-08-10 DIAGNOSIS — Z888 Allergy status to other drugs, medicaments and biological substances status: Secondary | ICD-10-CM | POA: Diagnosis not present

## 2015-08-10 DIAGNOSIS — K5790 Diverticulosis of intestine, part unspecified, without perforation or abscess without bleeding: Secondary | ICD-10-CM | POA: Diagnosis not present

## 2015-08-10 DIAGNOSIS — Z08 Encounter for follow-up examination after completed treatment for malignant neoplasm: Secondary | ICD-10-CM | POA: Diagnosis not present

## 2015-08-10 DIAGNOSIS — Z9889 Other specified postprocedural states: Secondary | ICD-10-CM | POA: Diagnosis not present

## 2015-08-10 DIAGNOSIS — C4359 Malignant melanoma of other part of trunk: Secondary | ICD-10-CM | POA: Diagnosis not present

## 2015-08-10 DIAGNOSIS — R918 Other nonspecific abnormal finding of lung field: Secondary | ICD-10-CM | POA: Diagnosis not present

## 2015-08-10 DIAGNOSIS — E119 Type 2 diabetes mellitus without complications: Secondary | ICD-10-CM | POA: Diagnosis not present

## 2015-08-11 ENCOUNTER — Telehealth: Payer: Self-pay | Admitting: Family Medicine

## 2015-08-11 ENCOUNTER — Other Ambulatory Visit: Payer: Self-pay | Admitting: Family Medicine

## 2015-08-11 NOTE — Telephone Encounter (Signed)
Already sent.

## 2015-08-11 NOTE — Telephone Encounter (Signed)
Pt called and wanted to let you know that there was a fax  refill request from [facility] for her [place]. She just wanted to give you a heads up.

## 2015-08-17 DIAGNOSIS — Z23 Encounter for immunization: Secondary | ICD-10-CM | POA: Diagnosis not present

## 2015-09-17 ENCOUNTER — Other Ambulatory Visit: Payer: Self-pay | Admitting: Family Medicine

## 2015-10-12 ENCOUNTER — Other Ambulatory Visit: Payer: Medicare Other

## 2015-10-12 DIAGNOSIS — E039 Hypothyroidism, unspecified: Secondary | ICD-10-CM

## 2015-10-13 LAB — TSH: TSH: 4.375 u[IU]/mL (ref 0.350–4.500)

## 2015-11-02 DIAGNOSIS — Z08 Encounter for follow-up examination after completed treatment for malignant neoplasm: Secondary | ICD-10-CM | POA: Diagnosis not present

## 2015-11-02 DIAGNOSIS — C4371 Malignant melanoma of right lower limb, including hip: Secondary | ICD-10-CM | POA: Diagnosis not present

## 2015-11-02 DIAGNOSIS — C792 Secondary malignant neoplasm of skin: Secondary | ICD-10-CM | POA: Diagnosis not present

## 2015-11-02 DIAGNOSIS — Z85820 Personal history of malignant melanoma of skin: Secondary | ICD-10-CM | POA: Diagnosis not present

## 2015-11-02 DIAGNOSIS — Z9889 Other specified postprocedural states: Secondary | ICD-10-CM | POA: Diagnosis not present

## 2015-11-03 ENCOUNTER — Other Ambulatory Visit: Payer: Self-pay | Admitting: Family Medicine

## 2015-11-07 ENCOUNTER — Other Ambulatory Visit: Payer: Self-pay | Admitting: Family Medicine

## 2015-11-07 ENCOUNTER — Other Ambulatory Visit: Payer: Self-pay | Admitting: *Deleted

## 2015-11-07 ENCOUNTER — Telehealth: Payer: Self-pay | Admitting: *Deleted

## 2015-11-07 DIAGNOSIS — E119 Type 2 diabetes mellitus without complications: Secondary | ICD-10-CM

## 2015-11-07 MED ORDER — SIMVASTATIN 20 MG PO TABS: ORAL_TABLET | ORAL | Status: DC

## 2015-11-07 MED ORDER — PIOGLITAZONE HCL 15 MG PO TABS: 15.0000 mg | ORAL_TABLET | Freq: Every day | ORAL | Status: DC

## 2015-11-07 NOTE — Telephone Encounter (Signed)
Ok to change to Synthroid 50 mcg (DAW). She should have TSH in 6 weeks with OV after (vs drawn on the same day as visit).

## 2015-11-07 NOTE — Telephone Encounter (Signed)
Patient called and she is now having thyroid symptoms, think her dose of synthroid needs to be increased. (last TSH was 09/2015 and was not having symptoms). Her skin is very dry, hair is falling out more than usual as well as her eyebrows. She has also noticed and increase in fatigue. Do you want to change her dosage?

## 2015-11-07 NOTE — Telephone Encounter (Signed)
Left message for patient to please call me back and schedule 6 week lab visit followed by OV a day or two later. She will take 2 of the 54mcg tabs for now as she has a lot and will call when she needs rx sent for 71mcg.

## 2015-11-30 ENCOUNTER — Other Ambulatory Visit: Payer: Self-pay | Admitting: *Deleted

## 2015-11-30 MED ORDER — SYNTHROID 50 MCG PO TABS: 50.0000 ug | ORAL_TABLET | Freq: Every day | ORAL | Status: DC

## 2015-12-17 ENCOUNTER — Other Ambulatory Visit: Payer: Self-pay | Admitting: Family Medicine

## 2015-12-26 ENCOUNTER — Other Ambulatory Visit: Payer: Medicare Other

## 2015-12-26 ENCOUNTER — Other Ambulatory Visit: Payer: Self-pay | Admitting: Family Medicine

## 2015-12-26 DIAGNOSIS — E1149 Type 2 diabetes mellitus with other diabetic neurological complication: Secondary | ICD-10-CM | POA: Diagnosis not present

## 2015-12-26 DIAGNOSIS — Z5181 Encounter for therapeutic drug level monitoring: Secondary | ICD-10-CM

## 2015-12-26 DIAGNOSIS — E0590 Thyrotoxicosis, unspecified without thyrotoxic crisis or storm: Secondary | ICD-10-CM | POA: Diagnosis not present

## 2015-12-26 LAB — CBC WITH DIFFERENTIAL/PLATELET
BASOS ABS: 0.1 10*3/uL (ref 0.0–0.1)
BASOS PCT: 1 % (ref 0–1)
Eosinophils Absolute: 0.7 10*3/uL (ref 0.0–0.7)
Eosinophils Relative: 9 % — ABNORMAL HIGH (ref 0–5)
HCT: 41.9 % (ref 36.0–46.0)
HEMOGLOBIN: 13.9 g/dL (ref 12.0–15.0)
Lymphocytes Relative: 33 % (ref 12–46)
Lymphs Abs: 2.5 10*3/uL (ref 0.7–4.0)
MCH: 29.5 pg (ref 26.0–34.0)
MCHC: 33.2 g/dL (ref 30.0–36.0)
MCV: 89 fL (ref 78.0–100.0)
MONOS PCT: 9 % (ref 3–12)
MPV: 9.8 fL (ref 8.6–12.4)
Monocytes Absolute: 0.7 10*3/uL (ref 0.1–1.0)
NEUTROS ABS: 3.6 10*3/uL (ref 1.7–7.7)
NEUTROS PCT: 48 % (ref 43–77)
Platelets: 283 10*3/uL (ref 150–400)
RBC: 4.71 MIL/uL (ref 3.87–5.11)
RDW: 14.1 % (ref 11.5–15.5)
WBC: 7.5 10*3/uL (ref 4.0–10.5)

## 2015-12-26 LAB — COMPREHENSIVE METABOLIC PANEL
ALBUMIN: 4.1 g/dL (ref 3.6–5.1)
ALT: 17 U/L (ref 6–29)
AST: 20 U/L (ref 10–35)
Alkaline Phosphatase: 48 U/L (ref 33–130)
BILIRUBIN TOTAL: 0.5 mg/dL (ref 0.2–1.2)
BUN: 14 mg/dL (ref 7–25)
CHLORIDE: 99 mmol/L (ref 98–110)
CO2: 31 mmol/L (ref 20–31)
CREATININE: 0.87 mg/dL (ref 0.60–0.93)
Calcium: 9.9 mg/dL (ref 8.6–10.4)
Glucose, Bld: 120 mg/dL — ABNORMAL HIGH (ref 65–99)
Potassium: 4.3 mmol/L (ref 3.5–5.3)
SODIUM: 140 mmol/L (ref 135–146)
Total Protein: 6.5 g/dL (ref 6.1–8.1)

## 2015-12-26 LAB — HEMOGLOBIN A1C
HEMOGLOBIN A1C: 6.7 % — AB (ref <5.7)
MEAN PLASMA GLUCOSE: 146 mg/dL — AB (ref <117)

## 2015-12-27 LAB — MICROALBUMIN / CREATININE URINE RATIO
Creatinine, Urine: 79 mg/dL (ref 20–320)
Microalb Creat Ratio: 87 mcg/mg creat — ABNORMAL HIGH (ref <30)
Microalb, Ur: 6.9 mg/dL

## 2015-12-28 ENCOUNTER — Encounter: Payer: Medicare Other | Admitting: Family Medicine

## 2015-12-28 LAB — TSH: TSH: 3.244 u[IU]/mL (ref 0.350–4.500)

## 2015-12-29 ENCOUNTER — Other Ambulatory Visit: Payer: Self-pay | Admitting: Family Medicine

## 2015-12-29 MED ORDER — SYNTHROID 50 MCG PO TABS: 50.0000 ug | ORAL_TABLET | Freq: Every day | ORAL | Status: DC

## 2015-12-29 MED ORDER — EXENATIDE ER 2 MG ~~LOC~~ PEN: 2.0000 mg | PEN_INJECTOR | SUBCUTANEOUS | Status: DC

## 2016-01-02 DIAGNOSIS — L812 Freckles: Secondary | ICD-10-CM | POA: Diagnosis not present

## 2016-01-02 DIAGNOSIS — Z85820 Personal history of malignant melanoma of skin: Secondary | ICD-10-CM | POA: Diagnosis not present

## 2016-01-02 DIAGNOSIS — L821 Other seborrheic keratosis: Secondary | ICD-10-CM | POA: Diagnosis not present

## 2016-01-02 DIAGNOSIS — D1801 Hemangioma of skin and subcutaneous tissue: Secondary | ICD-10-CM | POA: Diagnosis not present

## 2016-01-02 DIAGNOSIS — L570 Actinic keratosis: Secondary | ICD-10-CM | POA: Diagnosis not present

## 2016-01-17 ENCOUNTER — Other Ambulatory Visit: Payer: BLUE CROSS/BLUE SHIELD

## 2016-01-18 ENCOUNTER — Ambulatory Visit (INDEPENDENT_AMBULATORY_CARE_PROVIDER_SITE_OTHER): Payer: Medicare Other | Admitting: Family Medicine

## 2016-01-18 ENCOUNTER — Encounter: Payer: Self-pay | Admitting: Family Medicine

## 2016-01-18 VITALS — BP 134/64 | HR 80 | Ht 60.0 in | Wt 171.4 lb

## 2016-01-18 DIAGNOSIS — E1129 Type 2 diabetes mellitus with other diabetic kidney complication: Secondary | ICD-10-CM

## 2016-01-18 DIAGNOSIS — I10 Essential (primary) hypertension: Secondary | ICD-10-CM | POA: Diagnosis not present

## 2016-01-18 DIAGNOSIS — Z0000 Encounter for general adult medical examination without abnormal findings: Secondary | ICD-10-CM

## 2016-01-18 DIAGNOSIS — E1149 Type 2 diabetes mellitus with other diabetic neurological complication: Secondary | ICD-10-CM

## 2016-01-18 DIAGNOSIS — M8580 Other specified disorders of bone density and structure, unspecified site: Secondary | ICD-10-CM

## 2016-01-18 DIAGNOSIS — R809 Proteinuria, unspecified: Secondary | ICD-10-CM | POA: Diagnosis not present

## 2016-01-18 DIAGNOSIS — E119 Type 2 diabetes mellitus without complications: Secondary | ICD-10-CM

## 2016-01-18 DIAGNOSIS — E039 Hypothyroidism, unspecified: Secondary | ICD-10-CM | POA: Diagnosis not present

## 2016-01-18 DIAGNOSIS — E7800 Pure hypercholesterolemia, unspecified: Secondary | ICD-10-CM

## 2016-01-18 DIAGNOSIS — Z01419 Encounter for gynecological examination (general) (routine) without abnormal findings: Secondary | ICD-10-CM

## 2016-01-18 MED ORDER — SYNTHROID 50 MCG PO TABS: 50.0000 ug | ORAL_TABLET | Freq: Every day | ORAL | Status: DC

## 2016-01-18 NOTE — Progress Notes (Signed)
Chief Complaint  Patient presents with  . Medicare Wellness    nonfasting annual wellness/med check plus with pap. No concerns. Has not started Bydureon, has 3 days of Byetta left.     Tonya Rogers is a 80 y.o. female who presents for annual wellness visit and follow-up on chronic medical conditions.  She has the following concerns:  DM: She has not been checking her blood sugars recently--had issue with battery in her glucometer, needs to recalibrate. No hypoglycemia, polydipsia or polyuria. Has some neuropathy--tingling in feet (not in hands), a mild burning, not painful or bothersome, unchanged/chronic. Last eye exam was last year, due soon.  She checks her feet regularly, no sores/concerns. Byetta has gotten too expensive.  Bydureon is affordable ($40/month) and she plans to start soon. She has a sample and rx.  Hyperlipidemia--She is compliant with her medications, taking 44m of simvastatin and 30059mof fish oil. Denies any side effects to the medications, and is following a lowfat, low cholesterol diet.  Hypertension follow-up: Blood pressures elsewhere are 118-130/78-80. Denies dizziness, headaches, chest pain. Denies side effects of medications.  Hypothyroidism: She denies any missed pills, still taking the branded Synthroid. She has always been crushing this, mixing it with water, as she had trouble swallowing the pill when she tried swallowing it whole. She has been taking it this way for many years. Her TSH in November was borderline, didn't develop any symptoms until she called in December.  At that time she complained of skin being very dry, hair is falling out more than usual as well as her eyebrows. She has also noticed and increase in fatigue.  Dose was then increased to 50 mcg. She had TSH rechecked with recent labs.  Lab Results  Component Value Date   TSH 3.244 12/26/2015   Symptoms have improved (energy/skin/hair).  Denies any change in weight, moods, bowels,  temperature intolerance. She has had some issues with nail splitting over the years. Biotin has helped, but periodically has a problem (occurs just once a year usually, in the late Spring/early summer). This has been addressed with Dr. LuAllyson Sabals well. Dry skin has been improving. Not currently bothering her.  [facility]   Immunization History  Administered Date(s) Administered  . Influenza Split 08/26/2013, 09/08/2014, 08/17/2015  . Pneumococcal Conjugate-13 08/26/2014  . Pneumococcal Polysaccharide-23 12/27/2000, 07/27/2008  . Td 03/26/2005  . Tdap 03/05/2012  . Zoster 02/25/2011   Last Pap smear: 10/2013 Last mammogram: 08/2014 Last colonoscopy: 8/06 Last DEXA: 04/2014--osteopenia, stable/unchanged from 2013 Dentist: every 6 months Ophtho: yearly Exercise: walks 3x/week, 2 miles, some weights at home  Other doctors caring for patient include: Dentist: [facility] Ophtho: Dr. ShGershon CraneNT: Dr. TeBenjamine Molar. [facility], as well as [facility]: Dr. LuAllyson SabalI: Sadie Haber Depression screen: negative Fall screen: none in the last year Functional Status Screen: notable for occasional stress urinary incontinence, decreased hearing, tinnitus, wears hearing aids.  End of Life Discussion:  Patient has a living will and medical power of attorney  Past Medical History  Diagnosis Date  . Diabetes mellitus 2002  . Hypertension   . Pure hypercholesterolemia   . [place]    RLE with in-transit mets; s/p hyperthermic limb perfusion chemo and excision; re-excision of recurrence 2011  . Osteoporosis   . Diabetic neuropathy ([place]  . 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 Baker's cyst of knee     R popliteal  . Pulmonary nodules     small, seen on CT 12/09   . Cholelithiasis 12/09    asymptomatic, noticed on CT 12/09  . Alopecia     anterior hair loss  . Diverticulosis of colon   . Anxiety and depression     related to work--resolved  . Metatarsal fracture 10/2009    L 5th--Dr. Rip Harbour  . Hearing loss     hearing aids bilaterally    Past Surgical History  Procedure Laterality Date  . Tonsillectomy and adenoidectomy    . Excision of melanoma  11/23/08    wide excision of melanoma and R inguinal sentinel LN mapping and  biopsy  . Excision of recurrent melanoma  2011, 06/2014    RLE  . Cataract extraction, bilateral  01/2011  . Tympanostomy tube placement  2013    L ear  (Dr. Benjamine Mola)    Social History   Social History  . Marital Status: Divorced    Spouse Name: N/A  . Number of Children: 2  . Years of Education: N/A   Occupational History  . 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 Smoking status: Never Smoker   . Smokeless tobacco: Never Used  . Alcohol Use: 0.0 oz/week    0 Standard drinks or equivalent per week     Comment: 4 oz wine once or twice a week  . Drug Use: No  . Sexual Activity: Not Currently   Other Topics Concern  . Not on file   Social History Narrative   Daughter in [place] and son in [place]. 4 grandchildren.  Lives alone    Family History  Problem Relation Age of Onset  . Hypertension Mother   . Stroke Mother   . Arthritis Father   . Hemophilia Father   . Cancer Other     breast  . Diabetes Neg Hx   . Heart disease Neg Hx     Outpatient Encounter Prescriptions as of 01/18/2016  Medication Sig Note  . aspirin 81 MG tablet Take 81 mg by mouth every other day.  12/02/2014: Takes it 3x/week (due to bleeding if taking more often; bled a lot with recent dentalwork  . BIOTIN PO Take 1 tablet by mouth daily.   . Calcium Carbonate-Vitamin D (CALCIUM + D) 600-200 MG-UNIT TABS Take 1 tablet by mouth daily.    . Cholecalciferol (VITAMIN D) 1000 UNITS capsule Take 2,000 Units  by mouth daily.     . Coenzyme Q10 (CO Q 10) 100 MG CAPS Take 1 capsule by mouth daily.   . fish oil-omega-3 fatty acids 1000 MG capsule Take 3 g by mouth daily.    . Insulin Pen Needle (BD PEN NEEDLE NANO U/F) 32G X 4 MM MISC USE AS DIRECTED WITH BYETTA(TWICE DAILY)   . losartan (COZAAR) 100 MG tablet Take 1 tablet (100 mg total) by mouth daily.   . magnesium gluconate (MAGONATE) 500 MG tablet Take 500 mg by mouth 2 (two) times daily.     . metFORMIN (GLUCOPHAGE) 1000 MG tablet Take 1 tablet by mouth two  times daily with meals   . Multiple Vitamins-Minerals (MULTIVITAMIN WITH MINERALS) tablet Take 1 tablet by mouth daily.     . pioglitazone (ACTOS) 15 MG tablet Take 1 tablet (15 mg total) by mouth daily.   . simvastatin (ZOCOR) 20 MG tablet  Take 1 tablet by mouth  daily   . SYNTHROID 50 MCG tablet Take 1 tablet (50 mcg total) by mouth daily before breakfast.   . vitamin C (ASCORBIC ACID) 500 MG tablet Take 500 mg by mouth daily.     . Exenatide ER (BYDUREON) 2 MG PEN Inject 2 mg into the skin every 7 (seven) days. (Patient not taking: Reported on 01/18/2016) 01/18/2016: Finishing up the rest of her Byetta, then will start the Bydureon   No facility-administered encounter medications on file as of 01/18/2016.    No Known Allergies  ROS: The patient denies anorexia, fever, weight changes, headaches, vision changes, ear pain, sore throat, breast concerns, chest pain, palpitations, dizziness, syncope, dyspnea on exertion, cough, swelling, nausea, vomiting, diarrhea, abdominal pain, melena, hematochezia, indigestion/heartburn, hematuria, incontinence (just with cough/sneeze), dysuria,vaginal bleeding, discharge, odor or itch, genital lesions, joint pains, weakness, tremor, suspicious skin lesions, depression, anxiety, abnormal bleeding/bruising (mild, chronic), or enlarged lymph nodes. Constipation, unchanged/chronic--controlled Hearing loss L>R ear, unchanged. Has hearing aids. +tingling/mild  burning in feet, chronic, unchanged   PHYSICAL EXAM:  BP 150/70 mmHg  Pulse 80  Ht 5' (1.524 m)  Wt 171 lb 6.4 oz (77.747 kg)  BMI 33.47 kg/m2 134/64 on repeat by MD   General Appearance:   Alert, cooperative, no distress, appears stated age  Head:   Normocephalic, without obvious abnormality, atraumatic. Thinning of hair anteriorly  Eyes:   PERRL, conjunctiva/corneas clear, EOM's intact, fundi   benign  Ears:   Normal externally. Wearing hearing aids bilaterally. TM's and EAC's normal  Nose:  Nares normal, mucosa normal, no drainage or sinus tenderness  Throat:  Lips, mucosa, and tongue normal; teeth and gums normal  Neck:  Supple, no lymphadenopathy; thyroid: no enlargement/tenderness/nodules; no carotid  bruit or JVD  Back:  Spine nontender, no curvature, ROM normal, no CVA tenderness  Lungs:   Clear to auscultation bilaterally without wheezes, rales or ronchi; respirations unlabored  Chest Wall:   No tenderness or deformity  Heart:   Regular rate and rhythm, S1 and S2 normal, 2/6 systolic ejection murmur is present; no rub or gallop  Breast Exam:   No tenderness, masses, or nipple discharge or inversion. No axillary lymphadenopathy  Abdomen:   Soft, non-tender, nondistended, normoactive bowel sounds,   no masses, no hepatosplenomegaly. Two small bruises noted on abdomen (Byetta injection sites)  Genitalia:   Normal external genitalia without lesions, atrophic changes noted. BUS and vagina normal; no cervical motion tenderness.No abnormal vaginal discharge. Uterus and adnexa not enlarged, nontender, no masses; exam somewhat limited by body habitus. Pap not performed  Rectal:   Normal tone, no masses or tenderness; no stool in vault for heme testing  Extremities:  No clubbing, cyanosis or edema  Pulses:  2+ and symmetric all extremities  Skin:  Skin color,  texture, turgor normal, no rashes or lesions. Normal sensation to monofilament.some thickening and discoloration of the toenails, unchanged  Lymph nodes:  Cervical, supraclavicular, and axillary nodes normal  Neurologic:  CNII-XII intact, normal strength, sensation and gait; reflexes 2+ and symmetric throughout   Psych: Normal mood, affect, hygiene and grooming       Lab Results  Component Value Date   TSH 3.244 12/26/2015   Lab Results  Component Value Date   WBC 7.5 12/26/2015   HGB 13.9 12/26/2015   HCT 41.9 12/26/2015   MCV 89.0 12/26/2015   PLT 283 12/26/2015   Lab Results  Component Value Date   HGBA1C 6.7* 12/26/2015  Chemistry      Component Value Date/Time   NA 140 12/26/2015 0001   K 4.3 12/26/2015 0001   CL 99 12/26/2015 0001   CO2 31 12/26/2015 0001   BUN 14 12/26/2015 0001   CREATININE 0.87 12/26/2015 0001      Component Value Date/Time   CALCIUM 9.9 12/26/2015 0001   CALCIUM 9.7 04/23/2012 0915   ALKPHOS 48 12/26/2015 0001   AST 20 12/26/2015 0001   ALT 17 12/26/2015 0001   BILITOT 0.5 12/26/2015 0001     Fasting glucose 120   Microalbumin/Cr ratio elevated at 87 (higher than in the past)   ASSESSMENT/PLAN:  Medicare annual wellness visit, subsequent  Pure hypercholesterolemia - at goal per August labs; continue current regimen  Essential hypertension, benign - controlled  Hypothyroidism, unspecified hypothyroidism type - adequately replaced  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 (thought she needed simvastatin, but  this was refilled same date as 2 other meds, which she isn't reporting needing.  Looks like losartan should be due to be refilled, she doesn't think she needs).  Synthroid to [place]  Discussed monthly self breast exams and yearly mammograms; at least 30 minutes of aerobic activity at least 5 days/week and weight-bearing exercise 2x/week; proper sunscreen use reviewed; healthy diet, including goals of calcium and vitamin D intake and alcohol recommendations (less than or equal to 1 drink/day) reviewed; regular seatbelt use; changing batteries in smoke detectors. Immunization recommendations discussed, UTD. Colonoscopy recommendations reviewed, was due 06/2015. Discussed colonoscopy vs cologard--refer for cologard.    Please start checking your blood sugars regularly, especially after changing over to Bydureon Schedule your mammogram (was due in October 2016). Bone density will be due in 04/2016. Please get [place] copies of your living will and healthcare power of attorney at your convenience.   F/u 6 months, fasting labs prior--A1c, lipid, TSH, c-met, urine microalb/Cr   Medicare Attestation I have personally reviewed: The patient's medical and social history Their use of alcohol, tobacco or illicit drugs Their current medications and supplements The patient's functional ability including ADLs,fall risks, home safety risks, cognitive, and hearing and visual impairment Diet and physical activities Evidence for depression or mood disorders  The patient's weight, height, and BMI have been recorded in the chart.  I have made referrals, counseling, and provided education to the patient based on review of the above and I have provided the patient with a written personalized care plan for preventive services.     Kaye Luoma A, MD   01/18/2016

## 2016-01-18 NOTE — Patient Instructions (Addendum)
HEALTH MAINTENANCE RECOMMENDATIONS:  It is recommended that you get at least 30 minutes of aerobic exercise at least 5 days/week (for weight loss, you may need as much as 60-90 minutes). This can be any activity that gets your heart rate up. This can be divided in 10-15 minute intervals if needed, but try and build up your endurance at least once a week.  Weight bearing exercise is also recommended twice weekly.  Eat a healthy diet with lots of vegetables, fruits and fiber.  "Colorful" foods have a lot of vitamins (ie green vegetables, tomatoes, red peppers, etc).  Limit sweet tea, regular sodas and alcoholic beverages, all of which has a lot of calories and sugar.  Up to 1 alcoholic drink daily may be beneficial for women (unless trying to lose weight, watch sugars).  Drink a lot of water.  Calcium recommendations are 1200-1500 mg daily (1500 mg for postmenopausal women or women without ovaries), and vitamin D 1000 IU daily.  This should be obtained from diet and/or supplements (vitamins), and calcium should not be taken all at once, but in divided doses.  Monthly self breast exams and yearly mammograms for women over the age of 4 is recommended.  Sunscreen of at least SPF 30 should be used on all sun-exposed parts of the skin when outside between the hours of 10 am and 4 pm (not just when at beach or pool, but even with exercise, golf, tennis, and yard work!)  Use a sunscreen that says "broad spectrum" so it covers both UVA and UVB rays, and make sure to reapply every 1-2 hours.  Remember to change the batteries in your smoke detectors when changing your clock times in the spring and fall.  Use your seat belt every time you are in a car, and please drive safely and not be distracted with cell phones and texting while driving.   Ms. Dessources , Thank you for taking time to come for your Medicare Wellness Visit. I appreciate your ongoing commitment to your health goals. Please review the  following plan we discussed and let me know if I can assist you in the future.   These are the goals we discussed: Goals    None      This is a list of the screening recommended for you and due dates:  Health Maintenance  Topic Date Due  . Eye exam for diabetics  07/15/1946  . Complete foot exam   12/03/2015  . Hemoglobin A1C  06/24/2016  . Flu Shot  06/26/2016  . Tetanus Vaccine  03/05/2022  . DEXA scan (bone density measurement)  Completed  . Shingles Vaccine  Completed  . Pneumonia vaccines  Completed    I know that you are up to date on your eye exams--please have Dr. Gershon Crane send [place] reports when you go for your diabetes exam annually. Your foot exam was done today. Your bone density will be due to be rechecked in 04/2016 (since you have some thinning, we need to continue to monitor it every 2-3 years). Your mammogram was due 08/2015.  Please call to schedule this. You were due for colonoscopy in 06/2015. We are going to refer you for Cologard testing (this should come in the mail) for fecal DNA evaluation as a different way of screening for colon cancer.  If this is negative, we can do this every 3 years. If it is abnormal, you will need to have a colonoscopy.  Please get [place] copies of your living will  and healthcare power of attorney.  Please start checking your blood sugars regularly, especially after changing over to Bydureon  Please call us when you need any refills. We sent rx to [facility] today for Synthroid.  (other name for pioglitazone is Actos).

## 2016-02-01 ENCOUNTER — Other Ambulatory Visit: Payer: Self-pay | Admitting: *Deleted

## 2016-02-01 MED ORDER — SIMVASTATIN 20 MG PO TABS: ORAL_TABLET | ORAL | Status: DC

## 2016-02-07 DIAGNOSIS — R911 Solitary pulmonary nodule: Secondary | ICD-10-CM | POA: Diagnosis not present

## 2016-02-07 DIAGNOSIS — C439 Malignant melanoma of skin, unspecified: Secondary | ICD-10-CM | POA: Diagnosis not present

## 2016-02-07 DIAGNOSIS — C4371 Malignant melanoma of right lower limb, including hip: Secondary | ICD-10-CM | POA: Diagnosis not present

## 2016-02-07 DIAGNOSIS — R918 Other nonspecific abnormal finding of lung field: Secondary | ICD-10-CM | POA: Diagnosis not present

## 2016-02-15 DIAGNOSIS — Z85820 Personal history of malignant melanoma of skin: Secondary | ICD-10-CM | POA: Diagnosis not present

## 2016-02-16 DIAGNOSIS — Z1211 Encounter for screening for malignant neoplasm of colon: Secondary | ICD-10-CM | POA: Diagnosis not present

## 2016-02-16 DIAGNOSIS — Z1212 Encounter for screening for malignant neoplasm of rectum: Secondary | ICD-10-CM | POA: Diagnosis not present

## 2016-02-16 LAB — COLOGUARD: COLOGUARD: NEGATIVE

## 2016-02-22 DIAGNOSIS — Z1231 Encounter for screening mammogram for malignant neoplasm of breast: Secondary | ICD-10-CM | POA: Diagnosis not present

## 2016-02-22 LAB — HM MAMMOGRAPHY: HM MAMMO: NEGATIVE

## 2016-02-23 ENCOUNTER — Encounter: Payer: Self-pay | Admitting: Family Medicine

## 2016-02-27 ENCOUNTER — Other Ambulatory Visit: Payer: Self-pay | Admitting: Family Medicine

## 2016-03-06 ENCOUNTER — Other Ambulatory Visit: Payer: Self-pay | Admitting: Family Medicine

## 2016-04-02 ENCOUNTER — Encounter: Payer: Self-pay | Admitting: *Deleted

## 2016-04-04 DIAGNOSIS — H9313 Tinnitus, bilateral: Secondary | ICD-10-CM | POA: Diagnosis not present

## 2016-04-04 DIAGNOSIS — H903 Sensorineural hearing loss, bilateral: Secondary | ICD-10-CM | POA: Diagnosis not present

## 2016-04-16 ENCOUNTER — Other Ambulatory Visit: Payer: Self-pay | Admitting: *Deleted

## 2016-04-16 MED ORDER — METFORMIN HCL 1000 MG PO TABS: ORAL_TABLET | ORAL | Status: DC

## 2016-04-16 MED ORDER — PIOGLITAZONE HCL 15 MG PO TABS: ORAL_TABLET | ORAL | Status: DC

## 2016-04-24 ENCOUNTER — Other Ambulatory Visit: Payer: Self-pay | Admitting: Family Medicine

## 2016-04-26 ENCOUNTER — Encounter: Payer: Self-pay | Admitting: Family Medicine

## 2016-04-26 ENCOUNTER — Telehealth: Payer: Self-pay

## 2016-04-26 DIAGNOSIS — Z961 Presence of intraocular lens: Secondary | ICD-10-CM | POA: Diagnosis not present

## 2016-04-26 DIAGNOSIS — E119 Type 2 diabetes mellitus without complications: Secondary | ICD-10-CM | POA: Diagnosis not present

## 2016-04-26 LAB — HM DIABETES EYE EXAM

## 2016-04-26 NOTE — Telephone Encounter (Signed)
Error

## 2016-05-11 ENCOUNTER — Other Ambulatory Visit: Payer: Self-pay | Admitting: Family Medicine

## 2016-05-16 DIAGNOSIS — C792 Secondary malignant neoplasm of skin: Secondary | ICD-10-CM | POA: Diagnosis not present

## 2016-05-16 DIAGNOSIS — Z85820 Personal history of malignant melanoma of skin: Secondary | ICD-10-CM | POA: Diagnosis not present

## 2016-05-22 DIAGNOSIS — D1801 Hemangioma of skin and subcutaneous tissue: Secondary | ICD-10-CM | POA: Diagnosis not present

## 2016-05-22 DIAGNOSIS — D485 Neoplasm of uncertain behavior of skin: Secondary | ICD-10-CM | POA: Diagnosis not present

## 2016-05-22 DIAGNOSIS — C4371 Malignant melanoma of right lower limb, including hip: Secondary | ICD-10-CM | POA: Diagnosis not present

## 2016-06-04 ENCOUNTER — Other Ambulatory Visit: Payer: Self-pay | Admitting: Family Medicine

## 2016-06-20 ENCOUNTER — Other Ambulatory Visit: Payer: Self-pay | Admitting: Family Medicine

## 2016-06-20 DIAGNOSIS — I10 Essential (primary) hypertension: Secondary | ICD-10-CM

## 2016-06-22 DIAGNOSIS — R2241 Localized swelling, mass and lump, right lower limb: Secondary | ICD-10-CM | POA: Diagnosis not present

## 2016-06-22 DIAGNOSIS — Z85820 Personal history of malignant melanoma of skin: Secondary | ICD-10-CM | POA: Diagnosis not present

## 2016-06-27 ENCOUNTER — Other Ambulatory Visit: Payer: Self-pay | Admitting: Family Medicine

## 2016-07-02 DIAGNOSIS — C439 Malignant melanoma of skin, unspecified: Secondary | ICD-10-CM | POA: Diagnosis not present

## 2016-07-05 DIAGNOSIS — R918 Other nonspecific abnormal finding of lung field: Secondary | ICD-10-CM | POA: Diagnosis not present

## 2016-07-05 DIAGNOSIS — C439 Malignant melanoma of skin, unspecified: Secondary | ICD-10-CM | POA: Diagnosis not present

## 2016-07-10 DIAGNOSIS — C4371 Malignant melanoma of right lower limb, including hip: Secondary | ICD-10-CM | POA: Diagnosis not present

## 2016-07-10 DIAGNOSIS — Z79899 Other long term (current) drug therapy: Secondary | ICD-10-CM | POA: Diagnosis not present

## 2016-07-10 DIAGNOSIS — E039 Hypothyroidism, unspecified: Secondary | ICD-10-CM | POA: Diagnosis not present

## 2016-07-10 DIAGNOSIS — E119 Type 2 diabetes mellitus without complications: Secondary | ICD-10-CM | POA: Diagnosis not present

## 2016-07-10 DIAGNOSIS — Z7982 Long term (current) use of aspirin: Secondary | ICD-10-CM | POA: Diagnosis not present

## 2016-07-10 DIAGNOSIS — I10 Essential (primary) hypertension: Secondary | ICD-10-CM | POA: Diagnosis not present

## 2016-07-10 DIAGNOSIS — L905 Scar conditions and fibrosis of skin: Secondary | ICD-10-CM | POA: Diagnosis not present

## 2016-07-10 DIAGNOSIS — D2371 Other benign neoplasm of skin of right lower limb, including hip: Secondary | ICD-10-CM | POA: Diagnosis not present

## 2016-07-10 DIAGNOSIS — E785 Hyperlipidemia, unspecified: Secondary | ICD-10-CM | POA: Diagnosis not present

## 2016-07-10 DIAGNOSIS — Z888 Allergy status to other drugs, medicaments and biological substances status: Secondary | ICD-10-CM | POA: Diagnosis not present

## 2016-07-10 DIAGNOSIS — Z7984 Long term (current) use of oral hypoglycemic drugs: Secondary | ICD-10-CM | POA: Diagnosis not present

## 2016-07-11 ENCOUNTER — Other Ambulatory Visit: Payer: Self-pay | Admitting: Family Medicine

## 2016-07-11 DIAGNOSIS — C4371 Malignant melanoma of right lower limb, including hip: Secondary | ICD-10-CM | POA: Diagnosis not present

## 2016-07-11 DIAGNOSIS — Z483 Aftercare following surgery for neoplasm: Secondary | ICD-10-CM | POA: Diagnosis not present

## 2016-07-16 ENCOUNTER — Other Ambulatory Visit: Payer: Medicare Other

## 2016-07-18 ENCOUNTER — Encounter: Payer: Medicare Other | Admitting: Family Medicine

## 2016-07-26 ENCOUNTER — Other Ambulatory Visit: Payer: Self-pay | Admitting: *Deleted

## 2016-07-26 MED ORDER — EXENATIDE ER 2 MG ~~LOC~~ PEN: 2.0000 mg | PEN_INJECTOR | SUBCUTANEOUS | 0 refills | Status: DC

## 2016-08-01 DIAGNOSIS — C792 Secondary malignant neoplasm of skin: Secondary | ICD-10-CM | POA: Diagnosis not present

## 2016-08-01 DIAGNOSIS — C4371 Malignant melanoma of right lower limb, including hip: Secondary | ICD-10-CM | POA: Diagnosis not present

## 2016-08-09 ENCOUNTER — Other Ambulatory Visit: Payer: Self-pay | Admitting: *Deleted

## 2016-08-09 MED ORDER — SIMVASTATIN 20 MG PO TABS: 20.0000 mg | ORAL_TABLET | Freq: Every day | ORAL | 0 refills | Status: DC

## 2016-08-20 ENCOUNTER — Other Ambulatory Visit: Payer: Self-pay | Admitting: Family Medicine

## 2016-08-20 ENCOUNTER — Other Ambulatory Visit: Payer: Medicare Other

## 2016-08-20 DIAGNOSIS — E1149 Type 2 diabetes mellitus with other diabetic neurological complication: Secondary | ICD-10-CM

## 2016-08-20 DIAGNOSIS — E119 Type 2 diabetes mellitus without complications: Secondary | ICD-10-CM | POA: Diagnosis not present

## 2016-08-20 DIAGNOSIS — E7800 Pure hypercholesterolemia, unspecified: Secondary | ICD-10-CM | POA: Diagnosis not present

## 2016-08-20 DIAGNOSIS — Z23 Encounter for immunization: Secondary | ICD-10-CM | POA: Diagnosis not present

## 2016-08-20 DIAGNOSIS — E1129 Type 2 diabetes mellitus with other diabetic kidney complication: Secondary | ICD-10-CM

## 2016-08-20 DIAGNOSIS — R809 Proteinuria, unspecified: Secondary | ICD-10-CM | POA: Diagnosis not present

## 2016-08-20 DIAGNOSIS — I10 Essential (primary) hypertension: Secondary | ICD-10-CM | POA: Diagnosis not present

## 2016-08-20 DIAGNOSIS — E039 Hypothyroidism, unspecified: Secondary | ICD-10-CM | POA: Diagnosis not present

## 2016-08-20 LAB — LIPID PANEL
CHOLESTEROL: 137 mg/dL (ref 125–200)
HDL: 59 mg/dL (ref >=46)
LDL Cholesterol: 58 mg/dL (ref <130)
TRIGLYCERIDES: 102 mg/dL (ref <150)
Total CHOL/HDL Ratio: 2.3 Ratio (ref <=5.0)
VLDL: 20 mg/dL (ref <30)

## 2016-08-20 LAB — COMPREHENSIVE METABOLIC PANEL
ALBUMIN: 4 g/dL (ref 3.6–5.1)
ALK PHOS: 42 U/L (ref 33–130)
ALT: 13 U/L (ref 6–29)
AST: 19 U/L (ref 10–35)
BUN: 13 mg/dL (ref 7–25)
CALCIUM: 9.5 mg/dL (ref 8.6–10.4)
CO2: 23 mmol/L (ref 20–31)
Chloride: 101 mmol/L (ref 98–110)
Creat: 0.91 mg/dL (ref 0.60–0.93)
Glucose, Bld: 106 mg/dL — ABNORMAL HIGH (ref 65–99)
POTASSIUM: 4 mmol/L (ref 3.5–5.3)
Sodium: 138 mmol/L (ref 135–146)
TOTAL PROTEIN: 6.3 g/dL (ref 6.1–8.1)
Total Bilirubin: 0.4 mg/dL (ref 0.2–1.2)

## 2016-08-20 LAB — HEMOGLOBIN A1C
HEMOGLOBIN A1C: 5.8 % — AB (ref <5.7)
MEAN PLASMA GLUCOSE: 120 mg/dL

## 2016-08-20 LAB — TSH: TSH: 4 m[IU]/L

## 2016-08-21 LAB — MICROALBUMIN / CREATININE URINE RATIO
CREATININE, URINE: 101 mg/dL (ref 20–320)
Microalb Creat Ratio: 56 mcg/mg creat — ABNORMAL HIGH (ref <30)
Microalb, Ur: 5.7 mg/dL

## 2016-08-21 NOTE — Progress Notes (Signed)
Chief Complaint  Patient presents with  . Diabetes    nonfasting med check, labs already done.    DM: Blood sugars have been running 92-135.  She was changed from byetta to [facility] about 6 months ago (for cost issues). Cost went up from $35/month, and is now up to over $200 (unsure if she is in donut hole). She hasn't had side effects, doing very well with good control, and likes having to have fewer injections. No hypoglycemia, polydipsia or polyuria. Has some neuropathy--tingling in feet (not in hands), a mild burning, not painful or bothersome, unchanged/chronic. Last eye exam was earlier this year, scheduled for next year.  She checks her feet regularly, no sores/concerns.  Hyperlipidemia--She is compliant with her medications, taking 20mg  of simvastatin and 3000mg  of fish oil. Denies any side effects to the medications, and is following a lowfat, low cholesterol diet.  Hypertension follow-up: Blood pressures elsewhere are 124-135/70. Denies dizziness, headaches, chest pain. Denies side effects of medications.  Hypothyroidism: She denies any missed pills, still taking the branded Synthroid. She has always been crushing this, mixing it with water, as she had trouble swallowing the pill when she tried swallowing it whole. She has been taking it this way for many years. Her TSH in November 2016 was borderline, didn't develop any symptoms until she called in December 2016.  At that time she complained of skin being very dry, hair is falling out more than usual as well as her eyebrows. She has also noticed an increase in fatigue.  Dose was then increased to 50 mcg.  F/u TSH in January was normal.  Symptoms remain improved (energy/skin/hair) since the dose increase.  Denies any change in weight, moods, bowels, temperature intolerance. She has had some issues with nail splitting over the years. Biotin has helped, but periodically has a problem (occurs just once a year usually, in the late  Spring/early summer). This has been addressed with Dr. Allyson Sabal as well.   Melanoma--She had another melanoma removed from her RLL on 07/10/2016.  Discussion about interferon and other systemic treatments were discussed, but they (she and daughter) were not interested in this now.  PMH, PSH, SH reviewed  Outpatient Encounter Prescriptions as of 08/22/2016  Medication Sig Note  . BIOTIN PO Take 1 tablet by mouth daily.   Marland Kitchen BYDUREON 2 MG PEN INJECT 2 MG UNDER THE SKIN EVERY 7 DAYS   . Calcium Carbonate-Vitamin D (CALCIUM + D) 600-200 MG-UNIT TABS Take 1 tablet by mouth daily.    . Cholecalciferol (VITAMIN D) 1000 UNITS capsule Take 2,000 Units by mouth daily.     . Coenzyme Q10 (CO Q 10) 100 MG CAPS Take 1 capsule by mouth daily.   . fish oil-omega-3 fatty acids 1000 MG capsule Take 3 g by mouth daily.    . Insulin Pen Needle (BD PEN NEEDLE NANO U/F) 32G X 4 MM MISC USE AS DIRECTED WITH BYETTA(TWICE DAILY)   . losartan (COZAAR) 100 MG tablet TAKE 1 TABLET(100 MG) BY MOUTH DAILY   . magnesium gluconate (MAGONATE) 500 MG tablet Take 500 mg by mouth 2 (two) times daily.     . metFORMIN (GLUCOPHAGE) 1000 MG tablet Take 1 tablet by mouth two  times daily with meals   . Multiple Vitamins-Minerals (MULTIVITAMIN WITH MINERALS) tablet Take 1 tablet by mouth daily.     . pioglitazone (ACTOS) 15 MG tablet Take 1 tablet by mouth  daily   . simvastatin (ZOCOR) 20 MG tablet Take 1 tablet (  20 mg total) by mouth daily.   Marland Kitchen SYNTHROID 50 MCG tablet Take 1 tablet (50 mcg total) by mouth daily before breakfast.   . vitamin C (ASCORBIC ACID) 500 MG tablet Take 500 mg by mouth daily.     Marland Kitchen aspirin 81 MG tablet Take 81 mg by mouth every other day.  08/22/2016: Stopped taking it in mid-August after surgery on leg (had bleeding)  . [DISCONTINUED] losartan (COZAAR) 100 MG tablet TAKE 1 TABLET(100 MG) BY MOUTH DAILY    No facility-administered encounter medications on file as of 08/22/2016.    No Known Allergies  ROS:  no fever, chills, headaches, dizziness, URI symptoms, chest pain, shortness of breath.  Slightly shortness of breath with a lot of activity, feels like she isn't in shape, intermittent. No GI or GU complaints. Denies depression, rash or other concerns except as noted in HPI  PHYSICAL EXAM:  BP 132/78 (BP Location: Left Arm, Patient Position: Sitting, Cuff Size: Normal)   Pulse 84   Ht 5' (1.524 m)   Wt 171 lb (77.6 kg)   BMI 33.40 kg/m  Well developed, pleasant female, in good spirits HEENT: conjunctiva and sclera are clear, OP clear Neck: no lymphadenopathy, thyromegaly or mass Heart: regular rate and rhythm Lungs: clear bilaterally Back: no CVA or spinal tenderness Abdomen: soft, nontender, no organomegaly or mass Extremities: trace edema, normal pulses ,normal sensation. Vertical incision, healing well, at right medial lower leg.  Smaller vertical incision closer to the knee, also healing well from recent surgery   Lab Results  Component Value Date   HGBA1C 5.8 (H) 08/20/2016   Lab Results  Component Value Date   TSH 4.00 08/20/2016   Lab Results  Component Value Date   CHOL 137 08/20/2016   HDL 59 08/20/2016   LDLCALC 58 08/20/2016   TRIG 102 08/20/2016   CHOLHDL 2.3 08/20/2016     Chemistry      Component Value Date/Time   NA 138 08/20/2016 0758   K 4.0 08/20/2016 0758   CL 101 08/20/2016 0758   CO2 23 08/20/2016 0758   BUN 13 08/20/2016 0758   CREATININE 0.91 08/20/2016 0758      Component Value Date/Time   CALCIUM 9.5 08/20/2016 0758   CALCIUM 9.7 04/23/2012 0915   ALKPHOS 42 08/20/2016 0758   AST 19 08/20/2016 0758   ALT 13 08/20/2016 0758   BILITOT 0.4 08/20/2016 0758      Fasting glucose 106  Urine microalbumin/Cr ratio elevated at 56  ASSESSMENT/PLAN:  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 Continue ARB  Pure hypercholesterolemia - lipids at goal  on current regimen, continue  Essential hypertension, benign - controlled; continue current regimen  Hypothyroidism, unspecified hypothyroidism type - euthyroid by history. Borderline TSH. 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 f/u as scheduled with onc, derm, surgeons  To check with drug company for pt assistance through donut hole; sample given. Can check her Lynda Rainwater book to see if other meds are preferred (if not donut hole issue); generic nesina may be a less expensive alternative, (can go back to Bydureon in January if she prefers the injection weekly over a daily pill).  She will check into this and let us know.

## 2016-08-22 ENCOUNTER — Encounter: Payer: Self-pay | Admitting: Family Medicine

## 2016-08-22 ENCOUNTER — Ambulatory Visit (INDEPENDENT_AMBULATORY_CARE_PROVIDER_SITE_OTHER): Payer: Medicare Other | Admitting: Family Medicine

## 2016-08-22 ENCOUNTER — Telehealth: Payer: Self-pay | Admitting: Family Medicine

## 2016-08-22 VITALS — BP 132/78 | HR 84 | Ht 60.0 in | Wt 171.0 lb

## 2016-08-22 DIAGNOSIS — Z5181 Encounter for therapeutic drug level monitoring: Secondary | ICD-10-CM | POA: Diagnosis not present

## 2016-08-22 DIAGNOSIS — E7800 Pure hypercholesterolemia, unspecified: Secondary | ICD-10-CM

## 2016-08-22 DIAGNOSIS — I10 Essential (primary) hypertension: Secondary | ICD-10-CM

## 2016-08-22 DIAGNOSIS — E039 Hypothyroidism, unspecified: Secondary | ICD-10-CM

## 2016-08-22 DIAGNOSIS — C439 Malignant melanoma of skin, unspecified: Secondary | ICD-10-CM | POA: Diagnosis not present

## 2016-08-22 DIAGNOSIS — E119 Type 2 diabetes mellitus without complications: Secondary | ICD-10-CM

## 2016-08-22 DIAGNOSIS — E1149 Type 2 diabetes mellitus with other diabetic neurological complication: Secondary | ICD-10-CM | POA: Diagnosis not present

## 2016-08-22 DIAGNOSIS — R809 Proteinuria, unspecified: Secondary | ICD-10-CM | POA: Diagnosis not present

## 2016-08-22 DIAGNOSIS — E1129 Type 2 diabetes mellitus with other diabetic kidney complication: Secondary | ICD-10-CM

## 2016-08-22 NOTE — Telephone Encounter (Signed)
Scheduled pts medcheck + and put her on the cancelation list, she is coming in April the 26th 2018 and wants to come in April the 23 for her fasting labs if that is ok,

## 2016-08-22 NOTE — Telephone Encounter (Signed)
Orders entered

## 2016-08-22 NOTE — Patient Instructions (Signed)
Please look into whether or not the Bydureon drug company offers patient assistance.  Many times they have programs to help patients get through the donut hole.  If you are in the donut hole, the cost should decrease again in January. We are happy to help with samples when we can.  Check your AARP book.  If you aren't in the donut hole, but the formulary has changed and Bydureon is no longer the preferred agent, let me know which other medications are preferred.  Trulicity is a similar agent.  Victoza is as well, but is daily, not weekly.  The other pills you listed (ie [place], onglyza, tradjenta, nesina) would also be options.  I believe the Laurey Morale would be the least expensive as it is now generic.

## 2016-09-27 ENCOUNTER — Other Ambulatory Visit: Payer: Self-pay | Admitting: Family Medicine

## 2016-09-30 ENCOUNTER — Other Ambulatory Visit: Payer: Self-pay | Admitting: Family Medicine

## 2016-10-15 ENCOUNTER — Telehealth: Payer: Self-pay

## 2016-10-15 MED ORDER — METFORMIN HCL 1000 MG PO TABS: ORAL_TABLET | ORAL | 1 refills | Status: DC

## 2016-10-15 MED ORDER — PIOGLITAZONE HCL 15 MG PO TABS
15.0000 mg | ORAL_TABLET | Freq: Every day | ORAL | 1 refills | Status: DC
Start: 2016-10-15 — End: 2017-03-21

## 2016-10-15 NOTE — Telephone Encounter (Signed)
Pt request refill of glucophage and pioglitazone to [facility].

## 2016-10-15 NOTE — Telephone Encounter (Signed)
Done

## 2016-10-29 ENCOUNTER — Encounter: Payer: Self-pay | Admitting: Family Medicine

## 2016-10-29 ENCOUNTER — Ambulatory Visit (INDEPENDENT_AMBULATORY_CARE_PROVIDER_SITE_OTHER): Payer: Medicare Other | Admitting: Family Medicine

## 2016-10-29 VITALS — BP 128/72 | HR 80 | Temp 98.7°F | Ht 60.0 in | Wt 169.0 lb

## 2016-10-29 DIAGNOSIS — J069 Acute upper respiratory infection, unspecified: Secondary | ICD-10-CM | POA: Diagnosis not present

## 2016-10-29 DIAGNOSIS — J209 Acute bronchitis, unspecified: Secondary | ICD-10-CM

## 2016-10-29 MED ORDER — AZITHROMYCIN 250 MG PO TABS: ORAL_TABLET | ORAL | 0 refills | Status: DC

## 2016-10-29 NOTE — Patient Instructions (Signed)
  Take the antibiotic as directed.  Remember that it works for 10 days, even though you take it for 5.  Call us after 10 days if not completely better--call sooner if worse despite the medication (fever, persistent discolored phlegm and worsening cough).  Continue to drink plenty of water. You may continue coricidin. Guaifenesin is the expectorant found in mucinex, robitussin and others--this is good to take to loosen up the phlegm. It is also okay to take dextromethorphan (DM portion of many cough medications--double check your coridicin to make sure that you aren't getting duplicate ingredients from multiple medications).

## 2016-10-29 NOTE — Progress Notes (Signed)
Chief Complaint  Patient presents with  . Cough    symptoms started Sunday 11/24. Mucus is green in color. No fevers that she knows of. Taking Coricidin HBP. Lots of drainage, no ear pain.    She has been sick for a little over a week.  Started out with a cough, then developed runny nose, then the cough got worse, and she started coughing up green phlegm.  Denies any fever.   +sick contact--daughter is also sick, and they were together over Thanksgiving (patient's illness started just afterwards)  Coricidin HBP helps with the cough, and dries the nose some.  She has also taken a cough syrup intermittently. Still has some runny nose. The nasal drainage is mostly clear, sometimes yellow.  Cough is productive of greenish phlegm throughout the day. Denies shortness of breath, chest pain.  Denies any significant change in sugar since being sick, mostly low to mid 100's.  PMH, PSH, SH reviewed  Outpatient Encounter Prescriptions as of 10/29/2016  Medication Sig Note  . aspirin 81 MG tablet Take 81 mg by mouth every other day.  08/22/2016: Stopped taking it in mid-August after surgery on leg (had bleeding)  . BIOTIN PO Take 1 tablet by mouth daily.   Marland Kitchen BYDUREON 2 MG PEN INJECT 2 MG UNDER THE SKIN EVERY 7 DAYS   . Calcium Carbonate-Vitamin D (CALCIUM + D) 600-200 MG-UNIT TABS Take 1 tablet by mouth daily.    . Chlorpheniramine-DM (CORICIDIN HBP COUGH/COLD PO) Take 1 capsule by mouth every 4 (four) hours.   . Cholecalciferol (VITAMIN D) 1000 UNITS capsule Take 2,000 Units by mouth daily.     . Coenzyme Q10 (CO Q 10) 100 MG CAPS Take 1 capsule by mouth daily.   . fish oil-omega-3 fatty acids 1000 MG capsule Take 3 g by mouth daily.    . Insulin Pen Needle (BD PEN NEEDLE NANO U/F) 32G X 4 MM MISC USE AS DIRECTED WITH BYETTA(TWICE DAILY)   . losartan (COZAAR) 100 MG tablet TAKE 1 TABLET(100 MG) BY MOUTH DAILY   . magnesium gluconate (MAGONATE) 500 MG tablet Take 500 mg by mouth 2 (two) times daily.      . metFORMIN (GLUCOPHAGE) 1000 MG tablet Take 1 tablet by mouth two  times daily with meals   . Multiple Vitamins-Minerals (MULTIVITAMIN WITH MINERALS) tablet Take 1 tablet by mouth daily.     . pioglitazone (ACTOS) 15 MG tablet Take 1 tablet (15 mg total) by mouth daily.   . simvastatin (ZOCOR) 20 MG tablet TAKE 1 TABLET BY MOUTH  DAILY   . SYNTHROID 50 MCG tablet Take 1 tablet (50 mcg total) by mouth daily before breakfast.   . vitamin C (ASCORBIC ACID) 500 MG tablet Take 500 mg by mouth daily.      No facility-administered encounter medications on file as of 10/29/2016.    No Known Allergies   ROS: denies nausea, vomiting, diarrhea, bleeding, bruising, rashes. She has noticed some nose bleeding when blowing (not a nosebleed). +fatigue. Denies myalgais, arthralgias. Occasional headache, across the forehead. Denies dizziness, syncope. No chest pain, shortness of breath.  PHYSICAL EXAM:  BP 128/72 (BP Location: Left Arm, Patient Position: Sitting, Cuff Size: Normal)   Pulse 80   Temp 98.7 F (37.1 C) (Tympanic)   Ht 5' (1.524 m)   Wt 169 lb (76.7 kg)   BMI 33.01 kg/m   Well appearing female, sounds nasal, some throat clearing and dry, hacky cough. HEENT: PERRL, EOMI, conjunctiva and sclera are  clear TM's and EAC's normal. Nasal mucosa is mildly edematous, no erythema or purulence. Sinuses nontender. OP is clear Neck: no lymphadenopathy or mass Heart: regular rate and rhythm Lungs: clear bilaterally. No wheezes, rales, ronchi Skin: normal turgor, no rash Psych: normal mood, affect, hygiene and grooming Neuro: alert and oriented, cranial nerves intact, normal gait, strength   ASSESSMENT/PLAN:  Acute upper respiratory infection  Acute bronchitis, unspecified organism - Plan: azithromycin (ZITHROMAX) 250 MG tablet   Worsening URI symptoms with purulent phlegm in a diabetic. Cover for bacterial infection.  Supportive measures reviewed.    Take the antibiotic as directed.   Remember that it works for 10 days, even though you take it for 5.  Call us after 10 days if not completely better--call sooner if worse despite the medication (fever, persistent discolored phlegm and worsening cough).  Continue to drink plenty of water. You may continue coricidin. Guaifenesin is the expectorant found in mucinex, robitussin and others--this is good to take to loosen up the phlegm. It is also okay to take dextromethorphan (DM portion of many cough medications--double check your coridicin to make sure that you aren't getting duplicate ingredients from multiple medications).

## 2016-11-19 ENCOUNTER — Other Ambulatory Visit: Payer: Self-pay | Admitting: Family Medicine

## 2016-11-24 DIAGNOSIS — J069 Acute upper respiratory infection, unspecified: Secondary | ICD-10-CM | POA: Diagnosis not present

## 2016-11-24 DIAGNOSIS — R509 Fever, unspecified: Secondary | ICD-10-CM | POA: Diagnosis not present

## 2016-11-28 ENCOUNTER — Encounter: Payer: Self-pay | Admitting: *Deleted

## 2016-11-30 ENCOUNTER — Ambulatory Visit
Admission: RE | Admit: 2016-11-30 | Discharge: 2016-11-30 | Disposition: A | Discharge status: Home / Self Care | Payer: Medicare Other | Source: Ambulatory Visit | Attending: Family Medicine | Admitting: Family Medicine

## 2016-11-30 ENCOUNTER — Ambulatory Visit (INDEPENDENT_AMBULATORY_CARE_PROVIDER_SITE_OTHER): Payer: Medicare Other | Admitting: Family Medicine

## 2016-11-30 ENCOUNTER — Encounter: Payer: Self-pay | Admitting: Family Medicine

## 2016-11-30 VITALS — BP 126/76 | HR 80 | Temp 97.7°F | Ht 60.0 in | Wt 166.0 lb

## 2016-11-30 DIAGNOSIS — R05 Cough: Secondary | ICD-10-CM

## 2016-11-30 DIAGNOSIS — R059 Cough, unspecified: Secondary | ICD-10-CM

## 2016-11-30 DIAGNOSIS — E1149 Type 2 diabetes mellitus with other diabetic neurological complication: Secondary | ICD-10-CM | POA: Diagnosis not present

## 2016-11-30 DIAGNOSIS — J209 Acute bronchitis, unspecified: Secondary | ICD-10-CM | POA: Diagnosis not present

## 2016-11-30 MED ORDER — AZITHROMYCIN 250 MG PO TABS: ORAL_TABLET | ORAL | 0 refills | Status: DC

## 2016-11-30 MED ORDER — BENZONATATE 200 MG PO CAPS: 200.0000 mg | ORAL_CAPSULE | Freq: Three times a day (TID) | ORAL | 0 refills | Status: DC | PRN

## 2016-11-30 NOTE — Patient Instructions (Signed)
  Go to [facility] for a chest x-ray ([address] [place]). We will contact you later today with the results and let you know which antibiotic we are sending to the pharmacy.  I recommend taking Mucinex (plain kind--just guaifenesin) regularly to keep the phlegm thin. You may continue your cough medication--try using the syrup just at bedtime, and use the new prescription during the day--it is less sedating.  We will be replacing your Augmentin with a different antibiotic (so you can STOP taking it)--I just won't make that decision until I see the x-ray results. We are stopping the Augmentin mainly due to the side effect of cough.  It might actually be helping some--your fevers have resolved, but not enough.

## 2016-11-30 NOTE — Progress Notes (Signed)
Chief Complaint  Patient presents with  . Follow-up    from UC visit. Not feeling better, still coughing and feels like medications have not touched this at all. Having diarrhea from abx.    Went to [facility] 12/30 with URI symptoms.  She had T 100.6 (Tmax 101.9 prior to visit).  Influenza tests were negative. She was prescribed Augmentin and hydromet cough syrup.  She has been having diarrhea from the Augmentin, mouth feels very dry, and cough has not gotten any better. The fevers have resolved.  Cough is nonproductive--only twice got any phlegm up (recently, discolored; not in the last 2 days).   She has been taking the cough syrup 3-4 times/day, and seems to help. Makes her sleepy.    Denies shortness of breath, chest pain (slightly sore from the coughing).  Abdominal soreness has improved (since using the cough medicine).  Denies nausea, vomiting.  Only minor headaches.  Still has some head congestion--cloudy but not yellow or green.  Denies sinus pain.  Denies sore throat, ear pain (resolved).   Diarrhea was very frequent at first, slowed down to about 2-3 times yesterday. She has been taking probiotic along with it.  PMH ,PSH, SH reviewed  Outpatient Encounter Prescriptions as of 11/30/2016  Medication Sig Note  . amoxicillin-clavulanate (AUGMENTIN) 875-125 MG tablet Take 1 tablet by mouth 2 (two) times daily.   Marland Kitchen aspirin 81 MG tablet Take 81 mg by mouth every other day.  08/22/2016: Stopped taking it in mid-August after surgery on leg (had bleeding)  . BIOTIN PO Take 1 tablet by mouth daily.   Marland Kitchen BYDUREON 2 MG PEN INJECT 2 MG UNDER THE SKIN EVERY 7 DAYS   . Calcium Carbonate-Vitamin D (CALCIUM + D) 600-200 MG-UNIT TABS Take 1 tablet by mouth daily.    . Cholecalciferol (VITAMIN D) 1000 UNITS capsule Take 2,000 Units by mouth daily.     . Coenzyme Q10 (CO Q 10) 100 MG CAPS Take 1 capsule by mouth daily.   . fish oil-omega-3 fatty acids 1000 MG capsule Take 3 g by mouth daily.    Marland Kitchen  HYDROcodone-homatropine (HYCODAN) 5-1.5 MG/5ML syrup Take 5-10 mLs by mouth every 6 (six) hours as needed for cough.   . Insulin Pen Needle (BD PEN NEEDLE NANO U/F) 32G X 4 MM MISC USE AS DIRECTED WITH BYETTA(TWICE DAILY)   . losartan (COZAAR) 100 MG tablet TAKE 1 TABLET(100 MG) BY MOUTH DAILY   . magnesium gluconate (MAGONATE) 500 MG tablet Take 500 mg by mouth 2 (two) times daily.     . metFORMIN (GLUCOPHAGE) 1000 MG tablet Take 1 tablet by mouth two  times daily with meals   . Multiple Vitamins-Minerals (MULTIVITAMIN WITH MINERALS) tablet Take 1 tablet by mouth daily.     . pioglitazone (ACTOS) 15 MG tablet Take 1 tablet (15 mg total) by mouth daily.   . simvastatin (ZOCOR) 20 MG tablet TAKE 1 TABLET BY MOUTH  DAILY   . SYNTHROID 50 MCG tablet Take 1 tablet (50 mcg total) by mouth daily before breakfast.   . vitamin C (ASCORBIC ACID) 500 MG tablet Take 500 mg by mouth daily.     . benzonatate (TESSALON) 200 MG capsule Take 1 capsule (200 mg total) by mouth 3 (three) times daily as needed.   . Chlorpheniramine-DM (CORICIDIN HBP COUGH/COLD PO) Take 1 capsule by mouth every 4 (four) hours.   . [DISCONTINUED] azithromycin (ZITHROMAX) 250 MG tablet Take 2 tablets by mouth on first day, then 1  tablet by mouth on days 2 through 5    No facility-administered encounter medications on file as of 11/30/2016.    No Known Allergies  ROS:  Fevers resolved.  No significant headache, no dizziness, chest pain, shortness of breath.  +cough per HPI.  No nausea, vomiting.  +diarrhea. No bleeding, bruising, rash. Sugars have remained in the usual range, not high with illness.  Denies urinary complaints.  PHYSICAL EXAM:  BP 126/76 (BP Location: Left Arm, Patient Position: Sitting, Cuff Size: Normal)   Pulse 80   Temp 97.7 F (36.5 C) (Tympanic)   Ht 5' (1.524 m)   Wt 166 lb (75.3 kg)   BMI 32.42 kg/m   Well appearing, pleasant female, with frequent dry/barky cough.  Speaking easily in full sentences, in no  distress. Nasal mucosa appears normal, no erythema or purulence.  Sinuses are nontender.  OP is clear, moist mucus membranes. Neck: no lymphadenopathy or mass Heart: regular rate and rhythm, 2/6 SEM Lungs: good air movement throughout, no wheezes.  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      Suspect pneumonia based on exam.  Remainder of URI symptoms have improved.  Not tolerating augmentin due to diarrhea.   If CXR shows pneumonia, change to quinolone.  If normal, change to z-pak. Start guaifenesin; rx'd tessalon to use during day, use hydrocodone just at bedtime.   Go to [facility] for a chest x-ray ([address] [place]). We will contact you later today with the results and let you know which antibiotic we are sending to the pharmacy.  I recommend taking Mucinex (plain kind--just guaifenesin) regularly to keep the phlegm thin. You may continue your cough medication--try using the syrup just at bedtime, and use the new prescription during the day--it is less sedating.  We will be replacing your Augmentin with a different antibiotic (so you can STOP taking it)--I just won't make that decision until I see the x-ray results. We are stopping the Augmentin mainly due to the side effect of cough.  It might actually be helping some--your fevers have resolved, but not enough.   CXR results: FINDINGS: Thoracic spondylosis. Cardiac and mediastinal margins appear normal.  No pleural effusion. The lungs appear  clear.  IMPRESSION: 1. The lungs appear clear. Heart size within normal limits. 2. Thoracic spondylosis.  rx'd z-pak

## 2016-12-06 ENCOUNTER — Telehealth: Payer: Self-pay | Admitting: *Deleted

## 2016-12-06 MED ORDER — HYDROCODONE-HOMATROPINE 5-1.5 MG/5ML PO SYRP: 5.0000 mL | ORAL_SOLUTION | Freq: Four times a day (QID) | ORAL | 0 refills | Status: DC | PRN

## 2016-12-06 NOTE — Telephone Encounter (Signed)
Done

## 2016-12-06 NOTE — Telephone Encounter (Signed)
She was given zpak at her appointment.  While she only takes this for 5 days, remind her that it still works for a total of 10. Call us with an update on Monday with how she is feeling.  When she was here last time, the cough was not productive, mostly hacky.  Hopefully things are getting mobilized and should improve.  Continue the mucinex (especially with the thick phlegm) and use max doses per instruction on box. If she needs refill on hydrocodone containing cough syrup (she reported having a lot left at her last visit, prescribed by UC), then we can leave a written rx for her to pick up.  Remind her not to take if driving, can cause sedation, mostly meant for evening/bedtime.

## 2016-12-06 NOTE — Telephone Encounter (Signed)
Still coughing like crazy, no fevers or other symptoms. Mucus is white and thick. Still taking tessalon and mucinex 12 hr-looking for any suggestions as she really is no better.

## 2016-12-06 NOTE — Telephone Encounter (Signed)
Spoke with patient and she only has enough of the cough syrup to get her through tonight. She would like an rx written and she will come and pick up tomorrow morning please.

## 2016-12-06 NOTE — Telephone Encounter (Signed)
rx printed--please give to me to sign and give to front for pt to get tomorrow.

## 2016-12-10 ENCOUNTER — Telehealth: Payer: Self-pay | Admitting: *Deleted

## 2016-12-10 NOTE — Telephone Encounter (Signed)
Patient called and still coughing, no fever. Mucus is still cloudy but nor discolored. Not productive. Taking mucinex and rx cough syrup at night. Any other suggestions?

## 2016-12-11 NOTE — Telephone Encounter (Signed)
Advise pt f/u recommended if not better at this point (seen 1/5).

## 2016-12-11 NOTE — Telephone Encounter (Signed)
Attempted call to pt number [phone] unavailable at this time. Victorino December

## 2016-12-13 NOTE — Telephone Encounter (Signed)
Patient thinks she is feeling better-will call next week for appt if anything changes.

## 2016-12-16 ENCOUNTER — Other Ambulatory Visit: Payer: Self-pay | Admitting: Family Medicine

## 2017-01-14 ENCOUNTER — Other Ambulatory Visit: Payer: Self-pay | Admitting: *Deleted

## 2017-01-14 MED ORDER — SYNTHROID 50 MCG PO TABS: 50.0000 ug | ORAL_TABLET | Freq: Every day | ORAL | 0 refills | Status: DC

## 2017-02-05 ENCOUNTER — Other Ambulatory Visit: Payer: Self-pay | Admitting: Family Medicine

## 2017-02-19 DIAGNOSIS — K828 Other specified diseases of gallbladder: Secondary | ICD-10-CM | POA: Diagnosis not present

## 2017-02-19 DIAGNOSIS — C4371 Malignant melanoma of right lower limb, including hip: Secondary | ICD-10-CM | POA: Diagnosis not present

## 2017-02-19 DIAGNOSIS — R918 Other nonspecific abnormal finding of lung field: Secondary | ICD-10-CM | POA: Diagnosis not present

## 2017-02-19 DIAGNOSIS — C439 Malignant melanoma of skin, unspecified: Secondary | ICD-10-CM | POA: Diagnosis not present

## 2017-02-20 DIAGNOSIS — Z9221 Personal history of antineoplastic chemotherapy: Secondary | ICD-10-CM | POA: Diagnosis not present

## 2017-02-20 DIAGNOSIS — Z08 Encounter for follow-up examination after completed treatment for malignant neoplasm: Secondary | ICD-10-CM | POA: Diagnosis not present

## 2017-02-20 DIAGNOSIS — Z85820 Personal history of malignant melanoma of skin: Secondary | ICD-10-CM | POA: Diagnosis not present

## 2017-02-20 DIAGNOSIS — Z9889 Other specified postprocedural states: Secondary | ICD-10-CM | POA: Diagnosis not present

## 2017-03-11 DIAGNOSIS — L821 Other seborrheic keratosis: Secondary | ICD-10-CM | POA: Diagnosis not present

## 2017-03-11 DIAGNOSIS — L814 Other melanin hyperpigmentation: Secondary | ICD-10-CM | POA: Diagnosis not present

## 2017-03-11 DIAGNOSIS — D225 Melanocytic nevi of trunk: Secondary | ICD-10-CM | POA: Diagnosis not present

## 2017-03-13 DIAGNOSIS — Z1231 Encounter for screening mammogram for malignant neoplasm of breast: Secondary | ICD-10-CM | POA: Diagnosis not present

## 2017-03-13 DIAGNOSIS — M8589 Other specified disorders of bone density and structure, multiple sites: Secondary | ICD-10-CM | POA: Diagnosis not present

## 2017-03-13 LAB — HM DEXA SCAN

## 2017-03-13 LAB — HM MAMMOGRAPHY

## 2017-03-18 ENCOUNTER — Other Ambulatory Visit: Payer: Medicare Other

## 2017-03-18 DIAGNOSIS — E039 Hypothyroidism, unspecified: Secondary | ICD-10-CM

## 2017-03-18 DIAGNOSIS — I10 Essential (primary) hypertension: Secondary | ICD-10-CM

## 2017-03-18 DIAGNOSIS — E7800 Pure hypercholesterolemia, unspecified: Secondary | ICD-10-CM | POA: Diagnosis not present

## 2017-03-18 DIAGNOSIS — C439 Malignant melanoma of skin, unspecified: Secondary | ICD-10-CM

## 2017-03-18 DIAGNOSIS — E1149 Type 2 diabetes mellitus with other diabetic neurological complication: Secondary | ICD-10-CM

## 2017-03-18 DIAGNOSIS — Z5181 Encounter for therapeutic drug level monitoring: Secondary | ICD-10-CM | POA: Diagnosis not present

## 2017-03-18 LAB — CBC WITH DIFFERENTIAL/PLATELET
BASOS ABS: 66 {cells}/uL (ref 0–200)
BASOS PCT: 1 %
EOS ABS: 396 {cells}/uL (ref 15–500)
Eosinophils Relative: 6 %
HCT: 42.9 % (ref 35.0–45.0)
HEMOGLOBIN: 14 g/dL (ref 11.7–15.5)
LYMPHS ABS: 2244 {cells}/uL (ref 850–3900)
Lymphocytes Relative: 34 %
MCH: 29.5 pg (ref 27.0–33.0)
MCHC: 32.6 g/dL (ref 32.0–36.0)
MCV: 90.3 fL (ref 80.0–100.0)
MONO ABS: 660 {cells}/uL (ref 200–950)
MPV: 10.1 fL (ref 7.5–12.5)
Monocytes Relative: 10 %
NEUTROS ABS: 3234 {cells}/uL (ref 1500–7800)
Neutrophils Relative %: 49 %
Platelets: 282 10*3/uL (ref 140–400)
RBC: 4.75 MIL/uL (ref 3.80–5.10)
RDW: 14.1 % (ref 11.0–15.0)
WBC: 6.6 10*3/uL (ref 4.0–10.5)

## 2017-03-18 LAB — COMPREHENSIVE METABOLIC PANEL
ALK PHOS: 49 U/L (ref 33–130)
ALT: 15 U/L (ref 6–29)
AST: 21 U/L (ref 10–35)
Albumin: 4.3 g/dL (ref 3.6–5.1)
BILIRUBIN TOTAL: 0.6 mg/dL (ref 0.2–1.2)
BUN: 15 mg/dL (ref 7–25)
CALCIUM: 10.5 mg/dL — AB (ref 8.6–10.4)
CO2: 30 mmol/L (ref 20–31)
Chloride: 100 mmol/L (ref 98–110)
Creat: 0.95 mg/dL — ABNORMAL HIGH (ref 0.60–0.93)
GLUCOSE: 108 mg/dL — AB (ref 65–99)
POTASSIUM: 4.2 mmol/L (ref 3.5–5.3)
Sodium: 140 mmol/L (ref 135–146)
TOTAL PROTEIN: 7.1 g/dL (ref 6.1–8.1)

## 2017-03-18 LAB — TSH: TSH: 3.57 mIU/L

## 2017-03-19 LAB — HEMOGLOBIN A1C
Hgb A1c MFr Bld: 6.1 % — ABNORMAL HIGH (ref <5.7)
Mean Plasma Glucose: 128 mg/dL

## 2017-03-20 ENCOUNTER — Encounter: Payer: Self-pay | Admitting: Family Medicine

## 2017-03-20 NOTE — Progress Notes (Signed)
Chief Complaint  Patient presents with  . Medicare Wellness    needs losartan sent to [facility]. pt is fasting- already had labs Monday. no concerns.     Tonya Rogers is a 81 y.o. female who presents for annual wellness visit and follow-up on chronic medical conditions.    DM: Blood sugars have been running 95-126, up to 135. Doing well on the B-cise pen. She notices some slight knots in the thighs, no bruising. No hypoglycemia, polydipsia or polyuria. Has some neuropathy--tingling in feet (not in hands), a mild burning, not painful or bothersome, unchanged/chronic. Last eye exam was 04/2016, no retinopathy. She checks her feet regularly, no sores/concerns.  Hyperlipidemia--She is compliant with her medications, taking '20mg'$  of simvastatin and '3000mg'$  of fish oil. Denies any side effects to the medications, and is following a lowfat, low cholesterol diet.  Hypertension follow-up: Blood pressures elsewhere are 116-135/70's. Denies dizziness, headaches, chest pain. Denies side effects of medications.  Hypothyroidism: She denies any missed pills, still taking the branded Synthroid. She has always been crushing this, mixing it with water, as she had trouble swallowing the pill when she tried swallowing it whole. She has been taking it this way for many years. Dose was last adjusted from 25 to 65mg in December 2016. At that time she complained of skin being very dry, hair is falling out more than usual as well as her eyebrows, along with increase in fatigue. Currently denies any of those symptoms. Denies any change in weight, moods, bowels, temperature intolerance. She has had some issues with nail splitting over the years. Biotin has helped, but periodically has a problem (occurs just once a year usually, in the late Spring/early summer, not bothersome currently). This has been addressed with Dr. LAllyson Sabalin the past as well.   Melanoma--She last had melanoma removed from her RLL on  07/10/2016. She recently saw Dr. LClovis Rileyin f/u (last month)--they reported that both incisions on RLE are well healed with no evidence of local recurrence on exam. CT negative for changes.  They want her to return in 6 months with CT scan. She continues to see Dr. LAllyson Sabalyearly as well.  Osteoporosis:  She took fosamax x 5 years, stopped in [id number] to jaw complications.  She continues to take regular calcium, Vitamin D, and gets some weight-bearing exercise.  She just had bone density test 4/18. T-2.3 R fem neck. slight improvement, no worsening. Abnl FRAX 22% major osteoporotic fracture, 6.3% hip   Immunization History  Administered Date(s) Administered  . Influenza Split 08/26/2013, 09/08/2014, 08/17/2015  . Influenza-Unspecified 08/20/2016  . Pneumococcal Conjugate-13 08/26/2014  . Pneumococcal Polysaccharide-23 12/27/2000, 07/27/2008  . Td 03/26/2005  . Tdap 03/05/2012  . Zoster 02/25/2011   Last Pap smear: 10/2013, negative, no high risk HPV present Last mammogram: 02/2017 Last colonoscopy: 8/06; negative Cologard 01/2016 Last DEXA: 02/2017 T-2.3 R fem neck. slight improvement, no worsening. Abnl FRAX 22% major osteoporotic fracture, 6.3% hip Dentist: every 6 months Ophtho: yearly in June Exercise: walks 3x/week, 2 miles, some weights at home  Other doctors caring for patient include: [facility]ist[facility] Op[facility]: Dr. SGershon CraneENT: Dr. TBenjamine MolaDr. LDub Amis[facility], as well [facility] Dr. LAllyson SabalGI:Sadie Haber  Depression screen: negative Fall screen: none in the last year Functional Status Screen: notable for occasional stress urinary incontinence, decreased hearing, tinnitus, wears hearing aids.  End of Life Discussion:  Patient has a living will  and medical power of attorney  Past Medical History:  Diagnosis Date  . Alopecia    anterior hair loss  . Anxiety and depression     related to work--resolved  . Baker's cyst of knee    R popliteal  . Cholelithiasis 12/09   asymptomatic, noticed on CT 12/09  . 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 Diabetes mellitus 2002  . Diabetic neuropathy ([place])   . Diverticulosis of colon   . Hearing loss    hearing aids bilaterally  . Hypertension   . Melanoma ([place]) 7/09   RLE with in-transit mets; s/p hyperthermic limb perfusion chemo and excision; re-excision of recurrence 2011  . Metatarsal fracture 10/2009   L 5th--Dr. Rip Harbour  . Osteoporosis   . Pulmonary nodules    small, seen on CT 12/09  . Pure hypercholesterolemia     Past Surgical History:  Procedure Laterality Date  . CATARACT EXTRACTION, BILATERAL  01/2011  . excision of melanoma  11/23/08   wide excision of melanoma and R inguinal sentinel LN mapping and  biopsy  . excision of recurrent melanoma  2011, 06/2014   RLE  . TONSILLECTOMY AND ADENOIDECTOMY    . TYMPANOSTOMY TUBE PLACEMENT  2013   L ear  (Dr. Benjamine Mola)    Social History   Social History  . Marital status: Divorced    Spouse name: N/A  . Number of children: 2  . Years of education: N/A   Occupational History  . 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 Smoking status: Never Smoker  . Smokeless tobacco: Never Used  . Alcohol use 0.0 oz/week     Comment: 4 oz wine once or twice a week  . Drug use: No  . Sexual activity: Not Currently   Other Topics Concern  . Not on file   Social History Narrative   Daughter in [place] and son in [place]. 4 grandchildren.  Lives alone    Family History  Problem Relation Age of Onset  . Hypertension Mother   . Stroke Mother   . Arthritis Father   . Hemophilia Father   . Cancer Other     breast  . Diabetes Neg Hx   . Heart disease Neg Hx     Outpatient Encounter Prescriptions as of 03/21/2017  Medication Sig Note  . aspirin 81 MG tablet Take 81 mg by mouth every  other day.    Marland Kitchen BIOTIN PO Take 1 tablet by mouth daily.   Marland Kitchen BYDUREON 2 MG PEN INJECT 2 MG UNDER THE SKIN EVERY 7 DAYS 03/21/2017: B-cise pen  . Calcium Carbonate-Vitamin D (CALCIUM + D) 600-200 MG-UNIT TABS Take 1 tablet by mouth daily.    . Cholecalciferol (VITAMIN D) 1000 UNITS capsule Take 2,000 Units by mouth daily.     . Coenzyme Q10 (CO Q 10) 100 MG CAPS Take 1 capsule by mouth daily.   . fish oil-omega-3 fatty acids 1000 MG capsule Take 3 g by mouth daily.    Marland Kitchen losartan (COZAAR) 100 MG tablet TAKE 1 TABLET BY MOUTH EVERY DAY   . magnesium gluconate (MAGONATE) 500 MG tablet Take 500 mg by mouth 2 (two) times daily.     . metFORMIN (GLUCOPHAGE) 1000 MG tablet Take 1 tablet by mouth two  times daily with meals   . Multiple Vitamins-Minerals (MULTIVITAMIN WITH MINERALS) tablet Take 1 tablet by mouth daily.     Marland Kitchen  pioglitazone (ACTOS) 15 MG tablet Take 1 tablet (15 mg total) by mouth daily.   . simvastatin (ZOCOR) 20 MG tablet TAKE 1 TABLET BY MOUTH  DAILY   . SYNTHROID 50 MCG tablet Take 1 tablet (50 mcg total) by mouth daily before breakfast.   . vitamin C (ASCORBIC ACID) 500 MG tablet Take 500 mg by mouth daily.     . Insulin Pen Needle (BD PEN NEEDLE NANO U/F) 32G X 4 MM MISC USE AS DIRECTED WITH BYETTA(TWICE DAILY) (Patient not taking: Reported on 03/21/2017)   . [DISCONTINUED] azithromycin (ZITHROMAX) 250 MG tablet Take 2 tablets by mouth on first day, then 1 tablet by mouth on days 2 through 5   . [DISCONTINUED] benzonatate (TESSALON) 200 MG capsule Take 1 capsule (200 mg total) by mouth 3 (three) times daily as needed.   . [DISCONTINUED] Chlorpheniramine-DM (CORICIDIN HBP COUGH/COLD PO) Take 1 capsule by mouth every 4 (four) hours.   . [DISCONTINUED] HYDROcodone-homatropine (HYCODAN) 5-1.5 MG/5ML syrup Take 5 mLs by mouth every 6 (six) hours as needed for cough.    No facility-administered encounter medications on file as of 03/21/2017.     No Known Allergies   ROS: The patient  denies anorexia, fever, weight changes, headaches, vision changes, ear pain, sore throat, breast concerns, chest pain, palpitations, dizziness, syncope, dyspnea on exertion, cough, swelling, nausea, vomiting, diarrhea, abdominal pain, melena, hematochezia, indigestion/heartburn, hematuria, incontinence (just with cough/sneeze), dysuria,vaginal bleeding, discharge, odor or itch, genital lesions, joint pains, weakness, tremor, suspicious skin lesions, depression, anxiety, abnormal bleeding/bruising (mild, chronic), or enlarged lymph nodes. Constipation, unchanged/chronic--controlled Hearing loss L>R ear, unchanged. Has hearing aids--not wearing today, doesn't find them all that helpful. +tingling/mild burning in feet, chronic, unchanged. Dry mouth, which affected her teeth. Using fluoride trays at night now.  .  PHYSICAL EXAM:  BP 112/80   Pulse 68   Resp 18   Ht 5' (1.524 m)   Wt 169 lb 6.4 oz (76.8 kg)   BMI 33.08 kg/m   Wt Readings from Last 3 Encounters:  03/21/17 169 lb 6.4 oz (76.8 kg)  11/30/16 166 lb (75.3 kg)  10/29/16 169 lb (76.7 kg)    General Appearance:   Alert, cooperative, no distress, appears stated age  Head:   Normocephalic, without obvious abnormality, atraumatic. Thinning of hair anteriorly  Eyes:   PERRL, conjunctiva/corneas clear, EOM's intact, fundi benign  Ears:   Normal externally.TM's and EAC's normal  Nose:  Nares normal, mucosa normal, no drainage or sinustenderness  Throat:  Lips, mucosa, and tongue normal; teeth and gums normal  Neck:  Supple, no lymphadenopathy; thyroid: no enlargement/tenderness/nodules; no carotid bruit or JVD  Back:  Spine nontender, no curvature, ROM normal, no CVA tenderness  Lungs:   Clear to auscultation bilaterally without wheezes, rales or ronchi; respirations unlabored  Chest Wall:   No tenderness or deformity  Heart:   Regular rate and rhythm, S1 and S2 normal,  2/6 systolic ejection murmur is present; no rub or gallop  Breast Exam:   No tenderness, masses, or nipple discharge or inversion. No axillary lymphadenopathy  Abdomen:   Soft, non-tender, nondistended, normoactive bowel sounds, no masses, no hepatosplenomegaly.   Genitalia:   Normal external genitalia without lesions, atrophic changes noted. BUS and vagina normal; no cervical motion tenderness.No abnormal vaginal discharge. Uterus and adnexa not enlarged, nontender, no masses; exam somewhat limited by body habitus. Pap not performed  Rectal:   Normal tone, no masses or tenderness; heme negative stool  Extremities:  No clubbing, cyanosis or edema; some superficial skin nodules at injection sites left thigh, nontender, no erythema  Pulses:  2+ and symmetric all extremities  Skin:  Skin color, texture, turgor normal, no rashes or lesions. Normal sensation to monofilament.  Lymph nodes:  Cervical, supraclavicular, and axillary nodes normal  Neurologic:  CNII-XII intact, normal strength, sensation and gait; reflexes 2+ and symmetric throughout   Psych: Normal mood, affect, hygiene and grooming  Diabetic foot exam--normal.  Lab Results  Component Value Date   HGBA1C 6.1 (H) 03/18/2017     Chemistry      Component Value Date/Time   NA 140 03/18/2017 0818   K 4.2 03/18/2017 0818   CL 100 03/18/2017 0818   CO2 30 03/18/2017 0818   BUN 15 03/18/2017 0818   CREATININE 0.95 (H) 03/18/2017 0818      Component Value Date/Time   CALCIUM 10.5 (H) 03/18/2017 0818   CALCIUM 9.7 04/23/2012 0915   ALKPHOS 49 03/18/2017 0818   AST 21 03/18/2017 0818   ALT 15 03/18/2017 0818   BILITOT 0.6 03/18/2017 0818     Fasting glucose 108  Lab Results  Component Value Date   TSH 3.57 03/18/2017    ASSESSMENT/PLAN:  Medicare annual wellness visit, subsequent  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[place]) -  controlled; mild neuropathy, tolerable, not in need of treatment - Plan: pioglitazone (ACTOS) 15 MG tablet, metFORMIN (GLUCOPHAGE) 1000 MG tablet  Microalbuminuria  Osteopenia, unspecified location - no decline; continue Ca, D, Weight-bearing exercise; recheck DEXA 2 years  Pure hypercholesterolemia - continue simvastatin, low cholesterol diet  Essential hypertension, benign - well controlled - Plan: losartan (COZAAR) 100 MG tablet  Melanoma of skin ([facility]) - stable currently; continue close f/u as planned  Hypothyroidism, unspecified type - adequately replaced on current dose  Medication monitoring encounter   Discussed monthly self breast exams and yearly mammograms; at least 30 minutes of aerobic activity at least 5 days/week and weight-bearing exercise 2x/week; proper sunscreen use reviewed; healthy diet, including goals of calcium and vitamin D intake and alcohol recommendations (less than or equal to 1 drink/day) reviewed; regular seatbelt use; changing batteries in smoke detectors. Immunization recommendations discussed--continue yearly high dose flu shots.  Shingrix recommended--risks/side effects reviewed. Colonoscopy recommendations reviewed, was due 06/2015.Had negative cologard 3//2017, due again 01/2019.  MOST form reviewed and updated, Full Code, Full care.   F/u 6 months, fasting labs prior--A1c, lipid, TSH, c-met, urine microalb/Cr    Medicare Attestation I have personally reviewed: The patient's medical and social history Their use of alcohol, tobacco or illicit drugs Their current medications and supplements The patient's functional ability including ADLs,fall risks, home safety risks, cognitive, and hearing and visual impairment Diet and physical activities Evidence for depression or mood disorders  The patient's weight, height, BMI, and visual acuity have been recorded in the chart.  I have made referrals, counseling, and provided education to the patient based  on review of the above and I have provided the patient with a written personalized care plan for preventive services.

## 2017-03-21 ENCOUNTER — Encounter: Payer: Self-pay | Admitting: Family Medicine

## 2017-03-21 ENCOUNTER — Ambulatory Visit (INDEPENDENT_AMBULATORY_CARE_PROVIDER_SITE_OTHER): Payer: Medicare Other | Admitting: Family Medicine

## 2017-03-21 VITALS — BP 112/80 | HR 68 | Resp 18 | Ht 60.0 in | Wt 169.4 lb

## 2017-03-21 DIAGNOSIS — M8580 Other specified disorders of bone density and structure, unspecified site: Secondary | ICD-10-CM | POA: Diagnosis not present

## 2017-03-21 DIAGNOSIS — E039 Hypothyroidism, unspecified: Secondary | ICD-10-CM | POA: Diagnosis not present

## 2017-03-21 DIAGNOSIS — I10 Essential (primary) hypertension: Secondary | ICD-10-CM

## 2017-03-21 DIAGNOSIS — C439 Malignant melanoma of skin, unspecified: Secondary | ICD-10-CM

## 2017-03-21 DIAGNOSIS — E7800 Pure hypercholesterolemia, unspecified: Secondary | ICD-10-CM | POA: Diagnosis not present

## 2017-03-21 DIAGNOSIS — Z5181 Encounter for therapeutic drug level monitoring: Secondary | ICD-10-CM | POA: Diagnosis not present

## 2017-03-21 DIAGNOSIS — E1149 Type 2 diabetes mellitus with other diabetic neurological complication: Secondary | ICD-10-CM

## 2017-03-21 DIAGNOSIS — Z0000 Encounter for general adult medical examination without abnormal findings: Secondary | ICD-10-CM

## 2017-03-21 DIAGNOSIS — R809 Proteinuria, unspecified: Secondary | ICD-10-CM | POA: Diagnosis not present

## 2017-03-21 MED ORDER — LOSARTAN POTASSIUM 100 MG PO TABS: 100.0000 mg | ORAL_TABLET | Freq: Every day | ORAL | 1 refills | Status: DC

## 2017-03-21 MED ORDER — METFORMIN HCL 1000 MG PO TABS
ORAL_TABLET | ORAL | 1 refills | Status: DC
Start: 2017-03-21 — End: 2017-07-16

## 2017-03-21 MED ORDER — PIOGLITAZONE HCL 15 MG PO TABS: 15.0000 mg | ORAL_TABLET | Freq: Every day | ORAL | 1 refills | Status: DC

## 2017-03-21 NOTE — Patient Instructions (Signed)
  HEALTH MAINTENANCE RECOMMENDATIONS:  It is recommended that you get at least 30 minutes of aerobic exercise at least 5 days/week (for weight loss, you may need as much as 60-90 minutes). This can be any activity that gets your heart rate up. This can be divided in 10-15 minute intervals if needed, but try and build up your endurance at least once a week.  Weight bearing exercise is also recommended twice weekly.  Eat a healthy diet with lots of vegetables, fruits and fiber.  "Colorful" foods have a lot of vitamins (ie green vegetables, tomatoes, red peppers, etc).  Limit sweet tea, regular sodas and alcoholic beverages, all of which has a lot of calories and sugar.  Up to 1 alcoholic drink daily may be beneficial for women (unless trying to lose weight, watch sugars).  Drink a lot of water.  Calcium recommendations are 1200-1500 mg daily (1500 mg for postmenopausal women or women without ovaries), and vitamin D 1000 IU daily.  This should be obtained from diet and/or supplements (vitamins), and calcium should not be taken all at once, but in divided doses.  Monthly self breast exams and yearly mammograms for women over the age of 2 is recommended.  Sunscreen of at least SPF 30 should be used on all sun-exposed parts of the skin when outside between the hours of 10 am and 4 pm (not just when at beach or pool, but even with exercise, golf, tennis, and yard work!)  Use a sunscreen that says "broad spectrum" so it covers both UVA and UVB rays, and make sure to reapply every 1-2 hours.  Remember to change the batteries in your smoke detectors when changing your clock times in the spring and fall.  Use your seat belt every time you are in a car, and please drive safely and not be distracted with cell phones and texting while driving.    Tonya Rogers , Thank you for taking time to come for your Medicare Wellness Visit. I appreciate your ongoing commitment to your health goals. Please review the  following plan we discussed and let me know if I can assist you in the future.   These are the goals we discussed: Goals    None      This is a list of the screening recommended for you and due dates:  Health Maintenance  Topic Date Due  . Complete foot exam   01/17/2017  . Eye exam for diabetics  04/26/2017  . Flu Shot  06/26/2017  . Hemoglobin A1C  09/17/2017  . Tetanus Vaccine  03/05/2022  . DEXA scan (bone density measurement)  Completed  . Pneumonia vaccines  Completed   Foot exam was done today. Your next bone density should be repeated in 2 years (02/2019). Continue yearly mammograms, next due 02/2018.  Colon cancer screening--consider repeating the Cologard testing in 2020.  I recommend getting the new shingles vaccine (Shingrix). You will need to check with your insurance to see if it is covered, and if covered by Medicare Part D, you need to get from the pharmacy rather than our office.  It is a series of 2 injections, spaced 2 months apart.  Increase your exercise to a minimum of 150 minutes of aerobic exercise each week. Continue weight-bearing exercise at least 2-3 times/week.

## 2017-03-25 ENCOUNTER — Encounter: Payer: Self-pay | Admitting: Family Medicine

## 2017-04-15 ENCOUNTER — Other Ambulatory Visit: Payer: Self-pay | Admitting: *Deleted

## 2017-04-15 MED ORDER — EXENATIDE ER 2 MG/0.85ML ~~LOC~~ AUIJ: 2.0000 mg | AUTO-INJECTOR | SUBCUTANEOUS | 2 refills | Status: DC

## 2017-04-15 MED ORDER — SYNTHROID 50 MCG PO TABS: 50.0000 ug | ORAL_TABLET | Freq: Every day | ORAL | 2 refills | Status: DC

## 2017-05-02 DIAGNOSIS — E119 Type 2 diabetes mellitus without complications: Secondary | ICD-10-CM | POA: Diagnosis not present

## 2017-05-02 DIAGNOSIS — Z961 Presence of intraocular lens: Secondary | ICD-10-CM | POA: Diagnosis not present

## 2017-05-02 LAB — HM DIABETES EYE EXAM

## 2017-06-05 ENCOUNTER — Other Ambulatory Visit: Payer: Self-pay | Admitting: Family Medicine

## 2017-06-12 DIAGNOSIS — H9313 Tinnitus, bilateral: Secondary | ICD-10-CM | POA: Diagnosis not present

## 2017-06-12 DIAGNOSIS — H838X3 Other specified diseases of inner ear, bilateral: Secondary | ICD-10-CM | POA: Diagnosis not present

## 2017-06-12 DIAGNOSIS — H903 Sensorineural hearing loss, bilateral: Secondary | ICD-10-CM | POA: Diagnosis not present

## 2017-06-22 ENCOUNTER — Other Ambulatory Visit: Payer: Self-pay | Admitting: Family Medicine

## 2017-07-16 ENCOUNTER — Other Ambulatory Visit: Payer: Self-pay | Admitting: Family Medicine

## 2017-07-16 DIAGNOSIS — E1149 Type 2 diabetes mellitus with other diabetic neurological complication: Secondary | ICD-10-CM

## 2017-07-17 ENCOUNTER — Telehealth: Payer: Self-pay

## 2017-07-17 MED ORDER — SIMVASTATIN 20 MG PO TABS: 20.0000 mg | ORAL_TABLET | Freq: Every day | ORAL | 0 refills | Status: DC

## 2017-07-17 NOTE — Telephone Encounter (Signed)
Done

## 2017-07-17 NOTE — Telephone Encounter (Signed)
Please eval for routine refill and send to me only if needed

## 2017-07-17 NOTE — Telephone Encounter (Signed)
Fax request rcvd from [facility] for Zocor. Tonya Rogers

## 2017-09-04 ENCOUNTER — Other Ambulatory Visit: Payer: Self-pay | Admitting: Family Medicine

## 2017-09-04 DIAGNOSIS — I10 Essential (primary) hypertension: Secondary | ICD-10-CM

## 2017-09-13 DIAGNOSIS — Z23 Encounter for immunization: Secondary | ICD-10-CM | POA: Diagnosis not present

## 2017-09-16 ENCOUNTER — Other Ambulatory Visit: Payer: Medicare Other

## 2017-09-16 DIAGNOSIS — E7800 Pure hypercholesterolemia, unspecified: Secondary | ICD-10-CM

## 2017-09-16 DIAGNOSIS — E1149 Type 2 diabetes mellitus with other diabetic neurological complication: Secondary | ICD-10-CM

## 2017-09-16 DIAGNOSIS — R809 Proteinuria, unspecified: Secondary | ICD-10-CM | POA: Diagnosis not present

## 2017-09-16 DIAGNOSIS — E039 Hypothyroidism, unspecified: Secondary | ICD-10-CM

## 2017-09-16 DIAGNOSIS — I10 Essential (primary) hypertension: Secondary | ICD-10-CM | POA: Diagnosis not present

## 2017-09-16 DIAGNOSIS — Z5181 Encounter for therapeutic drug level monitoring: Secondary | ICD-10-CM | POA: Diagnosis not present

## 2017-09-17 LAB — COMPREHENSIVE METABOLIC PANEL
AG Ratio: 1.8 (calc) (ref 1.0–2.5)
ALBUMIN MSPROF: 4.4 g/dL (ref 3.6–5.1)
ALT: 20 U/L (ref 6–29)
AST: 28 U/L (ref 10–35)
Alkaline phosphatase (APISO): 48 U/L (ref 33–130)
BUN / CREAT RATIO: 17 (calc) (ref 6–22)
BUN: 17 mg/dL (ref 7–25)
CALCIUM: 10.1 mg/dL (ref 8.6–10.4)
CHLORIDE: 100 mmol/L (ref 98–110)
CO2: 31 mmol/L (ref 20–32)
CREATININE: 0.99 mg/dL — AB (ref 0.60–0.93)
GLUCOSE: 131 mg/dL — AB (ref 65–99)
Globulin: 2.5 g/dL (calc) (ref 1.9–3.7)
POTASSIUM: 4.3 mmol/L (ref 3.5–5.3)
SODIUM: 139 mmol/L (ref 135–146)
TOTAL PROTEIN: 6.9 g/dL (ref 6.1–8.1)
Total Bilirubin: 0.7 mg/dL (ref 0.2–1.2)

## 2017-09-17 LAB — TSH: TSH: 2.6 m[IU]/L (ref 0.40–4.50)

## 2017-09-17 LAB — LIPID PANEL
CHOL/HDL RATIO: 1.8 (calc) (ref <5.0)
Cholesterol: 149 mg/dL (ref <200)
HDL: 85 mg/dL (ref >50)
LDL Cholesterol (Calc): 47 mg/dL (calc)
Non-HDL Cholesterol (Calc): 64 mg/dL (calc) (ref <130)
TRIGLYCERIDES: 90 mg/dL (ref <150)

## 2017-09-17 LAB — MICROALBUMIN / CREATININE URINE RATIO
CREATININE, URINE: 125 mg/dL (ref 20–275)
Microalb Creat Ratio: 146 mcg/mg creat — ABNORMAL HIGH (ref <30)
Microalb, Ur: 18.2 mg/dL

## 2017-09-17 LAB — HEMOGLOBIN A1C
EAG (MMOL/L): 7.3 (calc)
Hgb A1c MFr Bld: 6.2 % of total Hgb — ABNORMAL HIGH (ref <5.7)
MEAN PLASMA GLUCOSE: 131 (calc)

## 2017-09-18 NOTE — Progress Notes (Signed)
Chief Complaint  Patient presents with  . Diabetes    nonfasting med check. No concerns.    DM: She hasn't been checking her sugars recently.  Doing well on the B-cise pen. She notices occasional slight knots in the thighs, abdomen, occasional mild bruising, not painful. No hypoglycemia, polydipsia or polyuria (goes more frequently because staying well hydrated). Has some neuropathy--tingling in feet (not in hands), a mild burning, not painful or bothersome, unchanged/chronic. Last eye exam was 04/2017, no retinopathy. She checks her feet regularly, no sores/concerns.  2 week trip to [facility] (early summer), cruise, and admits her diet was not great, too many desserts.  Hyperlipidemia--She is compliant with her medications, taking 49m of simvastatin and 30075mof fish oil. Denies any side effects to the medications, and is following a lowfat, low cholesterol diet (other than her vacation)  Hypertension follow-up: Blood pressures elsewhere are 120's-135/70's-80. Denies dizziness, headaches, chest pain. Denies side effects of medications.  Hypothyroidism: She denies any missed pills, taking the branded Synthroid. She has always been crushing this, mixing it with water, as she had trouble swallowing the pill when she tried swallowing it whole. She has been taking it this way for many years. Denies any change in weight, moods, bowels, temperature intolerance. She has had some issues with nail splitting over the years, but Biotin has helped. Constipation is managed well with prunes.  Melanoma--She last had melanoma removed from her RLL on 07/10/2016.  [facility]. She thinks she is scheduled for next month. She has noticed a new area pop up on the right lower leg in the last month. She continues to see Dr. LuAllyson Sabalearly as well.  Osteoporosis:  She took fosamax x 5 years, stopped in [id number] to jaw complications.  She continues to  take regular calcium, Vitamin D, and gets some weight-bearing exercise.  She had bone density test 4/18. T-2.3 R fem neck. slight improvement, no worsening. Abnl FRAX 22% major osteoporotic fracture, 6.3% hip  PMH, PSH, SH reviewed  Outpatient Encounter Prescriptions as of 09/19/2017  Medication Sig  . aspirin 81 MG tablet Take 81 mg by mouth every other day.   . Marland KitchenIOTIN PO Take 1 tablet by mouth daily.  . Marland KitchenYDUREON BCISE 2 MG/0.85ML AUIJ INJECT 2 MG INTO THE SKIN ONCE A WEEK  . Calcium Carbonate-Vitamin D (CALCIUM + D) 600-200 MG-UNIT TABS Take 1 tablet by mouth daily.   . Cholecalciferol (VITAMIN D) 1000 UNITS capsule Take 2,000 Units by mouth daily.    . Coenzyme Q10 (CO Q 10) 100 MG CAPS Take 1 capsule by mouth daily.  . fish oil-omega-3 fatty acids 1000 MG capsule Take 3 g by mouth daily.   . Insulin Pen Needle (BD PEN NEEDLE NANO U/F) 32G X 4 MM MISC USE AS DIRECTED WITH BYETTA(TWICE DAILY)  . losartan (COZAAR) 100 MG tablet TAKE 1 TABLET(100 MG) BY MOUTH DAILY  . magnesium gluconate (MAGONATE) 500 MG tablet Take 500 mg by mouth 2 (two) times daily.    . metFORMIN (GLUCOPHAGE) 1000 MG tablet TAKE 1 TABLET BY MOUTH TWO  TIMES DAILY WITH MEALS  . Multiple Vitamins-Minerals (MULTIVITAMIN WITH MINERALS) tablet Take 1 tablet by mouth daily.    . pioglitazone (ACTOS) 15 MG tablet Take 1 tablet (15 mg total) by mouth daily.  . simvastatin (ZOCOR) 20 MG tablet Take 1 tablet (20 mg total) by mouth daily.  . Marland KitchenYNTHROID 50 MCG tablet  Take 1 tablet (50 mcg total) by mouth daily before breakfast.  . vitamin C (ASCORBIC ACID) 500 MG tablet Take 500 mg by mouth daily.     No facility-administered encounter medications on file as of 09/19/2017.    No Known Allergies  ROS: no fever, chills, headaches, dizziness, chest pain, shortness of breath, GI or GU complaints, bleeding or bruising.  +new mole on RLE as per HPI.  Moods are good.  No joint pains or other concerns.    PHYSICAL EXAM:  BP 134/74    Pulse 68   Ht 5' (1.524 m)   Wt 170 lb (77.1 kg)   BMI 33.20 kg/m   Wt Readings from Last 3 Encounters:  09/19/17 170 lb (77.1 kg)  03/21/17 169 lb 6.4 oz (76.8 kg)  11/30/16 166 lb (75.3 kg)   Well developed, pleasant female, in good spirits HEENT: conjunctiva and sclera are clear, OP clear Neck: no lymphadenopathy, thyromegaly or mass, no bruit Heart: regular rate and rhythm Lungs: clear bilaterally Back: no CVA or spinal tenderness Abdomen: soft, nontender, no organomegaly or mass Extremities: no edema, normal pulses, normal sensation. Skin: 7x4 mm irregularly shaped raised pigmented mole on her right lateral lower leg (upper portion of lower leg, about an inch inferior to the lateral aspect of the horizontal scar).  Color is uniform.  Labs done prior to visit, on 09/16/17  Urine microalb/Cr ratio 146 Fasting glucose 131   Chemistry      Component Value Date/Time   NA 139 09/16/2017 1004   K 4.3 09/16/2017 1004   CL 100 09/16/2017 1004   CO2 31 09/16/2017 1004   BUN 17 09/16/2017 1004   CREATININE 0.99 (H) 09/16/2017 1004      Component Value Date/Time   CALCIUM 10.1 09/16/2017 1004   CALCIUM 9.7 04/23/2012 0915   ALKPHOS 49 03/18/2017 0818   AST 28 09/16/2017 1004   ALT 20 09/16/2017 1004   BILITOT 0.7 09/16/2017 1004     Lab Results  Component Value Date   TSH 2.60 09/16/2017   Lab Results  Component Value Date   CHOL 149 09/16/2017   HDL 85 09/16/2017   LDLCALC 58 08/20/2016   TRIG 90 09/16/2017   CHOLHDL 1.8 09/16/2017   Lab Results  Component Value Date   HGBA1C 6.2 (H) 09/16/2017     ASSESSMENT/PLAN:  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  DM still controlled, BP is borderline--will work on improving this. on losartan  Essential hypertension, benign - controlled, borderline; encouraged low sodium diet, exercise, weight loss; if consistently >130-135, will need  med changes  Pure hypercholesterolemia - at goal on current regimen  Hypothyroidism, unspecified type - adequately replaced on current med  Melanoma of skin ([place]) - suspect recurrence.  Needs to f/u with one of her dermatologists as soon as she can   F/u 6 months for AWV, with fasting labs prior A1c, c-met, CBC (lipids and TSH not needed, fine now)    She denies needing medications currently (seems like she should need pioglitazone).  She is on a waiting list to get her Shingrix from the pharmacy  I am very concerned about the new mole on the right leg being another recurrent melanoma.  If Dr. Clovis Riley can't get you in quickly, please see Dr. Allyson Sabal.  Don't wait a month for an appointment.  It is recommended that you get at least 30 minutes of aerobic exercise  at least 5 days/week (for weight loss, you may need as much as 60-90 minutes). This can be any activity that gets your heart rate up. This can be divided in 10-15 minute intervals if needed, but try and build up your endurance at least once a week.  Weight bearing exercise is also recommended twice weekly.  In order to lose the weight as recommended, you likely need to increase your exercise, and, more importantly, watch your diet more closely--healthier food choices and portion control. Consider other weight loss options (ie Weight Watchers) if you aren't successful on your own.  I'd like for your blood pressure to be 757-322 systolic.  I'm concerned that it has been running a little on the high side, which may contribute to the higher protein in the urine.  Cut back on the sodium in your diet, and increase exercise and work on weight loss--all of these measures should help keep the blood pressure lower, so that we don't need to adjust your medications (another medication would need to be added).  Try and check your blood pressure once a week (or more), record on a piece of paper.  Bring the paper to your visits, but feel free to mail  or fax them sooner if the blood pressures are borderline, high or you have questions.

## 2017-09-19 ENCOUNTER — Encounter: Payer: Self-pay | Admitting: Family Medicine

## 2017-09-19 ENCOUNTER — Ambulatory Visit (INDEPENDENT_AMBULATORY_CARE_PROVIDER_SITE_OTHER): Payer: Medicare Other | Admitting: Family Medicine

## 2017-09-19 ENCOUNTER — Other Ambulatory Visit: Payer: Self-pay | Admitting: *Deleted

## 2017-09-19 VITALS — BP 134/74 | HR 68 | Ht 60.0 in | Wt 170.0 lb

## 2017-09-19 DIAGNOSIS — Z5181 Encounter for therapeutic drug level monitoring: Secondary | ICD-10-CM | POA: Diagnosis not present

## 2017-09-19 DIAGNOSIS — E7800 Pure hypercholesterolemia, unspecified: Secondary | ICD-10-CM | POA: Diagnosis not present

## 2017-09-19 DIAGNOSIS — C439 Malignant melanoma of skin, unspecified: Secondary | ICD-10-CM

## 2017-09-19 DIAGNOSIS — I10 Essential (primary) hypertension: Secondary | ICD-10-CM | POA: Diagnosis not present

## 2017-09-19 DIAGNOSIS — R809 Proteinuria, unspecified: Secondary | ICD-10-CM

## 2017-09-19 DIAGNOSIS — E039 Hypothyroidism, unspecified: Secondary | ICD-10-CM

## 2017-09-19 DIAGNOSIS — E1149 Type 2 diabetes mellitus with other diabetic neurological complication: Secondary | ICD-10-CM | POA: Diagnosis not present

## 2017-09-19 MED ORDER — GLUCOSE BLOOD VI STRP: 1.0000 | ORAL_STRIP | 5 refills | Status: DC | PRN

## 2017-09-19 MED ORDER — GLUCOSE BLOOD VI STRP: 1.0000 | ORAL_STRIP | Freq: Two times a day (BID) | 5 refills | Status: AC

## 2017-09-19 NOTE — Patient Instructions (Signed)
I am very concerned about the new mole on the right leg being another recurrent melanoma.  If Dr. Clovis Riley can't get you in quickly, please see Dr. Allyson Sabal.  Don't wait a month for an appointment.  It is recommended that you get at least 30 minutes of aerobic exercise at least 5 days/week (for weight loss, you may need as much as 60-90 minutes). This can be any activity that gets your heart rate up. This can be divided in 10-15 minute intervals if needed, but try and build up your endurance at least once a week.  Weight bearing exercise is also recommended twice weekly.  In order to lose the weight as recommended, you likely need to increase your exercise, and, more importantly, watch your diet more closely--healthier food choices and portion control. Consider other weight loss options (ie Weight Watchers) if you aren't successful on your own.  I'd like for your blood pressure to be 160-109 systolic.  I'm concerned that it has been running a little on the high side, which may contribute to the higher protein in the urine.  Cut back on the sodium in your diet, and increase exercise and work on weight loss--all of these measures should help keep the blood pressure lower, so that we don't need to adjust your medications (another medication would need to be added).  Try and check your blood pressure once a week (or more), record on a piece of paper.  Bring the paper to your visits, but feel free to mail or fax them sooner if the blood pressures are borderline, high or you have questions.      DASH Eating Plan DASH stands for "Dietary Approaches to Stop Hypertension." The DASH eating plan is a healthy eating plan that has been shown to reduce high blood pressure (hypertension). It may also reduce your risk for type 2 diabetes, heart disease, and stroke. The DASH eating plan may also help with weight loss. What are tips for following this plan? General guidelines  Avoid eating more than 2,300 mg  (milligrams) of salt (sodium) a day. If you have hypertension, you may need to reduce your sodium intake to 1,500 mg a day.  Limit alcohol intake to no more than 1 drink a day for nonpregnant women and 2 drinks a day for men. One drink equals 12 oz of beer, 5 oz of wine, or 1 oz of hard liquor.  Work with your health care provider to maintain a healthy body weight or to lose weight. Ask what an ideal weight is for you.  Get at least 30 minutes of exercise that causes your heart to beat faster (aerobic exercise) most days of the week. Activities may include walking, swimming, or biking.  Work with your health care provider or diet and nutrition specialist (dietitian) to adjust your eating plan to your individual calorie needs. Reading food labels  Check food labels for the amount of sodium per serving. Choose foods with less than 5 percent of the Daily Value of sodium. Generally, foods with less than 300 mg of sodium per serving fit into this eating plan.  To find whole grains, look for the word "whole" as the first word in the ingredient list. Shopping  Buy products labeled as "low-sodium" or "no salt added."  Buy fresh foods. Avoid canned foods and premade or frozen meals. Cooking  Avoid adding salt when cooking. Use salt-free seasonings or herbs instead of table salt or sea salt. Check with your health care provider or  pharmacist before using salt substitutes.  Do not fry foods. Cook foods using healthy methods such as baking, boiling, grilling, and broiling instead.  Cook with heart-healthy oils, such as olive, canola, soybean, or sunflower oil. Meal planning   Eat a balanced diet that includes: ? 5 or more servings of fruits and vegetables each day. At each meal, try to fill half of your plate with fruits and vegetables. ? Up to 6-8 servings of whole grains each day. ? Less than 6 oz of lean meat, poultry, or fish each day. A 3-oz serving of meat is about the same size as a deck  of cards. One egg equals 1 oz. ? 2 servings of low-fat dairy each day. ? A serving of nuts, seeds, or beans 5 times each week. ? Heart-healthy fats. Healthy fats called Omega-3 fatty acids are found in foods such as flaxseeds and coldwater fish, like sardines, salmon, and mackerel.  Limit how much you eat of the following: ? Canned or prepackaged foods. ? Food that is high in trans fat, such as fried foods. ? Food that is high in saturated fat, such as fatty meat. ? Sweets, desserts, sugary drinks, and other foods with added sugar. ? Full-fat dairy products.  Do not salt foods before eating.  Try to eat at least 2 vegetarian meals each week.  Eat more home-cooked food and less restaurant, buffet, and fast food.  When eating at a restaurant, ask that your food be prepared with less salt or no salt, if possible. What foods are recommended? The items listed may not be a complete list. Talk with your dietitian about what dietary choices are best for you. Grains Whole-grain or whole-wheat bread. Whole-grain or whole-wheat pasta. Brown rice. [place]. Bulgur. Whole-grain and low-sodium cereals. Pita bread. Low-fat, low-sodium crackers. Whole-wheat flour tortillas. Vegetables Fresh or frozen vegetables (raw, steamed, roasted, or grilled). Low-sodium or reduced-sodium tomato and vegetable juice. Low-sodium or reduced-sodium tomato sauce and tomato paste. Low-sodium or reduced-sodium canned vegetables. Fruits All fresh, dried, or frozen fruit. Canned fruit in natural juice (without added sugar). Meat and other protein foods Skinless chicken or [place]. Ground chicken or [place]. Pork with fat trimmed off. Fish and seafood. Egg whites. Dried beans, peas, or lentils. Unsalted nuts, nut butters, and seeds. Unsalted canned beans. Lean cuts of beef with fat trimmed off. Low-sodium, lean deli meat. Dairy Low-fat (1%) or fat-free (skim) milk. Fat-free, low-fat, or reduced-fat cheeses. Nonfat,  low-sodium ricotta or cottage cheese. Low-fat or nonfat yogurt. Low-fat, low-sodium cheese. Fats and oils Soft margarine without trans fats. Vegetable oil. Low-fat, reduced-fat, or light mayonnaise and salad dressings (reduced-sodium). Canola, safflower, olive, soybean, and sunflower oils. Avocado. Seasoning and other foods Herbs. Spices. Seasoning mixes without salt. Unsalted popcorn and pretzels. Fat-free sweets. What foods are not recommended? The items listed may not be a complete list. Talk with your dietitian about what dietary choices are best for you. Grains Baked goods made with fat, such as croissants, muffins, or some breads. Dry pasta or rice meal packs. Vegetables Creamed or fried vegetables. Vegetables in a cheese sauce. Regular canned vegetables (not low-sodium or reduced-sodium). Regular canned tomato sauce and paste (not low-sodium or reduced-sodium). Regular tomato and vegetable juice (not low-sodium or reduced-sodium). Angie Fava. Olives. Fruits Canned fruit in a light or heavy syrup. Fried fruit. Fruit in cream or butter sauce. Meat and other protein foods Fatty cuts of meat. Ribs. Fried meat. Berniece Salines. Sausage. Bologna and other processed lunch meats. Salami. Fatback. Hotdogs. Bratwurst.  Salted nuts and seeds. Canned beans with added salt. Canned or smoked fish. Whole eggs or egg yolks. Chicken or [place] with skin. Dairy Whole or 2% milk, cream, and half-and-half. Whole or full-fat cream cheese. Whole-fat or sweetened yogurt. Full-fat cheese. Nondairy creamers. Whipped toppings. Processed cheese and cheese spreads. Fats and oils Butter. Stick margarine. Lard. Shortening. Ghee. Bacon fat. Tropical oils, such as coconut, palm kernel, or palm oil. Seasoning and other foods Salted popcorn and pretzels. Onion salt, garlic salt, seasoned salt, table salt, and sea salt. Worcestershire sauce. Tartar sauce. Barbecue sauce. Teriyaki sauce. Soy sauce, including reduced-sodium. Steak sauce.  Canned and packaged gravies. Fish sauce. Oyster sauce. Cocktail sauce. Horseradish that you find on the shelf. Ketchup. Mustard. Meat flavorings and tenderizers. Bouillon cubes. Hot sauce and Tabasco sauce. Premade or packaged marinades. Premade or packaged taco seasonings. Relishes. Regular salad dressings. Where to find more information:  National Heart, Lung, and [place]: https://wilson-eaton.com/  American Heart Association: www.heart.org Summary  The DASH eating plan is a healthy eating plan that has been shown to reduce high blood pressure (hypertension). It may also reduce your risk for type 2 diabetes, heart disease, and stroke.  With the DASH eating plan, you should limit salt (sodium) intake to 2,300 mg a day. If you have hypertension, you may need to reduce your sodium intake to 1,500 mg a day.  When on the DASH eating plan, aim to eat more fresh fruits and vegetables, whole grains, lean proteins, low-fat dairy, and heart-healthy fats.  Work with your health care provider or diet and nutrition specialist (dietitian) to adjust your eating plan to your individual calorie needs. This information is not intended to replace advice given to you by your health care provider. Make sure you discuss any questions you have with your health care provider. Document Released: 11/01/2011 Document Revised: 11/05/2016 Document Reviewed: 11/05/2016 Elsevier Interactive Patient Education  2017 [employer].

## 2017-09-30 DIAGNOSIS — R918 Other nonspecific abnormal finding of lung field: Secondary | ICD-10-CM | POA: Diagnosis not present

## 2017-09-30 DIAGNOSIS — D0371 Melanoma in situ of right lower limb, including hip: Secondary | ICD-10-CM | POA: Diagnosis not present

## 2017-10-02 DIAGNOSIS — Z08 Encounter for follow-up examination after completed treatment for malignant neoplasm: Secondary | ICD-10-CM | POA: Diagnosis not present

## 2017-10-02 DIAGNOSIS — R198 Other specified symptoms and signs involving the digestive system and abdomen: Secondary | ICD-10-CM | POA: Diagnosis not present

## 2017-10-02 DIAGNOSIS — Z85820 Personal history of malignant melanoma of skin: Secondary | ICD-10-CM | POA: Diagnosis not present

## 2017-10-21 ENCOUNTER — Telehealth: Payer: Self-pay | Admitting: Family Medicine

## 2017-10-21 DIAGNOSIS — E1149 Type 2 diabetes mellitus with other diabetic neurological complication: Secondary | ICD-10-CM

## 2017-10-21 MED ORDER — METFORMIN HCL 1000 MG PO TABS: ORAL_TABLET | ORAL | 1 refills | Status: DC

## 2017-10-21 NOTE — Telephone Encounter (Signed)
Fax refill request for  glucophage 1000mg   #[phone]

## 2017-10-21 NOTE — Telephone Encounter (Signed)
Done

## 2017-10-23 ENCOUNTER — Other Ambulatory Visit: Payer: Self-pay | Admitting: Family Medicine

## 2017-10-27 ENCOUNTER — Other Ambulatory Visit: Payer: Self-pay | Admitting: Family Medicine

## 2017-10-27 DIAGNOSIS — E1149 Type 2 diabetes mellitus with other diabetic neurological complication: Secondary | ICD-10-CM

## 2017-11-05 ENCOUNTER — Other Ambulatory Visit: Payer: Self-pay | Admitting: Family Medicine

## 2017-11-28 DIAGNOSIS — M79645 Pain in left finger(s): Secondary | ICD-10-CM | POA: Diagnosis not present

## 2017-11-28 DIAGNOSIS — Z23 Encounter for immunization: Secondary | ICD-10-CM | POA: Diagnosis not present

## 2017-11-28 DIAGNOSIS — S61211S Laceration without foreign body of left index finger without damage to nail, sequela: Secondary | ICD-10-CM | POA: Diagnosis not present

## 2017-12-05 ENCOUNTER — Other Ambulatory Visit: Payer: Self-pay | Admitting: Family Medicine

## 2017-12-05 DIAGNOSIS — I10 Essential (primary) hypertension: Secondary | ICD-10-CM

## 2017-12-07 DIAGNOSIS — M79645 Pain in left finger(s): Secondary | ICD-10-CM | POA: Diagnosis not present

## 2017-12-07 DIAGNOSIS — S61211S Laceration without foreign body of left index finger without damage to nail, sequela: Secondary | ICD-10-CM | POA: Diagnosis not present

## 2017-12-09 ENCOUNTER — Other Ambulatory Visit: Payer: Self-pay | Admitting: *Deleted

## 2017-12-09 MED ORDER — EXENATIDE ER 2 MG/0.85ML ~~LOC~~ AUIJ: 2.0000 mg | AUTO-INJECTOR | SUBCUTANEOUS | 0 refills | Status: DC

## 2018-01-06 ENCOUNTER — Other Ambulatory Visit: Payer: Self-pay | Admitting: *Deleted

## 2018-01-06 MED ORDER — SYNTHROID 50 MCG PO TABS: 50.0000 ug | ORAL_TABLET | Freq: Every day | ORAL | 1 refills | Status: DC

## 2018-01-15 ENCOUNTER — Telehealth: Payer: Self-pay | Admitting: *Deleted

## 2018-01-15 NOTE — Telephone Encounter (Signed)
Patient called and she is going to [place] and her tour person told her it would be a good idea to bring along written rx's of all the meds she takes just in case her medications get lost or stolen. Is it ok if I print 30 day rx's for all 6 of her meds (or would handwritten be better so it doesn't confuse [place] in the system) and mail to her.

## 2018-01-15 NOTE — Telephone Encounter (Signed)
Fine to print 30d of all (just don't hit d/c to the ones in the system, and if you put the end date, they should just disappear after a month, no?)

## 2018-02-13 ENCOUNTER — Other Ambulatory Visit: Payer: Self-pay | Admitting: *Deleted

## 2018-02-13 MED ORDER — EXENATIDE ER 2 MG/0.85ML ~~LOC~~ AUIJ: 2.0000 mg | AUTO-INJECTOR | SUBCUTANEOUS | 0 refills | Status: DC

## 2018-02-19 ENCOUNTER — Encounter: Payer: Self-pay | Admitting: *Deleted

## 2018-02-26 DIAGNOSIS — R2241 Localized swelling, mass and lump, right lower limb: Secondary | ICD-10-CM | POA: Diagnosis not present

## 2018-02-26 DIAGNOSIS — I714 Abdominal aortic aneurysm, without rupture: Secondary | ICD-10-CM | POA: Diagnosis not present

## 2018-02-26 DIAGNOSIS — Z85828 Personal history of other malignant neoplasm of skin: Secondary | ICD-10-CM | POA: Diagnosis not present

## 2018-02-26 DIAGNOSIS — R918 Other nonspecific abnormal finding of lung field: Secondary | ICD-10-CM | POA: Diagnosis not present

## 2018-02-26 DIAGNOSIS — C439 Malignant melanoma of skin, unspecified: Secondary | ICD-10-CM | POA: Diagnosis not present

## 2018-03-05 ENCOUNTER — Other Ambulatory Visit: Payer: Self-pay | Admitting: Family Medicine

## 2018-03-05 DIAGNOSIS — I10 Essential (primary) hypertension: Secondary | ICD-10-CM

## 2018-03-19 DIAGNOSIS — D2271 Melanocytic nevi of right lower limb, including hip: Secondary | ICD-10-CM | POA: Diagnosis not present

## 2018-03-19 DIAGNOSIS — C4371 Malignant melanoma of right lower limb, including hip: Secondary | ICD-10-CM | POA: Diagnosis not present

## 2018-03-19 DIAGNOSIS — C4372 Malignant melanoma of left lower limb, including hip: Secondary | ICD-10-CM | POA: Diagnosis not present

## 2018-03-19 DIAGNOSIS — C792 Secondary malignant neoplasm of skin: Secondary | ICD-10-CM | POA: Diagnosis not present

## 2018-03-24 DIAGNOSIS — Z1231 Encounter for screening mammogram for malignant neoplasm of breast: Secondary | ICD-10-CM | POA: Diagnosis not present

## 2018-03-24 LAB — HM MAMMOGRAPHY

## 2018-03-27 ENCOUNTER — Other Ambulatory Visit: Payer: Self-pay | Admitting: *Deleted

## 2018-03-28 DIAGNOSIS — Z483 Aftercare following surgery for neoplasm: Secondary | ICD-10-CM | POA: Diagnosis not present

## 2018-03-28 DIAGNOSIS — C439 Malignant melanoma of skin, unspecified: Secondary | ICD-10-CM | POA: Diagnosis not present

## 2018-03-31 ENCOUNTER — Other Ambulatory Visit: Payer: Medicare Other

## 2018-03-31 DIAGNOSIS — I10 Essential (primary) hypertension: Secondary | ICD-10-CM

## 2018-03-31 DIAGNOSIS — C439 Malignant melanoma of skin, unspecified: Secondary | ICD-10-CM | POA: Diagnosis not present

## 2018-03-31 DIAGNOSIS — E7800 Pure hypercholesterolemia, unspecified: Secondary | ICD-10-CM | POA: Diagnosis not present

## 2018-03-31 DIAGNOSIS — Z5181 Encounter for therapeutic drug level monitoring: Secondary | ICD-10-CM

## 2018-03-31 DIAGNOSIS — E1149 Type 2 diabetes mellitus with other diabetic neurological complication: Secondary | ICD-10-CM

## 2018-03-31 NOTE — Addendum Note (Signed)
Addended by: Carolee Rota F on: 03/31/2018 03:50 PM   Modules accepted: Orders

## 2018-04-01 LAB — CBC WITH DIFFERENTIAL/PLATELET
BASOS ABS: 0 10*3/uL (ref 0.0–0.2)
Basos: 1 %
EOS (ABSOLUTE): 0.5 10*3/uL — ABNORMAL HIGH (ref 0.0–0.4)
Eos: 6 %
HEMATOCRIT: 42.7 % (ref 34.0–46.6)
Hemoglobin: 13.6 g/dL (ref 11.1–15.9)
IMMATURE GRANS (ABS): 0 10*3/uL (ref 0.0–0.1)
Immature Granulocytes: 0 %
LYMPHS ABS: 2.7 10*3/uL (ref 0.7–3.1)
LYMPHS: 31 %
MCH: 28.6 pg (ref 26.6–33.0)
MCHC: 31.9 g/dL (ref 31.5–35.7)
MCV: 90 fL (ref 79–97)
MONOCYTES: 7 %
Monocytes Absolute: 0.6 10*3/uL (ref 0.1–0.9)
Neutrophils Absolute: 4.8 10*3/uL (ref 1.4–7.0)
Neutrophils: 55 %
Platelets: 321 10*3/uL (ref 150–379)
RBC: 4.76 x10E6/uL (ref 3.77–5.28)
RDW: 15.4 % (ref 12.3–15.4)
WBC: 8.6 10*3/uL (ref 3.4–10.8)

## 2018-04-01 LAB — COMPREHENSIVE METABOLIC PANEL
ALK PHOS: 59 IU/L (ref 39–117)
ALT: 18 IU/L (ref 0–32)
AST: 24 IU/L (ref 0–40)
Albumin/Globulin Ratio: 1.6 (ref 1.2–2.2)
Albumin: 4.3 g/dL (ref 3.5–4.8)
BILIRUBIN TOTAL: 0.3 mg/dL (ref 0.0–1.2)
BUN/Creatinine Ratio: 17 (ref 12–28)
BUN: 16 mg/dL (ref 8–27)
CO2: 28 mmol/L (ref 20–29)
CREATININE: 0.96 mg/dL (ref 0.57–1.00)
Calcium: 10.4 mg/dL — ABNORMAL HIGH (ref 8.7–10.3)
Chloride: 96 mmol/L (ref 96–106)
GFR calc Af Amer: 65 mL/min/{1.73_m2} (ref >59)
GFR calc non Af Amer: 56 mL/min/{1.73_m2} — ABNORMAL LOW (ref >59)
GLOBULIN, TOTAL: 2.7 g/dL (ref 1.5–4.5)
Glucose: 91 mg/dL (ref 65–99)
POTASSIUM: 4.6 mmol/L (ref 3.5–5.2)
SODIUM: 140 mmol/L (ref 134–144)
Total Protein: 7 g/dL (ref 6.0–8.5)

## 2018-04-01 LAB — HEMOGLOBIN A1C
ESTIMATED AVERAGE GLUCOSE: 137 mg/dL
Hgb A1c MFr Bld: 6.4 % — ABNORMAL HIGH (ref 4.8–5.6)

## 2018-04-02 NOTE — Progress Notes (Signed)
Chief Complaint  Patient presents with  . Medicare Wellness    nonfasting AWV, labs already done with pelvic. Has appt with Dr. Gershon Crane in June. No concerns.     Tonya Rogers is a 82 y.o. female who presents for annual wellness visit and follow-up on chronic medical conditions.   DM: Doing well on the B-cise pen. Sugars have been running 92-130. Denies hypoglycemia, polydipsia or polyuria (goes more frequently because staying well hydrated). Has some neuropathy--tingling in feet (not in hands), a mild burning, not painful or bothersome, unchanged/chronic. Last eye exam was 04/2017, no retinopathy. She checks her feet regularly, no sores/concerns.  Hyperlipidemia--She is compliant with her medications, taking '20mg'$  of simvastatin and '3000mg'$  of fish oil. Denies any side effects to the medications, and is following a lowfat, low cholesterol diet. Last lipids were at goal. Lab Results  Component Value Date   CHOL 149 09/16/2017   HDL 85 09/16/2017   LDLCALC 47 09/16/2017   TRIG 90 09/16/2017   CHOLHDL 1.8 09/16/2017    Hypertension follow-up: Blood pressures elsewhere are 119-140/50's-78 (usually 120's-130). Denies dizziness, headaches, chest pain. Denies side effects of medications.  Hypothyroidism: She denies any missed pills, taking the branded Synthroid. She has always been crushing this, mixing it with water, as she had trouble swallowing the pill when she tried swallowing it whole. She has been taking it this way for many years. Denies any change in weight, moods, bowels, temperature intolerance. She has had some issues with nail splitting over the years, but Biotin has helped. Constipation is managed well with prunes.  Melanoma--She had biopsies of RLE a couple of weeks ago at [facility]  2/3 areas biopsied showed melanoma, with negative margins.  CT recently normal (4/3 at [facility]).  Osteoporosis: She took fosamax x 5 years, stopped in 6659 due to jaw complications. She  continues to take regular calcium, Vitamin D, and gets some weight-bearing exercise. She had bone density test 4/18. T-2.3 R fem neck. slight improvement, no worsening. Abnl FRAX 22% major osteoporotic fracture, 6.3% hip   Immunization History  Administered Date(s) Administered  . Influenza Split 08/26/2013, 09/08/2014, 08/17/2015  . Influenza, High Dose Seasonal PF 09/12/2017  . Influenza-Unspecified 08/20/2016  . Pneumococcal Conjugate-13 08/26/2014  . Pneumococcal Polysaccharide-23 12/27/2000, 07/27/2008  . Td 03/26/2005  . Tdap 03/05/2012  . Zoster 02/25/2011  . Zoster Recombinat (Shingrix) 02/11/2018  she reports she had first Shingrix 08/2017, will get [place] the date. Also had another tetanus shot in January at [facility] when she cut her finger. Last Pap smear: 10/2013, negative, no high risk HPV present Last mammogram: 02/2018 Last colonoscopy: 8/06; negative Cologard 01/2016 Last DEXA: 02/2017 T-2.3 R fem neck. slight improvement, no worsening. Abnl FRAX 22% major osteoporotic fracture, 6.3% hip Dentist: every 6 months Ophtho: yearly in June Exercise: gym 3-4x/week, treadmill, and a 30 min program with 8-10 machines, and bike, rowing machines. She is there 60-90 minutes, at least half is cardio.  Other doctors caring for patient include: Dentist: Dr. Tyrone Sage (now Dr. Oren Binet, got married) at [facility] Ophtho: Dr. Gershon Crane ENT: Dr. Benjamine Mola Dr. Dub Amis at [facility] for her melanoma Dermatologist: Dr. Allyson Sabal (now scheduled to see Dr. Pearline Cables) GI: Sadie Haber   Depression screen: negative Fall screen: none in the last year Functional Status Screen: notable for occasional stress urinary incontinence, decreased hearing, tinnitus, wears hearing aids. Mini-Cog screen normal (score of 5)  End of Life Discussion: Patient hasa living will and medical power of attorney  Past Medical  History:  Diagnosis Date  . Alopecia    anterior hair loss  . Anxiety and  depression    related to work--resolved  . Baker's cyst of knee    R popliteal  . Cholelithiasis 12/09   asymptomatic, noticed on CT 12/09  . 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 Diabetes mellitus 2002  . Diabetic neuropathy ([place])   . Diverticulosis of colon   . Hearing loss    hearing aids bilaterally  . Hypertension   . Melanoma ([place]) 7/09   RLE with in-transit mets; s/p hyperthermic limb perfusion chemo and excision; re-excision of recurrence 2011  . Metatarsal fracture 10/2009   L 5th--Dr. Rip Harbour  . Osteoporosis   . Pulmonary nodules    small, seen on CT 12/09  . Pure hypercholesterolemia     Past Surgical History:  Procedure Laterality Date  . CATARACT EXTRACTION, BILATERAL  01/2011  . excision of melanoma  11/23/08   wide excision of melanoma and R inguinal sentinel LN mapping and  biopsy  . excision of recurrent melanoma  2011, 06/2014, 02/2018   RLE  . TONSILLECTOMY AND ADENOIDECTOMY    . TYMPANOSTOMY TUBE PLACEMENT  2013   L ear  (Dr. Benjamine Mola)    Social History   Socioeconomic History  . Marital status: Divorced    Spouse name: Not on file  . Number of children: 2  . Years of education: Not on file  . Highest education level: Not on file  Occupational History  . 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 Financial resource strain: Not on file  . Food insecurity:    Worry: Not on file    Inability: Not on file  . Transportation needs:    Medical: Not on file    Non-medical: Not on file  Tobacco Use  . Smoking status: Never Smoker  . Smokeless tobacco: Never Used  Substance and Sexual Activity  . Alcohol use: Yes    Alcohol/week: 0.0 oz    Comment: 3-4 oz wine  twice a week  . Drug use: No  . Sexual activity: Not Currently  Lifestyle  . Physical activity:    Days per week: Not on file    Minutes per session: Not on file  . Stress: Not on file  Relationships  . Social  connections:    Talks on phone: Not on file    Gets together: Not on file    Attends religious service: Not on file    Active member of club or organization: Not on file    Attends meetings of clubs or organizations: Not on file    Relationship status: Not on file  . Intimate partner violence:    Fear of current or ex partner: Not on file    Emotionally abused: Not on file    Physically abused: Not on file    Forced sexual activity: Not on file  Other Topics Concern  . Not on file  Social History Narrative   Daughter in [place] and son in [place]. 4 grandchildren.  1 great grandchild expected (in [place]).   Lives alone    Family History  Problem Relation Age of Onset  . Hypertension Mother   . Stroke Mother   . Arthritis Father   . Hemophilia Father   . Cancer Other        breast  . Diabetes Neg Hx   . Heart disease Neg  Hx     Outpatient Encounter Medications as of 04/03/2018  Medication Sig  . aspirin 81 MG tablet Take 81 mg by mouth every other day.   Marland Kitchen BIOTIN PO Take 1 tablet by mouth daily.  . Calcium Carbonate-Vitamin D (CALCIUM + D) 600-200 MG-UNIT TABS Take 1 tablet by mouth daily.   . Cholecalciferol (VITAMIN D) 1000 UNITS capsule Take 1,000 Units by mouth daily.   . Coenzyme Q10 (CO Q 10) 100 MG CAPS Take 1 capsule by mouth daily.  . Exenatide ER (BYDUREON BCISE) 2 MG/0.85ML AUIJ Inject 2 mg into the skin once a week.  . fish oil-omega-3 fatty acids 1000 MG capsule Take 3 g by mouth daily.   Marland Kitchen glucose blood test strip 1 each by Other route 2 (two) times daily. Test twice daily  . Insulin Pen Needle (BD PEN NEEDLE NANO U/F) 32G X 4 MM MISC USE AS DIRECTED WITH BYETTA(TWICE DAILY)  . losartan (COZAAR) 100 MG tablet TAKE 1 TABLET(100 MG) BY MOUTH DAILY  . magnesium gluconate (MAGONATE) 500 MG tablet Take 500 mg by mouth 2 (two) times daily.    . metFORMIN (GLUCOPHAGE) 1000 MG tablet TAKE 1 TABLET BY MOUTH TWO  TIMES DAILY WITH MEALS  . Multiple Vitamins-Minerals  (MULTIVITAMIN WITH MINERALS) tablet Take 1 tablet by mouth daily.    . pioglitazone (ACTOS) 15 MG tablet TAKE 1 TABLET BY MOUTH  DAILY  . simvastatin (ZOCOR) 20 MG tablet TAKE 1 TABLET BY MOUTH  DAILY  . SYNTHROID 50 MCG tablet Take 1 tablet (50 mcg total) by mouth daily before breakfast.  . vitamin C (ASCORBIC ACID) 500 MG tablet Take 500 mg by mouth daily.     No facility-administered encounter medications on file as of 04/03/2018.     No Known Allergies   ROS: The patient denies anorexia, fever, weight changes, headaches, vision changes, ear pain, sore throat, breast concerns, chest pain, palpitations, dizziness, syncope, dyspnea on exertion, cough, swelling, nausea, vomiting, diarrhea, abdominal pain, melena, hematochezia, indigestion/heartburn, hematuria, incontinence (just with cough/sneeze), dysuria,vaginal bleeding, discharge, odor or itch, genital lesions, joint pains, weakness, tremor, suspicious skin lesions, depression, anxiety, abnormal bleeding/bruising (mild, chronic), or enlarged lymph nodes. Constipation, unchanged/chronic--controlled Hearing loss L>R ear, unchanged. Has hearing aids--not wearing today, doesn't find them all that helpful. +tingling/mild burning in feet, chronic, unchanged. Dry mouth, which affected her teeth.  This is much better now. Still using fluoride trays at night. Denies any pain.    PHYSICAL EXAM:  BP 128/70   Pulse 76   Ht 5' (1.524 m)   Wt 172 lb 6.4 oz (78.2 kg)   BMI 33.67 kg/m   Wt Readings from Last 3 Encounters:  04/03/18 172 lb 6.4 oz (78.2 kg)  09/19/17 170 lb (77.1 kg)  03/21/17 169 lb 6.4 oz (76.8 kg)    General Appearance:   Alert, cooperative, no distress, appears stated age  Head:   Normocephalic, without obvious abnormality, atraumatic. Thinning of hair anteriorly  Eyes:   PERRL, conjunctiva/corneas clear, EOM's intact, fundi benign  Ears:   Normal externally.TM's and EAC's normal  Nose:  Nares  normal, mucosa normal, no drainage or sinus tenderness  Throat:  Lips, mucosa, and tongue normal; teeth and gums normal  Neck:  Supple, no lymphadenopathy; thyroid: no enlargement/tenderness/nodules; no carotid bruit or JVD  Back:  Spine nontender, no curvature, ROM normal, no CVA tenderness  Lungs:   Clear to auscultation bilaterally without wheezes, rales or ronchi; respirations unlabored  Chest Wall:  No tenderness or deformity  Heart:   Regular rate and rhythm, S1 and S2 normal, 2/6 systolic ejection murmur is present; no rub or gallop  Breast Exam:   No tenderness, masses, or nipple discharge or inversion. No axillary lymphadenopathy  Abdomen:   Soft, non-tender, nondistended, normoactive bowel sounds, no masses, no hepatosplenomegaly.   Genitalia:   Normal external genitalia without lesions, atrophic changes noted. BUS and vagina normal; no cervical motion tenderness.No abnormal vaginal discharge. Uterus and adnexa not enlarged, nontender, no masses; exam somewhat limited by body habitus. Pap not performed  Rectal:   Normal tone, no masses or tenderness; heme negative stool  Extremities:  No clubbing, cyanosis or edema.  Pulses:  2+ and symmetric all extremities  Skin:  Skin color, texture, turgor normal, no rashes or lesions. Healing areas x 3 on RLL.  Normal sensation to monofilament.  Lymph nodes:  Cervical, supraclavicular, and axillary nodes normal  Neurologic:  CNII-XII intact, normal strength, sensation and gait; reflexes 2+ and symmetric throughout   Psych: Normal mood, affect, hygiene and grooming  Diabetic foot exam--normal.   Lab Results  Component Value Date   HGBA1C 6.4 (H) 03/31/2018     Chemistry      Component Value Date/Time   NA 140 03/31/2018 1603   K 4.6 03/31/2018 1603   CL 96 03/31/2018 1603   CO2 28 03/31/2018 1603   BUN 16 03/31/2018 1603    CREATININE 0.96 03/31/2018 1603   CREATININE 0.99 (H) 09/16/2017 1004      Component Value Date/Time   CALCIUM 10.4 (H) 03/31/2018 1603   CALCIUM 9.7 04/23/2012 0915   ALKPHOS 59 03/31/2018 1603   AST 24 03/31/2018 1603   ALT 18 03/31/2018 1603   BILITOT 0.3 03/31/2018 1603     Fasting glu 91  Lab Results  Component Value Date   WBC 8.6 03/31/2018   HGB 13.6 03/31/2018   HCT 42.7 03/31/2018   MCV 90 03/31/2018   PLT 321 03/31/2018     ASSESSMENT/PLAN:  Medicare annual wellness visit, subsequent  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 Recheck next year.  Medication monitoring encounter  Immunization due - Plan: Pneumococcal polysaccharide vaccine 23-valent greater than or equal to 2yo subcutaneous/IM   Pt to try and get [place] the October Shingrix date from pharmacy. Sign release to get tetanus date from UC (and type of tetanus).   Discussed monthly self breast exams and yearly mammograms; at least 30 minutes of aerobic activity at least 5 days/week and weight-bearing exercise 2x/week; proper sunscreen use reviewed; healthy diet, including goals of calcium and vitamin D intake and alcohol recommendations (less than or equal to 1 drink/day) reviewed; regular seatbelt use; changing batteries in smoke detectors. Immunization recommendations discussed--continue yearly high dose flu shots.  Completed Shingrix series (need to get date of 1st shot); pneumovax booster given. Colonoscopy recommendations reviewed, was due 06/2015.Had negative cologard 3//2017, due again 01/2019.  MOST form reviewed and updated, Full Code, Full  care.   F/u 6 months, fasting labs prior--A1c, lipid, TSH, c-met, urine microalb/Cr   Medicare Attestation I have personally reviewed: The patient's medical and social history Their use of alcohol, tobacco or illicit drugs Their current medications and supplements  The patient's functional ability including ADLs,fall risks, home safety risks, cognitive, and hearing and visual impairment Diet and physical activities Evidence for depression or mood disorders  The patient's weight, height and BMI have been recorded in the chart.  I have made referrals, counseling, and provided education to the patient based on review of the above and I have provided the patient with a written personalized care plan for preventive services.

## 2018-04-03 ENCOUNTER — Ambulatory Visit (INDEPENDENT_AMBULATORY_CARE_PROVIDER_SITE_OTHER): Payer: Medicare Other | Admitting: Family Medicine

## 2018-04-03 ENCOUNTER — Encounter: Payer: Self-pay | Admitting: Family Medicine

## 2018-04-03 VITALS — BP 128/70 | HR 76 | Ht 60.0 in | Wt 172.4 lb

## 2018-04-03 DIAGNOSIS — I10 Essential (primary) hypertension: Secondary | ICD-10-CM | POA: Diagnosis not present

## 2018-04-03 DIAGNOSIS — Z23 Encounter for immunization: Secondary | ICD-10-CM | POA: Diagnosis not present

## 2018-04-03 DIAGNOSIS — Z5181 Encounter for therapeutic drug level monitoring: Secondary | ICD-10-CM | POA: Diagnosis not present

## 2018-04-03 DIAGNOSIS — C439 Malignant melanoma of skin, unspecified: Secondary | ICD-10-CM | POA: Diagnosis not present

## 2018-04-03 DIAGNOSIS — E1149 Type 2 diabetes mellitus with other diabetic neurological complication: Secondary | ICD-10-CM | POA: Diagnosis not present

## 2018-04-03 DIAGNOSIS — E7800 Pure hypercholesterolemia, unspecified: Secondary | ICD-10-CM | POA: Diagnosis not present

## 2018-04-03 DIAGNOSIS — R809 Proteinuria, unspecified: Secondary | ICD-10-CM

## 2018-04-03 DIAGNOSIS — Z0000 Encounter for general adult medical examination without abnormal findings: Secondary | ICD-10-CM | POA: Diagnosis not present

## 2018-04-03 DIAGNOSIS — M8580 Other specified disorders of bone density and structure, unspecified site: Secondary | ICD-10-CM | POA: Diagnosis not present

## 2018-04-03 DIAGNOSIS — E039 Hypothyroidism, unspecified: Secondary | ICD-10-CM

## 2018-04-03 NOTE — Patient Instructions (Addendum)
HEALTH MAINTENANCE RECOMMENDATIONS:  It is recommended that you get at least 30 minutes of aerobic exercise at least 5 days/week (for weight loss, you may need as much as 60-90 minutes). This can be any activity that gets your heart rate up. This can be divided in 10-15 minute intervals if needed, but try and build up your endurance at least once a week.  Weight bearing exercise is also recommended twice weekly.  Eat a healthy diet with lots of vegetables, fruits and fiber.  "Colorful" foods have a lot of vitamins (ie green vegetables, tomatoes, red peppers, etc).  Limit sweet tea, regular sodas and alcoholic beverages, all of which has a lot of calories and sugar.  Up to 1 alcoholic drink daily may be beneficial for women (unless trying to lose weight, watch sugars).  Drink a lot of water.  Calcium recommendations are 1200-1500 mg daily (1500 mg for postmenopausal women or women without ovaries), and vitamin D 1000 IU daily.  This should be obtained from diet and/or supplements (vitamins), and calcium should not be taken all at once, but in divided doses.  Monthly self breast exams and yearly mammograms for women over the age of 63 is recommended.  Sunscreen of at least SPF 30 should be used on all sun-exposed parts of the skin when outside between the hours of 10 am and 4 pm (not just when at beach or pool, but even with exercise, golf, tennis, and yard work!)  Use a sunscreen that says "broad spectrum" so it covers both UVA and UVB rays, and make sure to reapply every 1-2 hours.  Remember to change the batteries in your smoke detectors when changing your clock times in the spring and fall.  Use your seat belt every time you are in a car, and please drive safely and not be distracted with cell phones and texting while driving.   Tonya Rogers , Thank you for taking time to come for your Medicare Wellness Visit. I appreciate your ongoing commitment to your health goals. Please review the  following plan we discussed and let me know if I can assist you in the future.   These are the goals we discussed: Goals    None      This is a list of the screening recommended for you and due dates:  Health Maintenance  Topic Date Due  . Complete foot exam   03/21/2018  . Eye exam for diabetics  05/02/2018  . Flu Shot  06/26/2018  . Hemoglobin A1C  10/01/2018  . Tetanus Vaccine  03/05/2022  . DEXA scan (bone density measurement)  Completed  . Pneumonia vaccines  Completed   Please get [place] the date of your first Shingrix in October. We will try and get the date of your last tetanus shot from the urgent care. Bone density test is due again next year, and Cologard screening for colon cancer in 2020.   MyPlate from USDA The general, healthful diet is based on the 2010 Dietary Guidelines for Americans. The amount of food you need to eat from each food group depends on your age, sex, and level of physical activity and can be individualized by a dietitian. Go to [employer] for more information. What do I need to know about the MyPlate plan?  Enjoy your food, but eat less.  Avoid oversized portions. ?  of your plate should include fruits and vegetables. ?  of your plate should be grains. ?  of your plate should be protein.  Grains  Make at least half of your grains whole grains.  For a 2,000 calorie daily food plan, eat 6 oz every day.  1 oz is about 1 slice bread, 1 cup cereal, or  cup cooked rice, cereal, or pasta. Vegetables  Make half your plate fruits and vegetables.  For a 2,000 calorie daily food plan, eat 2 cups every day.  1 cup is about 1 cup raw or cooked vegetables or vegetable juice or 2 cups raw leafy greens. Fruits  Make half your plate fruits and vegetables.  For a 2,000 calorie daily food plan, eat 2 cups every day.  1 cup is about 1 cup fruit or 100% fruit juice or  cup dried fruit. Protein  For a 2,000 calorie daily food plan, eat 5 oz  every day.  1 oz is about 1 oz meat, poultry, or fish,  cup cooked beans, 1 egg, 1 Tbsp peanut butter, or  oz nuts or seeds. Dairy  Switch to fat-free or low-fat (1%) milk.  For a 2,000 calorie daily food plan, eat 3 cups every day.  1 cup is about 1 cup milk or yogurt or soy milk (soy beverage), 1 oz natural cheese, or 2 oz processed cheese. Fats, Oils, and Empty Calories  Only small amounts of oils are recommended.  Empty calories are calories from solid fats or added sugars.  Compare sodium in foods like soup, bread, and frozen meals. Choose the foods with lower numbers.  Drink water instead of sugary drinks. What foods can I eat? Grains Whole grains such as whole wheat, quinoa, millet, and bulgur. Bread, rolls, and pasta made from whole grains. Brown or wild rice. Hot or cold cereals made from whole grains and without added sugar. Vegetables All fresh vegetables, especially fresh red, dark green, or orange vegetables. Peas and beans. Low-sodium frozen or canned vegetables prepared without added salt. Low-sodium vegetable juices. Fruits All fresh, frozen, and dried fruits. Canned fruit packed in water or fruit juice without added sugar. Fruit juices without added sugar. Meats and Other Protein Sources Boiled, baked, or grilled lean meat trimmed of fat. Skinless poultry. Fresh seafood and shellfish. Canned seafood packed in water. Unsalted nuts and unsalted nut butters. Tofu. Dried beans and pea. Eggs. Dairy Low-fat or fat-free milk, yogurt, and cheeses. Sweets and Desserts Frozen desserts made from low-fat milk. Fats and Oils Olive, peanut, and canola oils and margarine. Salad dressing and mayonnaise made from these oils. Other Soups and casseroles made from allowed ingredients and without added fat or salt. The items listed above may not be a complete list of recommended foods or beverages. Contact your dietitian for more options. What foods are not  recommended? Grains Sweetened, low-fiber cereals. Packaged baked goods. Snack crackers and chips. Cheese crackers, butter crackers, and biscuits. Frozen waffles, sweet breads, doughnuts, pastries, packaged baking mixes, pancakes, cakes, and cookies. Vegetables Regular canned or frozen vegetables or vegetables prepared with salt. Canned tomatoes. Canned tomato sauce. Fried vegetables. Vegetables in cream sauce or cheese sauce. Fruits Fruits packed in syrup or made with added sugar. Meats and Other Protein Sources Marbled or fatty meats such as ribs. Poultry with skin. Fried meats, poultry, eggs, or fish. Sausages, hot dogs, and deli meats such as pastrami, bologna, or salami. Dairy Whole milk, cream, cheeses made from whole milk, sour cream. Ice cream or yogurt made from whole milk or with added sugar. Beverages For adults, no more than one alcoholic drink per day. Regular soft drinks or other  sugary beverages. Juice drinks. Sweets and Desserts Sugary or fatty desserts, candy, and other sweets. Fats and Oils Solid shortening or partially hydrogenated oils. Solid margarine. Margarine that contains trans fats. Butter. The items listed above may not be a complete list of foods and beverages to avoid. Contact your dietitian for more information. This information is not intended to replace advice given to you by your health care provider. Make sure you discuss any questions you have with your health care provider. Document Released: 12/02/2007 Document Revised: 04/19/2016 Document Reviewed: 10/21/2013 Elsevier Interactive Patient Education  [employer].

## 2018-04-04 ENCOUNTER — Telehealth: Payer: Self-pay | Admitting: Family Medicine

## 2018-04-04 NOTE — Telephone Encounter (Signed)
Received requested records form [facility]. Sending back for review.

## 2018-04-07 ENCOUNTER — Other Ambulatory Visit: Payer: Self-pay | Admitting: *Deleted

## 2018-04-07 MED ORDER — SYNTHROID 50 MCG PO TABS: 50.0000 ug | ORAL_TABLET | Freq: Every day | ORAL | 1 refills | Status: DC

## 2018-04-14 DIAGNOSIS — Z4802 Encounter for removal of sutures: Secondary | ICD-10-CM | POA: Diagnosis not present

## 2018-04-14 DIAGNOSIS — Z85820 Personal history of malignant melanoma of skin: Secondary | ICD-10-CM | POA: Diagnosis not present

## 2018-04-14 DIAGNOSIS — Z483 Aftercare following surgery for neoplasm: Secondary | ICD-10-CM | POA: Diagnosis not present

## 2018-05-05 ENCOUNTER — Telehealth: Payer: Self-pay | Admitting: Internal Medicine

## 2018-05-05 MED ORDER — EXENATIDE ER 2 MG/0.85ML ~~LOC~~ AUIJ: 2.0000 mg | AUTO-INJECTOR | SUBCUTANEOUS | 1 refills | Status: DC

## 2018-05-05 NOTE — Telephone Encounter (Signed)
Pt called and left a message needing a refill on her bcise to [facility]. Pt just had labs done. I will refill for 6 months

## 2018-05-05 NOTE — Telephone Encounter (Signed)
I have sent refill in

## 2018-05-08 DIAGNOSIS — Z961 Presence of intraocular lens: Secondary | ICD-10-CM | POA: Diagnosis not present

## 2018-05-08 DIAGNOSIS — Z7984 Long term (current) use of oral hypoglycemic drugs: Secondary | ICD-10-CM | POA: Diagnosis not present

## 2018-05-08 DIAGNOSIS — E119 Type 2 diabetes mellitus without complications: Secondary | ICD-10-CM | POA: Diagnosis not present

## 2018-05-08 LAB — HM DIABETES EYE EXAM

## 2018-06-01 ENCOUNTER — Other Ambulatory Visit: Payer: Self-pay | Admitting: Family Medicine

## 2018-06-01 DIAGNOSIS — I10 Essential (primary) hypertension: Secondary | ICD-10-CM

## 2018-06-07 ENCOUNTER — Other Ambulatory Visit: Payer: Self-pay | Admitting: Family Medicine

## 2018-06-07 DIAGNOSIS — E1149 Type 2 diabetes mellitus with other diabetic neurological complication: Secondary | ICD-10-CM

## 2018-06-09 ENCOUNTER — Other Ambulatory Visit: Payer: Self-pay | Admitting: Family Medicine

## 2018-06-09 DIAGNOSIS — E1149 Type 2 diabetes mellitus with other diabetic neurological complication: Secondary | ICD-10-CM

## 2018-06-11 DIAGNOSIS — H838X3 Other specified diseases of inner ear, bilateral: Secondary | ICD-10-CM | POA: Diagnosis not present

## 2018-06-11 DIAGNOSIS — H6122 Impacted cerumen, left ear: Secondary | ICD-10-CM | POA: Diagnosis not present

## 2018-06-11 DIAGNOSIS — H903 Sensorineural hearing loss, bilateral: Secondary | ICD-10-CM | POA: Diagnosis not present

## 2018-06-12 DIAGNOSIS — L821 Other seborrheic keratosis: Secondary | ICD-10-CM | POA: Diagnosis not present

## 2018-06-12 DIAGNOSIS — D1801 Hemangioma of skin and subcutaneous tissue: Secondary | ICD-10-CM | POA: Diagnosis not present

## 2018-06-12 DIAGNOSIS — L819 Disorder of pigmentation, unspecified: Secondary | ICD-10-CM | POA: Diagnosis not present

## 2018-06-12 DIAGNOSIS — D229 Melanocytic nevi, unspecified: Secondary | ICD-10-CM | POA: Diagnosis not present

## 2018-06-12 DIAGNOSIS — L814 Other melanin hyperpigmentation: Secondary | ICD-10-CM | POA: Diagnosis not present

## 2018-07-09 ENCOUNTER — Other Ambulatory Visit: Payer: Self-pay | Admitting: *Deleted

## 2018-07-09 MED ORDER — SYNTHROID 50 MCG PO TABS: 50.0000 ug | ORAL_TABLET | Freq: Every day | ORAL | 1 refills | Status: DC

## 2018-07-30 DIAGNOSIS — C4371 Malignant melanoma of right lower limb, including hip: Secondary | ICD-10-CM | POA: Diagnosis not present

## 2018-07-30 DIAGNOSIS — Z08 Encounter for follow-up examination after completed treatment for malignant neoplasm: Secondary | ICD-10-CM | POA: Diagnosis not present

## 2018-07-30 DIAGNOSIS — Z85820 Personal history of malignant melanoma of skin: Secondary | ICD-10-CM | POA: Diagnosis not present

## 2018-08-06 DIAGNOSIS — Z23 Encounter for immunization: Secondary | ICD-10-CM | POA: Diagnosis not present

## 2018-09-09 DIAGNOSIS — M79645 Pain in left finger(s): Secondary | ICD-10-CM | POA: Diagnosis not present

## 2018-09-09 DIAGNOSIS — S63259A Unspecified dislocation of unspecified finger, initial encounter: Secondary | ICD-10-CM | POA: Diagnosis not present

## 2018-09-09 DIAGNOSIS — W19XXXS Unspecified fall, sequela: Secondary | ICD-10-CM | POA: Diagnosis not present

## 2018-10-02 ENCOUNTER — Other Ambulatory Visit: Payer: Self-pay | Admitting: *Deleted

## 2018-10-02 MED ORDER — SYNTHROID 50 MCG PO TABS: 50.0000 ug | ORAL_TABLET | Freq: Every day | ORAL | 0 refills | Status: DC

## 2018-10-06 ENCOUNTER — Other Ambulatory Visit: Payer: Medicare Other

## 2018-10-06 DIAGNOSIS — E1149 Type 2 diabetes mellitus with other diabetic neurological complication: Secondary | ICD-10-CM

## 2018-10-06 DIAGNOSIS — R809 Proteinuria, unspecified: Secondary | ICD-10-CM | POA: Diagnosis not present

## 2018-10-06 DIAGNOSIS — Z5181 Encounter for therapeutic drug level monitoring: Secondary | ICD-10-CM | POA: Diagnosis not present

## 2018-10-06 DIAGNOSIS — E039 Hypothyroidism, unspecified: Secondary | ICD-10-CM | POA: Diagnosis not present

## 2018-10-06 DIAGNOSIS — E7800 Pure hypercholesterolemia, unspecified: Secondary | ICD-10-CM

## 2018-10-06 DIAGNOSIS — I10 Essential (primary) hypertension: Secondary | ICD-10-CM

## 2018-10-07 LAB — COMPREHENSIVE METABOLIC PANEL
ALT: 20 IU/L (ref 0–32)
AST: 25 IU/L (ref 0–40)
Albumin/Globulin Ratio: 1.7 (ref 1.2–2.2)
Albumin: 4.5 g/dL (ref 3.5–4.7)
Alkaline Phosphatase: 69 IU/L (ref 39–117)
BUN/Creatinine Ratio: 15 (ref 12–28)
BUN: 15 mg/dL (ref 8–27)
Bilirubin Total: 0.5 mg/dL (ref 0.0–1.2)
CALCIUM: 9.9 mg/dL (ref 8.7–10.3)
CO2: 25 mmol/L (ref 20–29)
CREATININE: 1.01 mg/dL — AB (ref 0.57–1.00)
Chloride: 99 mmol/L (ref 96–106)
GFR calc Af Amer: 61 mL/min/{1.73_m2} (ref >59)
GFR, EST NON AFRICAN AMERICAN: 53 mL/min/{1.73_m2} — AB (ref >59)
Globulin, Total: 2.6 g/dL (ref 1.5–4.5)
Glucose: 125 mg/dL — ABNORMAL HIGH (ref 65–99)
POTASSIUM: 4.9 mmol/L (ref 3.5–5.2)
Sodium: 140 mmol/L (ref 134–144)
Total Protein: 7.1 g/dL (ref 6.0–8.5)

## 2018-10-07 LAB — LIPID PANEL
Chol/HDL Ratio: 1.8 ratio (ref 0.0–4.4)
Cholesterol, Total: 144 mg/dL (ref 100–199)
HDL: 79 mg/dL (ref >39)
LDL Calculated: 48 mg/dL (ref 0–99)
TRIGLYCERIDES: 85 mg/dL (ref 0–149)
VLDL Cholesterol Cal: 17 mg/dL (ref 5–40)

## 2018-10-07 LAB — MICROALBUMIN / CREATININE URINE RATIO
Creatinine, Urine: 133.7 mg/dL
MICROALB/CREAT RATIO: 200.6 mg/g{creat} — AB (ref 0.0–30.0)
MICROALBUM., U, RANDOM: 268.2 ug/mL

## 2018-10-07 LAB — HEMOGLOBIN A1C
Est. average glucose Bld gHb Est-mCnc: 143 mg/dL
Hgb A1c MFr Bld: 6.6 % — ABNORMAL HIGH (ref 4.8–5.6)

## 2018-10-07 LAB — TSH: TSH: 1.73 u[IU]/mL (ref 0.450–4.500)

## 2018-10-07 NOTE — Progress Notes (Signed)
Chief Complaint  Patient presents with  . Diabetes    nonfasting med check. No new med check. Still having some rib pain on the right side from her fall.    [place] 09/09/18 (tripped on brick in her yard)--dislocated her left 5th finger, cut her lip. Seen at [place] [facility]--see Media section for the scanned notes.  Didn't realize at the time that she also injured her right ribs, but they remain sore since the fall.  Heat helps some; sometimes takes tylenol or aleve, and it helps. Gradually improving.  Denies pain with breathing, shortness of breath.  Bruising was present, resolved.  OM:AYOKH well on the B-cise pen, in addition to pioglitazone and metformin. Sugars have been running up to 130 fasting;doesn't check other times of the day.  Denies hypoglycemia, polydipsia or polyuria. Has some neuropathy--tingling in feet (not in hands), a mild burning, not painful or bothersome, unchanged/chronic. Last eye exam was 04/2018, no retinopathy. She checks her feet regularly, no sores/concerns.  Hyperlipidemia--She is compliant with her medications, taking '20mg'$  of simvastatin and '3000mg'$  of fish oil. Denies any side effects to the medications, and is following a lowfat, low cholesterol diet.   Hypertension follow-up: Blood pressures elsewhere are up to 140/80, usually running 120's/70's. Denies dizziness, headaches, chest pain. Denies side effects of medications.  Hypothyroidism: She denies any missed pills, taking the branded Synthroid. She has always been crushing this, mixing it with water, as she had trouble swallowing the pill when she tried swallowing it whole. She has been taking it this way for many years. Denies any change in weight, moods, bowels, temperature intolerance. She has had some issues with nail splitting over the years, butBiotin has helped. Constipation is managed well with prunes.  Melanoma--She last saw Dr. Clovis Riley at [facility] in September.  Last biopsies/surgeries (which  showed melanoma with clear margins) were in April, along with negative imaging.  She has had multiple in-transit lesions over the years since her original diagnosis/treatment in 2009. He reported that she had no evidence of local recurrence on exam, with plans to f/u with imaging in the Spring (6 mo f/u).  There is a raised area that they have been keeping an eye on.  Osteoporosis: She took fosamax x 5 years, stopped in 9977 due to jaw complications. She continues to take regular calcium, Vitamin D, and gets some weight-bearing exercise. She had bone density test 4/18. T-2.3 R fem neck. slight improvement, no worsening. Abnl FRAX 22% major osteoporotic fracture, 6.3% hip.  As discussed at her physical, plan is to recheck DEXA next year (spring 2020).  PMH, PSH, SH reviewed  Outpatient Encounter Medications as of 10/08/2018  Medication Sig  . aspirin 81 MG tablet Take 81 mg by mouth every other day.   Marland Kitchen BIOTIN PO Take 1 tablet by mouth daily.  . Calcium Carbonate-Vitamin D (CALCIUM + D) 600-200 MG-UNIT TABS Take 1 tablet by mouth daily.   . Cholecalciferol (VITAMIN D) 1000 UNITS capsule Take 1,000 Units by mouth daily.   . Coenzyme Q10 (CO Q 10) 100 MG CAPS Take 1 capsule by mouth daily.  . Exenatide ER (BYDUREON BCISE) 2 MG/0.85ML AUIJ Inject 2 mg into the skin once a week.  . fish oil-omega-3 fatty acids 1000 MG capsule Take 3 g by mouth daily.   Marland Kitchen glucose blood test strip 1 each by Other route 2 (two) times daily. Test twice daily  . Insulin Pen Needle (BD PEN NEEDLE NANO U/F) 32G X 4 MM MISC  USE AS DIRECTED WITH BYETTA(TWICE DAILY)  . losartan (COZAAR) 100 MG tablet TAKE 1 TABLET(100 MG) BY MOUTH DAILY  . magnesium gluconate (MAGONATE) 500 MG tablet Take 500 mg by mouth 2 (two) times daily.    . metFORMIN (GLUCOPHAGE) 1000 MG tablet Take 1 tablet (1,000 mg total) by mouth 2 (two) times daily with a meal.  . Multiple Vitamins-Minerals (MULTIVITAMIN WITH MINERALS) tablet Take 1 tablet by  mouth daily.    . pioglitazone (ACTOS) 15 MG tablet TAKE 1 TABLET BY MOUTH  DAILY  . simvastatin (ZOCOR) 20 MG tablet Take 1 tablet (20 mg total) by mouth daily.  Marland Kitchen SYNTHROID 50 MCG tablet Take 1 tablet (50 mcg total) by mouth daily before breakfast.  . vitamin C (ASCORBIC ACID) 500 MG tablet Take 500 mg by mouth daily.    . [DISCONTINUED] Exenatide ER (BYDUREON BCISE) 2 MG/0.85ML AUIJ Inject 2 mg into the skin once a week.  . [DISCONTINUED] metFORMIN (GLUCOPHAGE) 1000 MG tablet TAKE 1 TABLET BY MOUTH TWO  TIMES DAILY WITH MEALS  . [DISCONTINUED] simvastatin (ZOCOR) 20 MG tablet TAKE 1 TABLET BY MOUTH  DAILY  . [DISCONTINUED] SYNTHROID 50 MCG tablet Take 1 tablet (50 mcg total) by mouth daily before breakfast.   No facility-administered encounter medications on file as of 10/08/2018.    No Known Allergies  ROS: no fever, chills, headaches, dizziness, chest pain, shortness of breath, GI or GU complaints, bleeding or bruising. No skin changes.  Moods are good.  No joint pains (just right rib soreness since fall last month) or other concerns.   PHYSICAL EXAM:  BP 124/72   Pulse 72   Ht 4' 11.75" (1.518 m)   Wt 171 lb 9.6 oz (77.8 kg)   BMI 33.79 kg/m   Wt Readings from Last 3 Encounters:  10/08/18 171 lb 9.6 oz (77.8 kg)  04/03/18 172 lb 6.4 oz (78.2 kg)  09/19/17 170 lb (77.1 kg)    Well developed, pleasant female, in good spirits HEENT: conjunctiva and sclera are clear, OP clear Neck: no lymphadenopathy, thyromegaly or mass, no bruit Heart: regular rate and rhythm Lungs: clear bilaterally Chest: slight tenderness at right lower ribs Back: no CVA or spinal tenderness Abdomen: soft, nontender, no organomegaly or mass Extremities: no edema, normal pulses, normal sensation. Skin: Thinning hair on scalp, unchanged. RLE:  0.8 x 0.6 cm hyperpigmented area at right medial mid-upper calf, raised/nodular. This is just posterior to a healed scar.  Multiple WHSS on the right medial  calf Psych: normal mood, affect, hygiene and grooming Neuro: alert and oriented, cranial nerves intact, normal gait  Lab Results  Component Value Date   HGBA1C 6.6 (H) 10/06/2018     Chemistry      Component Value Date/Time   NA 140 10/06/2018 0952   K 4.9 10/06/2018 0952   CL 99 10/06/2018 0952   CO2 25 10/06/2018 0952   BUN 15 10/06/2018 0952   CREATININE 1.01 (H) 10/06/2018 0952   CREATININE 0.99 (H) 09/16/2017 1004      Component Value Date/Time   CALCIUM 9.9 10/06/2018 0952   CALCIUM 9.7 04/23/2012 0915   ALKPHOS 69 10/06/2018 0952   AST 25 10/06/2018 0952   ALT 20 10/06/2018 0952   BILITOT 0.5 10/06/2018 0952     Fasting glucose 125  Lab Results  Component Value Date   CHOL 144 10/06/2018   HDL 79 10/06/2018   LDLCALC 48 10/06/2018   TRIG 85 10/06/2018   CHOLHDL 1.8 10/06/2018  Lab Results  Component Value Date   TSH 1.730 10/06/2018   Urine microalbumin/Cr ratio 200.6 (high)  ASSESSMENT/PLAN:  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  Neuropathy is mild, not requiring treatment - Plan: metFORMIN (GLUCOPHAGE) 1000 MG tablet, Exenatide ER (BYDUREON BCISE) 2 MG/0.85ML AUIJ  Essential hypertension, benign - controlled  Pure hypercholesterolemia - at goal on current treatment, continue - Plan: simvastatin (ZOCOR) 20 MG tablet  Hypothyroidism, unspecified type - adequately replaced on current dose, continue  Microalbuminuria - increasing.   serum Cr stable. BP and diabetes are controlled. Continue Losartan  Class 1 obesity due to excess calories with serious comorbidity and body mass index (BMI) of 33.0 to 33.9 in adult - counseled re: diet, exercise, risks, weight loss  Melanoma of skin ([place]) - f/u as planned in the Spring, sooner if nodule on RLE changes  Rib pain--contusion vs healing fracture.  No further eval/work-up needed. Continue tylenol or heat  prn. F/u if persistent/worsening.   F/u 6 mos AWV/med check with labs prior, sooner prn.  c-met, A1c, CBC

## 2018-10-08 ENCOUNTER — Encounter: Payer: Medicare Other | Admitting: Family Medicine

## 2018-10-08 ENCOUNTER — Encounter: Payer: Self-pay | Admitting: Family Medicine

## 2018-10-08 ENCOUNTER — Ambulatory Visit (INDEPENDENT_AMBULATORY_CARE_PROVIDER_SITE_OTHER): Payer: Medicare Other | Admitting: Family Medicine

## 2018-10-08 VITALS — BP 124/72 | HR 72 | Ht 59.75 in | Wt 171.6 lb

## 2018-10-08 DIAGNOSIS — E1149 Type 2 diabetes mellitus with other diabetic neurological complication: Secondary | ICD-10-CM | POA: Diagnosis not present

## 2018-10-08 DIAGNOSIS — I10 Essential (primary) hypertension: Secondary | ICD-10-CM | POA: Diagnosis not present

## 2018-10-08 DIAGNOSIS — Z5181 Encounter for therapeutic drug level monitoring: Secondary | ICD-10-CM | POA: Diagnosis not present

## 2018-10-08 DIAGNOSIS — E6609 Other obesity due to excess calories: Secondary | ICD-10-CM

## 2018-10-08 DIAGNOSIS — Z6833 Body mass index (BMI) 33.0-33.9, adult: Secondary | ICD-10-CM | POA: Diagnosis not present

## 2018-10-08 DIAGNOSIS — C439 Malignant melanoma of skin, unspecified: Secondary | ICD-10-CM | POA: Diagnosis not present

## 2018-10-08 DIAGNOSIS — R809 Proteinuria, unspecified: Secondary | ICD-10-CM

## 2018-10-08 DIAGNOSIS — E039 Hypothyroidism, unspecified: Secondary | ICD-10-CM | POA: Diagnosis not present

## 2018-10-08 DIAGNOSIS — E7800 Pure hypercholesterolemia, unspecified: Secondary | ICD-10-CM | POA: Diagnosis not present

## 2018-10-08 MED ORDER — METFORMIN HCL 1000 MG PO TABS: 1000.0000 mg | ORAL_TABLET | Freq: Two times a day (BID) | ORAL | 1 refills | Status: DC

## 2018-10-08 MED ORDER — SIMVASTATIN 20 MG PO TABS: 20.0000 mg | ORAL_TABLET | Freq: Every day | ORAL | 1 refills | Status: DC

## 2018-10-08 MED ORDER — EXENATIDE ER 2 MG/0.85ML ~~LOC~~ AUIJ: 2.0000 mg | AUTO-INJECTOR | SUBCUTANEOUS | 5 refills | Status: DC

## 2018-10-08 NOTE — Patient Instructions (Signed)
Continue your current medications. Blood pressure, cholesterol, diabetes and thyroid are well controlled. Still having some leakage of protein in the urine. Work hard to get at least 150 minutes of aerobic activity each week and weight-bearing exercise 2-3 times/week. Continue to work at losing some weight (specifically from your waist).

## 2018-10-09 ENCOUNTER — Encounter: Payer: Self-pay | Admitting: Family Medicine

## 2018-10-20 ENCOUNTER — Telehealth: Payer: Self-pay | Admitting: *Deleted

## 2018-10-20 NOTE — Telephone Encounter (Signed)
May be triggered by allergy, so she can take an allergy medication (such as claritin, allegra, or zyrtec). She can use mucinex DM tablets (or Tussin DM, I believe they make one for diabetics, this is a liquid) also, as needed for cough. If she develops shortness of breath, chest tightness, fever or other worsening symptoms, she should come in.,
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
## 2018-10-20 NOTE — Telephone Encounter (Signed)
Left message for patient

## 2018-12-01 ENCOUNTER — Other Ambulatory Visit: Payer: Self-pay | Admitting: Family Medicine

## 2018-12-01 DIAGNOSIS — I10 Essential (primary) hypertension: Secondary | ICD-10-CM

## 2018-12-07 ENCOUNTER — Other Ambulatory Visit: Payer: Self-pay | Admitting: Family Medicine

## 2018-12-07 DIAGNOSIS — E1149 Type 2 diabetes mellitus with other diabetic neurological complication: Secondary | ICD-10-CM

## 2018-12-08 ENCOUNTER — Other Ambulatory Visit: Payer: Self-pay | Admitting: *Deleted

## 2019-01-26 DIAGNOSIS — R918 Other nonspecific abnormal finding of lung field: Secondary | ICD-10-CM | POA: Diagnosis not present

## 2019-01-26 DIAGNOSIS — C4371 Malignant melanoma of right lower limb, including hip: Secondary | ICD-10-CM | POA: Diagnosis not present

## 2019-01-28 DIAGNOSIS — R2241 Localized swelling, mass and lump, right lower limb: Secondary | ICD-10-CM | POA: Diagnosis not present

## 2019-01-28 DIAGNOSIS — Z85820 Personal history of malignant melanoma of skin: Secondary | ICD-10-CM | POA: Diagnosis not present

## 2019-01-28 DIAGNOSIS — Z08 Encounter for follow-up examination after completed treatment for malignant neoplasm: Secondary | ICD-10-CM | POA: Diagnosis not present

## 2019-03-07 ENCOUNTER — Other Ambulatory Visit: Payer: Self-pay | Admitting: Family Medicine

## 2019-03-07 DIAGNOSIS — E1149 Type 2 diabetes mellitus with other diabetic neurological complication: Secondary | ICD-10-CM

## 2019-03-07 DIAGNOSIS — E7800 Pure hypercholesterolemia, unspecified: Secondary | ICD-10-CM

## 2019-04-29 ENCOUNTER — Other Ambulatory Visit: Payer: Medicare Other

## 2019-05-04 ENCOUNTER — Ambulatory Visit: Payer: Medicare Other | Admitting: Family Medicine

## 2019-05-08 ENCOUNTER — Other Ambulatory Visit: Payer: Self-pay | Admitting: Family Medicine

## 2019-05-08 DIAGNOSIS — E1149 Type 2 diabetes mellitus with other diabetic neurological complication: Secondary | ICD-10-CM

## 2019-05-13 ENCOUNTER — Other Ambulatory Visit: Payer: Self-pay | Admitting: Family Medicine

## 2019-05-13 DIAGNOSIS — E1149 Type 2 diabetes mellitus with other diabetic neurological complication: Secondary | ICD-10-CM

## 2019-05-13 DIAGNOSIS — E7800 Pure hypercholesterolemia, unspecified: Secondary | ICD-10-CM

## 2019-06-03 ENCOUNTER — Other Ambulatory Visit: Payer: Self-pay | Admitting: Family Medicine

## 2019-06-03 DIAGNOSIS — C439 Malignant melanoma of skin, unspecified: Secondary | ICD-10-CM | POA: Diagnosis not present

## 2019-06-03 DIAGNOSIS — I10 Essential (primary) hypertension: Secondary | ICD-10-CM

## 2019-06-03 DIAGNOSIS — R29898 Other symptoms and signs involving the musculoskeletal system: Secondary | ICD-10-CM | POA: Diagnosis not present

## 2019-06-03 DIAGNOSIS — Z85820 Personal history of malignant melanoma of skin: Secondary | ICD-10-CM | POA: Diagnosis not present

## 2019-06-03 DIAGNOSIS — D485 Neoplasm of uncertain behavior of skin: Secondary | ICD-10-CM | POA: Diagnosis not present

## 2019-06-09 DIAGNOSIS — M79651 Pain in right thigh: Secondary | ICD-10-CM | POA: Diagnosis not present

## 2019-06-09 DIAGNOSIS — I890 Lymphedema, not elsewhere classified: Secondary | ICD-10-CM | POA: Diagnosis not present

## 2019-06-09 DIAGNOSIS — R531 Weakness: Secondary | ICD-10-CM | POA: Diagnosis not present

## 2019-06-10 ENCOUNTER — Other Ambulatory Visit: Payer: Self-pay | Admitting: Family Medicine

## 2019-06-10 DIAGNOSIS — E1149 Type 2 diabetes mellitus with other diabetic neurological complication: Secondary | ICD-10-CM

## 2019-06-12 ENCOUNTER — Other Ambulatory Visit: Payer: Self-pay | Admitting: Family Medicine

## 2019-06-12 DIAGNOSIS — E1149 Type 2 diabetes mellitus with other diabetic neurological complication: Secondary | ICD-10-CM

## 2019-06-12 DIAGNOSIS — R531 Weakness: Secondary | ICD-10-CM | POA: Diagnosis not present

## 2019-06-12 DIAGNOSIS — M79651 Pain in right thigh: Secondary | ICD-10-CM | POA: Diagnosis not present

## 2019-06-12 DIAGNOSIS — I890 Lymphedema, not elsewhere classified: Secondary | ICD-10-CM | POA: Diagnosis not present

## 2019-06-15 DIAGNOSIS — R531 Weakness: Secondary | ICD-10-CM | POA: Diagnosis not present

## 2019-06-15 DIAGNOSIS — I890 Lymphedema, not elsewhere classified: Secondary | ICD-10-CM | POA: Diagnosis not present

## 2019-06-15 DIAGNOSIS — M79651 Pain in right thigh: Secondary | ICD-10-CM | POA: Diagnosis not present

## 2019-06-16 DIAGNOSIS — C439 Malignant melanoma of skin, unspecified: Secondary | ICD-10-CM | POA: Diagnosis not present

## 2019-06-16 DIAGNOSIS — C438 Malignant melanoma of overlapping sites of skin: Secondary | ICD-10-CM | POA: Diagnosis not present

## 2019-06-17 ENCOUNTER — Other Ambulatory Visit: Payer: Self-pay | Admitting: Family Medicine

## 2019-06-17 DIAGNOSIS — E119 Type 2 diabetes mellitus without complications: Secondary | ICD-10-CM | POA: Diagnosis not present

## 2019-06-17 DIAGNOSIS — I10 Essential (primary) hypertension: Secondary | ICD-10-CM | POA: Diagnosis not present

## 2019-06-17 DIAGNOSIS — Z79899 Other long term (current) drug therapy: Secondary | ICD-10-CM | POA: Diagnosis not present

## 2019-06-17 DIAGNOSIS — C439 Malignant melanoma of skin, unspecified: Secondary | ICD-10-CM | POA: Diagnosis not present

## 2019-06-17 DIAGNOSIS — E785 Hyperlipidemia, unspecified: Secondary | ICD-10-CM | POA: Diagnosis not present

## 2019-06-17 DIAGNOSIS — R52 Pain, unspecified: Secondary | ICD-10-CM | POA: Diagnosis not present

## 2019-06-17 DIAGNOSIS — Z01812 Encounter for preprocedural laboratory examination: Secondary | ICD-10-CM | POA: Diagnosis not present

## 2019-06-17 DIAGNOSIS — Z6834 Body mass index (BMI) 34.0-34.9, adult: Secondary | ICD-10-CM | POA: Diagnosis not present

## 2019-06-17 DIAGNOSIS — E039 Hypothyroidism, unspecified: Secondary | ICD-10-CM

## 2019-06-17 DIAGNOSIS — C4371 Malignant melanoma of right lower limb, including hip: Secondary | ICD-10-CM | POA: Diagnosis not present

## 2019-06-17 DIAGNOSIS — I440 Atrioventricular block, first degree: Secondary | ICD-10-CM | POA: Diagnosis not present

## 2019-06-17 DIAGNOSIS — Z7984 Long term (current) use of oral hypoglycemic drugs: Secondary | ICD-10-CM | POA: Diagnosis not present

## 2019-06-17 DIAGNOSIS — Z1159 Encounter for screening for other viral diseases: Secondary | ICD-10-CM | POA: Diagnosis not present

## 2019-06-17 DIAGNOSIS — E669 Obesity, unspecified: Secondary | ICD-10-CM | POA: Diagnosis not present

## 2019-06-18 NOTE — Progress Notes (Signed)
//  Patient has been scheduled for labs 06/22/2019.

## 2019-06-19 ENCOUNTER — Telehealth: Payer: Self-pay

## 2019-06-19 DIAGNOSIS — E1149 Type 2 diabetes mellitus with other diabetic neurological complication: Secondary | ICD-10-CM

## 2019-06-19 MED ORDER — BYDUREON BCISE 2 MG/0.85ML ~~LOC~~ AUIJ: 2.0000 mg | AUTO-INJECTOR | SUBCUTANEOUS | 0 refills | Status: DC

## 2019-06-19 NOTE — Telephone Encounter (Signed)
Pt called and stated she needs samples of Bydurium, stated she cannot afford it at this time. Patient was give a total of 3 samples.

## 2019-06-22 ENCOUNTER — Other Ambulatory Visit: Payer: Medicare Other

## 2019-06-23 DIAGNOSIS — C4371 Malignant melanoma of right lower limb, including hip: Secondary | ICD-10-CM | POA: Diagnosis not present

## 2019-06-23 DIAGNOSIS — Z7984 Long term (current) use of oral hypoglycemic drugs: Secondary | ICD-10-CM | POA: Diagnosis not present

## 2019-06-23 DIAGNOSIS — E039 Hypothyroidism, unspecified: Secondary | ICD-10-CM | POA: Diagnosis not present

## 2019-06-23 DIAGNOSIS — C439 Malignant melanoma of skin, unspecified: Secondary | ICD-10-CM | POA: Diagnosis not present

## 2019-06-23 DIAGNOSIS — E119 Type 2 diabetes mellitus without complications: Secondary | ICD-10-CM | POA: Diagnosis not present

## 2019-06-23 DIAGNOSIS — I10 Essential (primary) hypertension: Secondary | ICD-10-CM | POA: Diagnosis not present

## 2019-06-23 DIAGNOSIS — I440 Atrioventricular block, first degree: Secondary | ICD-10-CM | POA: Diagnosis not present

## 2019-06-25 ENCOUNTER — Encounter: Payer: Self-pay | Admitting: Family Medicine

## 2019-06-30 ENCOUNTER — Other Ambulatory Visit: Payer: BLUE CROSS/BLUE SHIELD

## 2019-07-01 DIAGNOSIS — Z48817 Encounter for surgical aftercare following surgery on the skin and subcutaneous tissue: Secondary | ICD-10-CM | POA: Diagnosis not present

## 2019-07-01 DIAGNOSIS — C439 Malignant melanoma of skin, unspecified: Secondary | ICD-10-CM | POA: Diagnosis not present

## 2019-07-02 ENCOUNTER — Other Ambulatory Visit: Payer: Self-pay

## 2019-07-02 ENCOUNTER — Other Ambulatory Visit: Payer: Medicare Other

## 2019-07-02 DIAGNOSIS — E1149 Type 2 diabetes mellitus with other diabetic neurological complication: Secondary | ICD-10-CM | POA: Diagnosis not present

## 2019-07-02 DIAGNOSIS — E039 Hypothyroidism, unspecified: Secondary | ICD-10-CM | POA: Diagnosis not present

## 2019-07-02 DIAGNOSIS — Z5181 Encounter for therapeutic drug level monitoring: Secondary | ICD-10-CM | POA: Diagnosis not present

## 2019-07-02 DIAGNOSIS — I10 Essential (primary) hypertension: Secondary | ICD-10-CM | POA: Diagnosis not present

## 2019-07-02 DIAGNOSIS — C439 Malignant melanoma of skin, unspecified: Secondary | ICD-10-CM | POA: Diagnosis not present

## 2019-07-03 ENCOUNTER — Telehealth: Payer: Self-pay

## 2019-07-03 LAB — COMPREHENSIVE METABOLIC PANEL
ALT: 16 IU/L (ref 0–32)
AST: 25 IU/L (ref 0–40)
Albumin/Globulin Ratio: 1.7 (ref 1.2–2.2)
Albumin: 4.7 g/dL (ref 3.7–4.7)
Alkaline Phosphatase: 60 IU/L (ref 39–117)
BUN/Creatinine Ratio: 14 (ref 12–28)
BUN: 13 mg/dL (ref 8–27)
Bilirubin Total: 0.4 mg/dL (ref 0.0–1.2)
CO2: 26 mmol/L (ref 20–29)
Calcium: 10.2 mg/dL (ref 8.7–10.3)
Chloride: 97 mmol/L (ref 96–106)
Creatinine, Ser: 0.94 mg/dL (ref 0.57–1.00)
GFR calc Af Amer: 66 mL/min/{1.73_m2} (ref >59)
GFR calc non Af Amer: 57 mL/min/{1.73_m2} — ABNORMAL LOW (ref >59)
Globulin, Total: 2.8 g/dL (ref 1.5–4.5)
Glucose: 117 mg/dL — ABNORMAL HIGH (ref 65–99)
Potassium: 4.4 mmol/L (ref 3.5–5.2)
Sodium: 136 mmol/L (ref 134–144)
Total Protein: 7.5 g/dL (ref 6.0–8.5)

## 2019-07-03 LAB — CBC WITH DIFFERENTIAL/PLATELET
Basophils Absolute: 0.1 10*3/uL (ref 0.0–0.2)
Basos: 1 %
EOS (ABSOLUTE): 0.3 10*3/uL (ref 0.0–0.4)
Eos: 4 %
Hematocrit: 38.2 % (ref 34.0–46.6)
Hemoglobin: 12.9 g/dL (ref 11.1–15.9)
Immature Grans (Abs): 0 10*3/uL (ref 0.0–0.1)
Immature Granulocytes: 0 %
Lymphocytes Absolute: 2.1 10*3/uL (ref 0.7–3.1)
Lymphs: 31 %
MCH: 29.7 pg (ref 26.6–33.0)
MCHC: 33.8 g/dL (ref 31.5–35.7)
MCV: 88 fL (ref 79–97)
Monocytes Absolute: 0.6 10*3/uL (ref 0.1–0.9)
Monocytes: 9 %
Neutrophils Absolute: 3.8 10*3/uL (ref 1.4–7.0)
Neutrophils: 55 %
Platelets: 385 10*3/uL (ref 150–450)
RBC: 4.35 x10E6/uL (ref 3.77–5.28)
RDW: 13.5 % (ref 11.7–15.4)
WBC: 6.9 10*3/uL (ref 3.4–10.8)

## 2019-07-03 LAB — TSH: TSH: 1.24 u[IU]/mL (ref 0.450–4.500)

## 2019-07-03 LAB — HEMOGLOBIN A1C
Est. average glucose Bld gHb Est-mCnc: 131 mg/dL
Hgb A1c MFr Bld: 6.2 % — ABNORMAL HIGH (ref 4.8–5.6)

## 2019-07-03 LAB — T4, FREE: Free T4: 1.7 ng/dL (ref 0.82–1.77)

## 2019-07-03 MED ORDER — SYNTHROID 50 MCG PO TABS: 50.0000 ug | ORAL_TABLET | Freq: Every day | ORAL | 0 refills | Status: DC

## 2019-07-03 NOTE — Telephone Encounter (Signed)
Med sent.

## 2019-07-10 DIAGNOSIS — C439 Malignant melanoma of skin, unspecified: Secondary | ICD-10-CM | POA: Diagnosis not present

## 2019-07-10 DIAGNOSIS — Z961 Presence of intraocular lens: Secondary | ICD-10-CM | POA: Diagnosis not present

## 2019-07-10 DIAGNOSIS — R531 Weakness: Secondary | ICD-10-CM | POA: Diagnosis not present

## 2019-07-10 DIAGNOSIS — E119 Type 2 diabetes mellitus without complications: Secondary | ICD-10-CM | POA: Diagnosis not present

## 2019-07-10 DIAGNOSIS — M79651 Pain in right thigh: Secondary | ICD-10-CM | POA: Diagnosis not present

## 2019-07-10 DIAGNOSIS — I890 Lymphedema, not elsewhere classified: Secondary | ICD-10-CM | POA: Diagnosis not present

## 2019-07-13 DIAGNOSIS — E119 Type 2 diabetes mellitus without complications: Secondary | ICD-10-CM | POA: Diagnosis not present

## 2019-07-13 DIAGNOSIS — Z961 Presence of intraocular lens: Secondary | ICD-10-CM | POA: Diagnosis not present

## 2019-07-13 DIAGNOSIS — I890 Lymphedema, not elsewhere classified: Secondary | ICD-10-CM | POA: Diagnosis not present

## 2019-07-13 DIAGNOSIS — M79651 Pain in right thigh: Secondary | ICD-10-CM | POA: Diagnosis not present

## 2019-07-13 DIAGNOSIS — R531 Weakness: Secondary | ICD-10-CM | POA: Diagnosis not present

## 2019-07-13 DIAGNOSIS — C439 Malignant melanoma of skin, unspecified: Secondary | ICD-10-CM | POA: Diagnosis not present

## 2019-07-14 ENCOUNTER — Other Ambulatory Visit: Payer: Self-pay

## 2019-07-18 DIAGNOSIS — I700 Atherosclerosis of aorta: Secondary | ICD-10-CM | POA: Insufficient documentation

## 2019-07-18 NOTE — Progress Notes (Signed)
Chief Complaint  Patient presents with  . Medicare Wellness    nonfasting AWV with pelvic exam. No new concerns.     Tonya Rogers is a 83 y.o. female who presents for Medicare annual wellness visit and follow-up on chronic medical conditions. She had labs done earlier this month.  She has the following concerns:  She is requesting handicap placard--due to pain in her right thigh.  Using a cane now.  H/o Melanoma--under the care of Dr. Clovis Riley at [facility].  She was seen in July, and had additional areas of melanoma removed from the right calf. She underwent wide excision from R medial calf on 06/23/2019. Path report: SKIN, RIGHT LEG, EXCISION:  Residual malignant melanoma, margins free of neoplasm. See comment. COMMENT: The excision shows residual melanoma without overlying epidermal involvement. In the right clinical setting, this could represent a metastatic melanoma. The tumor extends to a maximum depth of 0.85 mm. Margins are free of neoplasm.  PET 06/16/2019:  Result Impression   1. Diffuse thickening and radiotracer uptake within the left Achilles tendon consistent with left Achilles tendinitis. 2. Otherwise, no FDG avid masses/lesions are identified. 3. Ancillary CT findings:  Atherosclerotic calcifications of the thoracic aorta and branch vessels without aneurysmal dilatation. Heavy burden of coronary artery calcifications. Similar distention of the gallbladder without adjacent inflammatory changes. Similar small calcified splenic aneurysm. Tiny nonobstructing bilateral renal calculi versus parenchymal Multilevel degenerative changes in the spine most pronounced in the lower cervical spine where there is at least moderate spinal canal narrowing. Small bone infarct in the distal right femoral diaphysis.  She was complaining of pain in the right calf, and was sent for PT and started on gabapentin '300mg'$  qHS. She reports she has chronic pain inferomedially to the  right knee, since her initial surgery.   She is having pain in the right calf, not bothering her too much, just normal healing, only since surgery.  Her main complaint had been right thigh pain, for which she has been getting PT. She reports this pain is slowly improving.  She took the gabapentin for about 3 weeks, didn't find improvement, so stopped taking it.  She had some drowsiness in the mornings while taking it. She was told she has shortening of the muscle in the left thigh (quads). She is compliant with her home exercises. She does note some improvement with therapy.  Topical measures tried ([employer], [employer], etc) were not helpful.  ZR:AQTMA well on the B-cise pen, in addition to pioglitazone and metformin.Sugars have been running up to 130, usually 11-114 in the mornings (fasting). Doesn't check other times of the day.  Denieshypoglycemia, polydipsia or polyuria. Has some neuropathy--tingling in feet (not in hands), a mild burning, not painful or bothersome, unchanged/chronic. Last eye exam was 04/2018, no retinopathy; she is scheduled for next week. She checks her feet regularly, no sores/concerns.  Hyperlipidemia--She is compliant with her medications, taking '20mg'$  of simvastatin and '3000mg'$  of fish oil. Denies any side effects to the medications, and is following a lowfat, low cholesterol diet.  Lipids were at goal on last check: Lab Results  Component Value Date   CHOL 144 10/06/2018   HDL 79 10/06/2018   LDLCALC 48 10/06/2018   TRIG 85 10/06/2018   CHOLHDL 1.8 10/06/2018   Hypertension follow-up: Blood pressures have been checked at other doctors visits--only high when rushing in, repeat by MD has been "normal". Denies dizziness, headaches, chest pain. Denies side effects of medications. Denies any problems with DOE (  when going to the gym and exerting herself more than she can currently).  Hypothyroidism: She denies any missed pills, taking the branded Synthroid. She has  always been crushing this, mixing it with water, as she had trouble swallowing the pill when she tried swallowing it whole. She has been taking it this way for many years. Denies any change in moods, bowels, temperature intolerance. She has had some issues with nail splitting over the years, butBiotin has helped. Constipation is managed well with prunes. She gained weight--even more, and already has lost about 5#. Free T4 was elevated at 2.5 on labs done at [facility] in July.  This was repeated with labs done earlier this month, see below.  Osteoporosis: She took fosamax x 5 years, stopped in 6761 due to jaw complications. She continues to take regular calcium, Vitamin D, and gets some weight-bearing exercise (up until gyms closed due to pandemic; tries to do some at home now). She had bone density test 4/18. T-2.3 R fem neck. slight improvement, no worsening. Abnl FRAX 22% major osteoporotic fracture, 6.3% hip.  She is due for follow-up bone density test.   Immunization History  Administered Date(s) Administered  . Influenza Split 08/26/2013, 09/08/2014, 08/17/2015  . Influenza, High Dose Seasonal PF 09/12/2017, 08/06/2018  . Influenza-Unspecified 08/20/2016  . Pneumococcal Conjugate-13 08/26/2014  . Pneumococcal Polysaccharide-23 12/27/2000, 07/27/2008, 04/03/2018  . Td 03/26/2005, 11/28/2017  . Tdap 03/05/2012  . Zoster 02/25/2011  . Zoster Recombinat (Shingrix) 02/11/2018, 08/06/2018   Last Pap smear: 10/2013, negative, no high risk HPV present Last mammogram:02/2018 Last colonoscopy: 8/06; negative Cologard 01/2016 Last DEXA:4/2018T-2.3 R fem neck. slight improvement, no worsening. Abnl FRAX 22% major osteoporotic fracture, 6.3% hip Dentist: every 6 months Ophtho: yearlyin June, though now past due, due to COVID, has appt scheduled. Exercise:  limited due to pain in right thigh and healing wound in calf.  Walking some, and doing her PT.  Doing some weight-bearing exercise with 2#  handweights she has. (Prior routine: gym 3-4x/week, treadmill, and a 30 min program with 8-10 machines, and bike, rowing machines. She is there 60-90 minutes, at least half is cardio.)  Other doctors caring for patient include: Dentist: Dr. Oren Binet at [facility] Ophtho: Dr. Gershon Crane ENT: Dr. Benjamine Mola Dr. Dub Am[facility] for her melanoma Dermatologist: Dr. Pearline Cables at [place]: [facility]   Depression screen: negative Fall screen: one in 08/2018 where she dislocated her L 5th finger. Functional Status Screen: notable for occasional stress urinary incontinence, decreased hearing, tinnitus, wears hearing aids. Some trouble walking since surgery and pain in R thigh (getting PT). Mini-Cog screen normal (score of 5) See full screens in epic.  End of Life Discussion: Patient hasa living will and medical power of attorney  Past Medical History:  Diagnosis Date  . Alopecia    anterior hair loss  . Anxiety and depression    related to work--resolved  . Baker's cyst of knee    R popliteal  . Cholelithiasis 12/09   asymptomatic, noticed on CT 12/09  . 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 Diabetes mellitus 2002  . Diabetic neuropathy ([place])   . Diverticulosis of colon   . Hearing loss    hearing aids bilaterally  . Hypertension   . Melanoma ([place]) 7/09   RLE with in-transit mets; s/p hyperthermic limb perfusion chemo and excision; re-excision of recurrence 2011  . Metatarsal fracture 10/2009   L 5th--Dr. Rip Harbour  .  Osteoporosis   . Pulmonary nodules    small, seen on CT 12/09  . Pure hypercholesterolemia     Past Surgical History:  Procedure Laterality Date  . CATARACT EXTRACTION, BILATERAL  01/2011  . excision of melanoma  11/23/08   wide excision of melanoma and R inguinal sentinel LN mapping and  biopsy  . excision of recurrent melanoma  2011, 06/2014, 02/2018   RLE  . TONSILLECTOMY AND ADENOIDECTOMY    .  TYMPANOSTOMY TUBE PLACEMENT  2013   L ear  (Dr. Benjamine Mola)    Social History   Socioeconomic History  . Marital status: Divorced    Spouse name: Not on file  . Number of children: 2  . Years of education: Not on file  . Highest education level: Not on file  Occupational History  . 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 Financial resource strain: Not on file  . Food insecurity    Worry: Not on file    Inability: Not on file  . Transportation needs    Medical: Not on file    Non-medical: Not on file  Tobacco Use  . Smoking status: Never Smoker  . Smokeless tobacco: Never Used  Substance and Sexual Activity  . Alcohol use: Yes    Alcohol/week: 0.0 standard drinks    Comment: 3-4 oz wine  twice a week or less  . Drug use: No  . Sexual activity: Not Currently  Lifestyle  . Physical activity    Days per week: Not on file    Minutes per session: Not on file  . Stress: Not on file  Relationships  . Social Herbalist on phone: Not on file    Gets together: Not on file    Attends religious service: Not on file    Active member of club or organization: Not on file    Attends meetings of clubs or organizations: Not on file    Relationship status: Not on file  . Intimate partner violence    Fear of current or ex partner: Not on file    Emotionally abused: Not on file    Physically abused: Not on file    Forced sexual activity: Not on file  Other Topics Concern  . Not on file  Social History Narrative   Daughter in [place] and a son (used to be in [place], unsure where he is now). 4 grandchildren.  1 great grandson. Sister is in [place].   Lives alone    Family History  Problem Relation Age of Onset  . Hypertension Mother   . Stroke Mother   . Arthritis Father   . Hemophilia Father   . Cancer Other        breast  . Diabetes Neg Hx   . Heart disease Neg Hx     Outpatient Encounter Medications as of 07/20/2019  Medication Sig  .  aspirin 81 MG tablet Take 81 mg by mouth every other day.   Marland Kitchen BIOTIN PO Take 1 tablet by mouth daily.  . Calcium Carbonate-Vitamin D (CALCIUM + D) 600-200 MG-UNIT TABS Take 1 tablet by mouth daily.   . Cholecalciferol (VITAMIN D) 1000 UNITS capsule Take 1,000 Units by mouth daily.   . Coenzyme Q10 (CO Q 10) 100 MG CAPS Take 1 capsule by mouth daily.  . Exenatide ER (BYDUREON BCISE) 2 MG/0.85ML AUIJ Inject 2 mg as directed once a week.  . fish oil-omega-3 fatty acids 1000 MG capsule  Take 3 g by mouth daily.   Marland Kitchen glucose blood test strip 1 each by Other route 2 (two) times daily. Test twice daily  . Insulin Pen Needle (BD PEN NEEDLE NANO U/F) 32G X 4 MM MISC USE AS DIRECTED WITH BYETTA(TWICE DAILY)  . losartan (COZAAR) 100 MG tablet TAKE 1 TABLET(100 MG) BY MOUTH DAILY  . magnesium gluconate (MAGONATE) 500 MG tablet Take 500 mg by mouth 2 (two) times daily.    . metFORMIN (GLUCOPHAGE) 1000 MG tablet TAKE 1 TABLET BY MOUTH TWO  TIMES DAILY WITH A MEAL  . Multiple Vitamins-Minerals (MULTIVITAMIN WITH MINERALS) tablet Take 1 tablet by mouth daily.    . pioglitazone (ACTOS) 15 MG tablet TAKE 1 TABLET BY MOUTH  DAILY  . simvastatin (ZOCOR) 20 MG tablet TAKE 1 TABLET BY MOUTH  DAILY  . SYNTHROID 50 MCG tablet Take 1 tablet (50 mcg total) by mouth daily before breakfast.  . vitamin C (ASCORBIC ACID) 500 MG tablet Take 500 mg by mouth daily.     No facility-administered encounter medications on file as of 07/20/2019.     No Known Allergies   ROS: The patient denies anorexia, fever, headaches, vision changes, ear pain, sore throat, breast concerns, chest pain, palpitations, dizziness, syncope, dyspnea on exertion, cough, swelling, nausea, vomiting, diarrhea, abdominal pain, melena, hematochezia, indigestion/heartburn, hematuria, incontinence (just with cough/sneeze), dysuria,vaginal bleeding, discharge, odor or itch, genital lesions, joint pains, weakness, tremor, suspicious skin lesions, depression,  anxiety, abnormal bleeding/bruising (mild, chronic), or enlarged lymph nodes. Constipation, unchanged/chronic--controlled Hearing loss L>R ear, unchanged. Has hearing aids--not using them; doesn't find them all that helpful. Has f/u with ENT scheduled. +tingling/mild burning in feet, chronic, unchanged. Dry mouth, which affected her teeth.  This is much better now. Still using fluoride trays at night.  Some weight gain noted (but starting to lose some), related to the pandemic, not being able to get to the gym, eating more junk--but reading labels, strict on portions.   PHYSICAL EXAM:  BP 132/80   Pulse 80   Temp (!) 97.1 F (36.2 C) (Other (Comment))   Ht 4' 11.5" (1.511 m)   Wt 176 lb 12.8 oz (80.2 kg)   BMI 35.11 kg/m   Wt Readings from Last 3 Encounters:  07/20/19 176 lb 12.8 oz (80.2 kg)  10/08/18 171 lb 9.6 oz (77.8 kg)  04/03/18 172 lb 6.4 oz (78.2 kg)    General Appearance:   Alert, cooperative, no distress, appears stated age  Head:   Normocephalic, without obvious abnormality, atraumatic. Thinning of hair anteriorly  Eyes:   PERRL, conjunctiva/corneas clear, EOM's intact, fundi benign  Ears:   Normal externally.TM's and EAC's normal  Nose:  Not examined, wearing mask due to COVID-19  Throat:  Not examined, wearing mask due to COVID-19  Neck:  Supple, no lymphadenopathy; thyroid: noenlargement/ tenderness/nodules; no carotid bruit or JVD  Back:  Spine nontender, no curvature, ROM normal, no CVA tenderness  Lungs:   Clear to auscultation bilaterally without wheezes, rales or ronchi; respirations unlabored  Chest Wall:   No tenderness or deformity  Heart:   Regular rate and rhythm, S1 and S2 normal, 2/6 systolic ejection murmur is present; no rub or gallop  Breast Exam:   No tenderness, masses, or nipple discharge or inversion. No axillary lymphadenopathy  Abdomen:   Soft, non-tender, nondistended,  normoactive bowel sounds, no masses, no hepatosplenomegaly.   Genitalia:   Normal external genitalia without lesions, atrophic changes noted. BUS and vagina normal; no cervical  motion tenderness.No abnormal vaginal discharge. Uterus and adnexa not enlarged, nontender, no masses; exam somewhat limited by body habitus. Pap not performed  Rectal:   Normal tone, no masses or tenderness; heme negative stool  Extremities:  No clubbing, cyanosis or edema.  Pulses:  2+ and symmetric all extremities  Skin:  Skin color, texture, turgor normal, no rashes or lesions. Healing incision at right medial calf, 9cm (just medial/posterior to her other well-healed scars).  Normal sensation to monofilament.  Lymph nodes:  Cervical, supraclavicular, and axillary nodes normal  Neurologic:  CNII-XII intact, normal strength, sensation and gait; reflexes 2+ and symmetric throughout   Psych: Normal mood, affect, hygiene and grooming  Diabetic foot exam--normal.   Lab Results  Component Value Date   HGBA1C 6.2 (H) 07/02/2019   Fasting glucose 117    Chemistry      Component Value Date/Time   NA 136 07/02/2019 1115   K 4.4 07/02/2019 1115   CL 97 07/02/2019 1115   CO2 26 07/02/2019 1115   BUN 13 07/02/2019 1115   CREATININE 0.94 07/02/2019 1115   CREATININE 0.99 (H) 09/16/2017 1004      Component Value Date/Time   CALCIUM 10.2 07/02/2019 1115   CALCIUM 9.7 04/23/2012 0915   ALKPHOS 60 07/02/2019 1115   AST 25 07/02/2019 1115   ALT 16 07/02/2019 1115   BILITOT 0.4 07/02/2019 1115     Lab Results  Component Value Date   TSH 1.240 07/02/2019   Free T4 1.7 (WNL)   ASSESSMENT/PLAN:  Medicare annual wellness visit, subsequent  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) - another recent wide excision, clear margins, many recurrences all localized to RLE. Healing well  Osteopenia, unspecified location - counseled re: Ca, D, weight-bearing exercise. DEXA due. Didn't tolerate bisphosphonates  Aortic atherosclerosis ([facility]) - discussed ASA, statin, BP and DM control  Coronary artery calcification - pt is asymtpomatic; discussed risks, symptoms to look out for and contact us for cardiology referral if any sx develop  Screening for colon cancer - due for repeat Cologuard testing - Plan: Cologuard  Need for influenza vaccination - Plan: Flu Vaccine QUAD High Dose(Fluad)  Class 2 severe obesity due to excess calories with serious comorbidity and body mass index (BMI) of 35.0 to 35.9 in adult [facility]) - discussed risks, encouraged weight loss (exercise, healthy diet, portion control)   Discuss atherosclerosis, coronary calcifications  Discussed monthly self breast exams and yearly mammograms; at least 30 minutes of aerobic activity at least 5 days/week and weight-bearing exercise 2x/week; proper sunscreen use reviewed; healthy diet, including goals of calcium and vitamin D intake and alcohol recommendations (less than or equal to 1 drink/day) reviewed; regular seatbelt use; changing batteries in smoke detectors. Immunization recommendations discussed--high dose flu shot given today. Colonoscopy recommendations reviewed, was due 06/2015.Had negative cologard 3//2017, due again now, will refer. DEXA due now--pt advised to contact [facility] and schedule (they will fax order). Pap smears not needed due to age and low risk. Diabetic eye exam is scheduled.  MOST form reviewed and updated, Full Code, Full care.   F/u 6 months, fasting labs prior--A1c, lipid, TSH, c-met, urine microalb/Cr ENTER FUTURE ORDERS    Medicare Attestation I have personally  reviewed: The patient's medical  and social history Their use of alcohol, tobacco or illicit drugs Their current medications and supplements The patient's functional ability including ADLs,fall risks, home safety risks, cognitive, and hearing and visual impairment Diet and physical activities Evidence for depression or mood disorders  The patient's weight, height, BMI, and visual acuity have been recorded in the chart.  I have made referrals, counseling, and provided education to the patient based on review of the above and I have provided the patient with a written personalized care plan for preventive services.

## 2019-07-20 ENCOUNTER — Ambulatory Visit (INDEPENDENT_AMBULATORY_CARE_PROVIDER_SITE_OTHER): Payer: Medicare Other | Admitting: Family Medicine

## 2019-07-20 ENCOUNTER — Encounter: Payer: Self-pay | Admitting: Family Medicine

## 2019-07-20 ENCOUNTER — Other Ambulatory Visit: Payer: Self-pay

## 2019-07-20 VITALS — BP 132/80 | HR 80 | Temp 97.1°F | Ht 59.5 in | Wt 176.8 lb

## 2019-07-20 DIAGNOSIS — I2510 Atherosclerotic heart disease of native coronary artery without angina pectoris: Secondary | ICD-10-CM

## 2019-07-20 DIAGNOSIS — E1149 Type 2 diabetes mellitus with other diabetic neurological complication: Secondary | ICD-10-CM | POA: Diagnosis not present

## 2019-07-20 DIAGNOSIS — Z961 Presence of intraocular lens: Secondary | ICD-10-CM | POA: Diagnosis not present

## 2019-07-20 DIAGNOSIS — M79651 Pain in right thigh: Secondary | ICD-10-CM | POA: Diagnosis not present

## 2019-07-20 DIAGNOSIS — M8580 Other specified disorders of bone density and structure, unspecified site: Secondary | ICD-10-CM | POA: Diagnosis not present

## 2019-07-20 DIAGNOSIS — I890 Lymphedema, not elsewhere classified: Secondary | ICD-10-CM | POA: Diagnosis not present

## 2019-07-20 DIAGNOSIS — E66812 Obesity, class 2: Secondary | ICD-10-CM

## 2019-07-20 DIAGNOSIS — Z1211 Encounter for screening for malignant neoplasm of colon: Secondary | ICD-10-CM

## 2019-07-20 DIAGNOSIS — Z23 Encounter for immunization: Secondary | ICD-10-CM | POA: Diagnosis not present

## 2019-07-20 DIAGNOSIS — C439 Malignant melanoma of skin, unspecified: Secondary | ICD-10-CM

## 2019-07-20 DIAGNOSIS — I700 Atherosclerosis of aorta: Secondary | ICD-10-CM | POA: Diagnosis not present

## 2019-07-20 DIAGNOSIS — Z0000 Encounter for general adult medical examination without abnormal findings: Secondary | ICD-10-CM

## 2019-07-20 DIAGNOSIS — I10 Essential (primary) hypertension: Secondary | ICD-10-CM | POA: Diagnosis not present

## 2019-07-20 DIAGNOSIS — E7800 Pure hypercholesterolemia, unspecified: Secondary | ICD-10-CM | POA: Diagnosis not present

## 2019-07-20 DIAGNOSIS — E039 Hypothyroidism, unspecified: Secondary | ICD-10-CM

## 2019-07-20 DIAGNOSIS — I2584 Coronary atherosclerosis due to calcified coronary lesion: Secondary | ICD-10-CM | POA: Diagnosis not present

## 2019-07-20 DIAGNOSIS — R531 Weakness: Secondary | ICD-10-CM | POA: Diagnosis not present

## 2019-07-20 DIAGNOSIS — Z6835 Body mass index (BMI) 35.0-35.9, adult: Secondary | ICD-10-CM

## 2019-07-20 DIAGNOSIS — E119 Type 2 diabetes mellitus without complications: Secondary | ICD-10-CM | POA: Diagnosis not present

## 2019-07-20 NOTE — Patient Instructions (Addendum)
  HEALTH MAINTENANCE RECOMMENDATIONS:  It is recommended that you get at least 30 minutes of aerobic exercise at least 5 days/week (for weight loss, you may need as much as 60-90 minutes). This can be any activity that gets your heart rate up. This can be divided in 10-15 minute intervals if needed, but try and build up your endurance at least once a week.  Weight bearing exercise is also recommended twice weekly.  Eat a healthy diet with lots of vegetables, fruits and fiber.  "Colorful" foods have a lot of vitamins (ie green vegetables, tomatoes, red peppers, etc).  Limit sweet tea, regular sodas and alcoholic beverages, all of which has a lot of calories and sugar.  Up to 1 alcoholic drink daily may be beneficial for women (unless trying to lose weight, watch sugars).  Drink a lot of water.  Calcium recommendations are 1200-1500 mg daily (1500 mg for postmenopausal women or women without ovaries), and vitamin D 1000 IU daily.  This should be obtained from diet and/or supplements (vitamins), and calcium should not be taken all at once, but in divided doses.  Monthly self breast exams and yearly mammograms for women over the age of 25 is recommended.  Sunscreen of at least SPF 30 should be used on all sun-exposed parts of the skin when outside between the hours of 10 am and 4 pm (not just when at beach or pool, but even with exercise, golf, tennis, and yard work!)  Use a sunscreen that says "broad spectrum" so it covers both UVA and UVB rays, and make sure to reapply every 1-2 hours.  Remember to change the batteries in your smoke detectors when changing your clock times in the spring and fall.  Carbon monoxide detectors are recommended for your home.  Use your seat belt every time you are in a car, and please drive safely and not be distracted with cell phones and texting while driving.   Tonya Rogers , Thank you for taking time to come for your Medicare Wellness Visit. I appreciate your ongoing  commitment to your health goals. Please review the following plan we discussed and let me know if I can assist you in the future.    This is a list of the screening recommended for you and due dates:  Health Maintenance  Topic Date Due  . Complete foot exam   04/04/2019  . Eye exam for diabetics  05/09/2019  . Flu Shot  06/27/2019  . Hemoglobin A1C  01/02/2020  . Tetanus Vaccine  11/29/2027  . DEXA scan (bone density measurement)  Completed  . Pneumonia vaccines  Completed   Foot exam and flu shot were done today. Your eye exam is scheduled--be sure to ask them to send [place] a copy of your diabetic eye exam.  You are due now for your mammogram and your follow-up bone density test.  Please call [facility] and schedule both (they will fax me the order to sign for the bone density test).  Try and get some indoor exercise as we discussed.  Limit the snacks you buy, so they aren't as readily available at home. Keep working on losing weight.

## 2019-07-21 ENCOUNTER — Encounter: Payer: Self-pay | Admitting: Family Medicine

## 2019-07-21 DIAGNOSIS — E119 Type 2 diabetes mellitus without complications: Secondary | ICD-10-CM | POA: Diagnosis not present

## 2019-07-21 DIAGNOSIS — Z961 Presence of intraocular lens: Secondary | ICD-10-CM | POA: Diagnosis not present

## 2019-07-21 LAB — HM DIABETES EYE EXAM

## 2019-07-22 ENCOUNTER — Encounter: Payer: Self-pay | Admitting: *Deleted

## 2019-07-27 DIAGNOSIS — C439 Malignant melanoma of skin, unspecified: Secondary | ICD-10-CM | POA: Diagnosis not present

## 2019-07-27 DIAGNOSIS — I890 Lymphedema, not elsewhere classified: Secondary | ICD-10-CM | POA: Diagnosis not present

## 2019-07-27 DIAGNOSIS — R531 Weakness: Secondary | ICD-10-CM | POA: Diagnosis not present

## 2019-07-27 DIAGNOSIS — Z961 Presence of intraocular lens: Secondary | ICD-10-CM | POA: Diagnosis not present

## 2019-07-27 DIAGNOSIS — M79651 Pain in right thigh: Secondary | ICD-10-CM | POA: Diagnosis not present

## 2019-07-27 DIAGNOSIS — E119 Type 2 diabetes mellitus without complications: Secondary | ICD-10-CM | POA: Diagnosis not present

## 2019-07-30 ENCOUNTER — Other Ambulatory Visit: Payer: Self-pay | Admitting: Family Medicine

## 2019-07-30 DIAGNOSIS — E1149 Type 2 diabetes mellitus with other diabetic neurological complication: Secondary | ICD-10-CM

## 2019-07-30 DIAGNOSIS — E7800 Pure hypercholesterolemia, unspecified: Secondary | ICD-10-CM

## 2019-08-05 ENCOUNTER — Other Ambulatory Visit: Payer: Self-pay | Admitting: Family Medicine

## 2019-08-05 DIAGNOSIS — H903 Sensorineural hearing loss, bilateral: Secondary | ICD-10-CM | POA: Diagnosis not present

## 2019-08-05 DIAGNOSIS — E1149 Type 2 diabetes mellitus with other diabetic neurological complication: Secondary | ICD-10-CM

## 2019-08-05 DIAGNOSIS — H838X3 Other specified diseases of inner ear, bilateral: Secondary | ICD-10-CM | POA: Diagnosis not present

## 2019-08-07 DIAGNOSIS — E119 Type 2 diabetes mellitus without complications: Secondary | ICD-10-CM | POA: Diagnosis not present

## 2019-08-07 DIAGNOSIS — R531 Weakness: Secondary | ICD-10-CM | POA: Diagnosis not present

## 2019-08-07 DIAGNOSIS — M79651 Pain in right thigh: Secondary | ICD-10-CM | POA: Diagnosis not present

## 2019-08-07 DIAGNOSIS — Z961 Presence of intraocular lens: Secondary | ICD-10-CM | POA: Diagnosis not present

## 2019-08-07 DIAGNOSIS — C439 Malignant melanoma of skin, unspecified: Secondary | ICD-10-CM | POA: Diagnosis not present

## 2019-08-07 DIAGNOSIS — I890 Lymphedema, not elsewhere classified: Secondary | ICD-10-CM | POA: Diagnosis not present

## 2019-08-08 ENCOUNTER — Other Ambulatory Visit: Payer: Self-pay | Admitting: Family Medicine

## 2019-08-08 DIAGNOSIS — E1149 Type 2 diabetes mellitus with other diabetic neurological complication: Secondary | ICD-10-CM

## 2019-08-13 DIAGNOSIS — R531 Weakness: Secondary | ICD-10-CM | POA: Diagnosis not present

## 2019-08-13 DIAGNOSIS — M79651 Pain in right thigh: Secondary | ICD-10-CM | POA: Diagnosis not present

## 2019-08-13 DIAGNOSIS — I890 Lymphedema, not elsewhere classified: Secondary | ICD-10-CM | POA: Diagnosis not present

## 2019-08-13 DIAGNOSIS — C439 Malignant melanoma of skin, unspecified: Secondary | ICD-10-CM | POA: Diagnosis not present

## 2019-08-13 DIAGNOSIS — Z961 Presence of intraocular lens: Secondary | ICD-10-CM | POA: Diagnosis not present

## 2019-08-13 DIAGNOSIS — E119 Type 2 diabetes mellitus without complications: Secondary | ICD-10-CM | POA: Diagnosis not present

## 2019-08-18 DIAGNOSIS — L821 Other seborrheic keratosis: Secondary | ICD-10-CM | POA: Diagnosis not present

## 2019-08-18 DIAGNOSIS — Z85820 Personal history of malignant melanoma of skin: Secondary | ICD-10-CM | POA: Diagnosis not present

## 2019-08-18 DIAGNOSIS — L570 Actinic keratosis: Secondary | ICD-10-CM | POA: Diagnosis not present

## 2019-08-18 DIAGNOSIS — L814 Other melanin hyperpigmentation: Secondary | ICD-10-CM | POA: Diagnosis not present

## 2019-08-18 DIAGNOSIS — L819 Disorder of pigmentation, unspecified: Secondary | ICD-10-CM | POA: Diagnosis not present

## 2019-08-20 DIAGNOSIS — R29890 Loss of height: Secondary | ICD-10-CM | POA: Diagnosis not present

## 2019-08-20 DIAGNOSIS — Z1231 Encounter for screening mammogram for malignant neoplasm of breast: Secondary | ICD-10-CM | POA: Diagnosis not present

## 2019-08-20 DIAGNOSIS — M8589 Other specified disorders of bone density and structure, multiple sites: Secondary | ICD-10-CM | POA: Diagnosis not present

## 2019-08-20 DIAGNOSIS — Z85820 Personal history of malignant melanoma of skin: Secondary | ICD-10-CM | POA: Diagnosis not present

## 2019-08-20 LAB — HM DEXA SCAN

## 2019-08-20 LAB — HM MAMMOGRAPHY

## 2019-08-29 ENCOUNTER — Other Ambulatory Visit: Payer: Self-pay | Admitting: Family Medicine

## 2019-08-29 DIAGNOSIS — I10 Essential (primary) hypertension: Secondary | ICD-10-CM

## 2019-09-03 DIAGNOSIS — R531 Weakness: Secondary | ICD-10-CM | POA: Diagnosis not present

## 2019-09-03 DIAGNOSIS — I890 Lymphedema, not elsewhere classified: Secondary | ICD-10-CM | POA: Diagnosis not present

## 2019-09-03 DIAGNOSIS — M79651 Pain in right thigh: Secondary | ICD-10-CM | POA: Diagnosis not present

## 2019-09-29 ENCOUNTER — Other Ambulatory Visit: Payer: Self-pay | Admitting: Family Medicine

## 2019-10-12 ENCOUNTER — Telehealth: Payer: Self-pay | Admitting: *Deleted

## 2019-10-12 NOTE — Telephone Encounter (Signed)
Did we do it for her before?  Looks like we discussed at 06/2019 visit, but I don't see anything scanned in chart. Not sure if I asked her to get from [facility] or not.  Dr. Clovis Riley is the one who has been seeing her for the leg pain, and referred her for PT.  Last PT I can see in [place] was 10/8, so not sure of what the plans are regarding her pain.

## 2019-10-12 NOTE — Telephone Encounter (Signed)
Form in your folder

## 2019-10-12 NOTE — Telephone Encounter (Signed)
done

## 2019-10-12 NOTE — Telephone Encounter (Signed)
Patient still having trouble walking, handicapped placard expired at end of Nov-asking if you can extend (issue a new placard)?

## 2019-10-12 NOTE — Telephone Encounter (Signed)
Spoke with patient and she said that you did the original one in August, not sure why it is not in chart. She was released from PT as she was not getting any worse and was somewhat better. She is scheduled to see Dr. Clovis Riley for follow up 10/28/2019.

## 2019-10-12 NOTE — Telephone Encounter (Signed)
[place] for another 3 months

## 2019-10-12 NOTE — Telephone Encounter (Signed)
Patient advised and she will come pick up.

## 2019-10-19 ENCOUNTER — Telehealth: Payer: Self-pay | Admitting: *Deleted

## 2019-10-19 NOTE — Telephone Encounter (Signed)
Patient called and said that she picked up handicapped placard and she noticed that there was no time in months circled. She wanted to know if you were okay with her circling 4 or 5 months, that would get her through the winter months. Please advise.

## 2019-10-19 NOTE — Telephone Encounter (Signed)
Dr Tomi Bamberger said 3 months-patient informed.

## 2019-10-19 NOTE — Telephone Encounter (Signed)
Do we have a copy of the form we filled out?  I recall that it was VERY hard to read.  I think I saw that it said temporary, I cannot recall if it indicated a time for that.  I was going to recommend 3 months, which is what I put in last note (phone encounter requesting the forms)

## 2019-10-21 ENCOUNTER — Other Ambulatory Visit: Payer: Self-pay

## 2019-10-28 DIAGNOSIS — C439 Malignant melanoma of skin, unspecified: Secondary | ICD-10-CM | POA: Diagnosis not present

## 2019-10-28 DIAGNOSIS — N3289 Other specified disorders of bladder: Secondary | ICD-10-CM | POA: Diagnosis not present

## 2019-10-28 DIAGNOSIS — K5730 Diverticulosis of large intestine without perforation or abscess without bleeding: Secondary | ICD-10-CM | POA: Diagnosis not present

## 2019-10-28 DIAGNOSIS — C4371 Malignant melanoma of right lower limb, including hip: Secondary | ICD-10-CM | POA: Diagnosis not present

## 2019-10-28 DIAGNOSIS — N200 Calculus of kidney: Secondary | ICD-10-CM | POA: Diagnosis not present

## 2019-10-28 DIAGNOSIS — R918 Other nonspecific abnormal finding of lung field: Secondary | ICD-10-CM | POA: Diagnosis not present

## 2019-10-28 DIAGNOSIS — I2510 Atherosclerotic heart disease of native coronary artery without angina pectoris: Secondary | ICD-10-CM | POA: Diagnosis not present

## 2019-11-08 ENCOUNTER — Other Ambulatory Visit: Payer: Self-pay | Admitting: Family Medicine

## 2019-11-08 DIAGNOSIS — E1149 Type 2 diabetes mellitus with other diabetic neurological complication: Secondary | ICD-10-CM

## 2019-12-28 ENCOUNTER — Other Ambulatory Visit: Payer: Self-pay | Admitting: *Deleted

## 2019-12-28 MED ORDER — SYNTHROID 50 MCG PO TABS: 50.0000 ug | ORAL_TABLET | Freq: Every day | ORAL | 0 refills | Status: DC

## 2020-01-13 ENCOUNTER — Other Ambulatory Visit: Payer: Medicare Other

## 2020-01-20 ENCOUNTER — Encounter: Payer: Medicare Other | Admitting: Family Medicine

## 2020-01-29 ENCOUNTER — Other Ambulatory Visit: Payer: Self-pay | Admitting: Family Medicine

## 2020-01-29 DIAGNOSIS — E1149 Type 2 diabetes mellitus with other diabetic neurological complication: Secondary | ICD-10-CM

## 2020-01-29 NOTE — Telephone Encounter (Signed)
LVM for pt to call back and make an appointment with dr Tomi Bamberger. Meds were filled . [place]

## 2020-02-03 ENCOUNTER — Other Ambulatory Visit: Payer: Self-pay | Admitting: *Deleted

## 2020-02-03 DIAGNOSIS — E1149 Type 2 diabetes mellitus with other diabetic neurological complication: Secondary | ICD-10-CM

## 2020-02-03 MED ORDER — BYDUREON BCISE 2 MG/0.85ML ~~LOC~~ AUIJ: 2.0000 mg | AUTO-INJECTOR | SUBCUTANEOUS | 0 refills | Status: DC

## 2020-02-05 ENCOUNTER — Other Ambulatory Visit: Payer: Self-pay | Admitting: Family Medicine

## 2020-02-05 DIAGNOSIS — E1149 Type 2 diabetes mellitus with other diabetic neurological complication: Secondary | ICD-10-CM

## 2020-02-15 ENCOUNTER — Other Ambulatory Visit: Payer: Self-pay

## 2020-02-15 ENCOUNTER — Other Ambulatory Visit: Payer: Medicare Other

## 2020-02-15 DIAGNOSIS — E7800 Pure hypercholesterolemia, unspecified: Secondary | ICD-10-CM | POA: Diagnosis not present

## 2020-02-15 DIAGNOSIS — I700 Atherosclerosis of aorta: Secondary | ICD-10-CM | POA: Diagnosis not present

## 2020-02-15 DIAGNOSIS — E039 Hypothyroidism, unspecified: Secondary | ICD-10-CM

## 2020-02-15 DIAGNOSIS — I2510 Atherosclerotic heart disease of native coronary artery without angina pectoris: Secondary | ICD-10-CM

## 2020-02-15 DIAGNOSIS — E1149 Type 2 diabetes mellitus with other diabetic neurological complication: Secondary | ICD-10-CM

## 2020-02-15 DIAGNOSIS — I2584 Coronary atherosclerosis due to calcified coronary lesion: Secondary | ICD-10-CM

## 2020-02-15 DIAGNOSIS — I10 Essential (primary) hypertension: Secondary | ICD-10-CM

## 2020-02-16 LAB — COMPREHENSIVE METABOLIC PANEL
ALT: 19 IU/L (ref 0–32)
AST: 26 IU/L (ref 0–40)
Albumin/Globulin Ratio: 1.8 (ref 1.2–2.2)
Albumin: 4.9 g/dL — ABNORMAL HIGH (ref 3.6–4.6)
Alkaline Phosphatase: 66 IU/L (ref 39–117)
BUN/Creatinine Ratio: 11 — ABNORMAL LOW (ref 12–28)
BUN: 11 mg/dL (ref 8–27)
Bilirubin Total: 0.4 mg/dL (ref 0.0–1.2)
CO2: 21 mmol/L (ref 20–29)
Calcium: 10.1 mg/dL (ref 8.7–10.3)
Chloride: 97 mmol/L (ref 96–106)
Creatinine, Ser: 0.98 mg/dL (ref 0.57–1.00)
GFR calc Af Amer: 63 mL/min/{1.73_m2} (ref >59)
GFR calc non Af Amer: 54 mL/min/{1.73_m2} — ABNORMAL LOW (ref >59)
Globulin, Total: 2.8 g/dL (ref 1.5–4.5)
Glucose: 119 mg/dL — ABNORMAL HIGH (ref 65–99)
Potassium: 4.3 mmol/L (ref 3.5–5.2)
Sodium: 136 mmol/L (ref 134–144)
Total Protein: 7.7 g/dL (ref 6.0–8.5)

## 2020-02-16 LAB — LIPID PANEL
Chol/HDL Ratio: 2 ratio (ref 0.0–4.4)
Cholesterol, Total: 145 mg/dL (ref 100–199)
HDL: 72 mg/dL (ref >39)
LDL Chol Calc (NIH): 56 mg/dL (ref 0–99)
Triglycerides: 91 mg/dL (ref 0–149)
VLDL Cholesterol Cal: 17 mg/dL (ref 5–40)

## 2020-02-16 LAB — HEMOGLOBIN A1C
Est. average glucose Bld gHb Est-mCnc: 131 mg/dL
Hgb A1c MFr Bld: 6.2 % — ABNORMAL HIGH (ref 4.8–5.6)

## 2020-02-16 LAB — MICROALBUMIN / CREATININE URINE RATIO
Creatinine, Urine: 113.3 mg/dL
Microalb/Creat Ratio: 89 mg/g creat — ABNORMAL HIGH (ref 0–29)
Microalbumin, Urine: 100.3 ug/mL

## 2020-02-16 LAB — TSH: TSH: 3.96 u[IU]/mL (ref 0.450–4.500)

## 2020-02-17 ENCOUNTER — Other Ambulatory Visit: Payer: Self-pay | Admitting: Family Medicine

## 2020-02-17 DIAGNOSIS — E7800 Pure hypercholesterolemia, unspecified: Secondary | ICD-10-CM

## 2020-02-17 DIAGNOSIS — E1149 Type 2 diabetes mellitus with other diabetic neurological complication: Secondary | ICD-10-CM

## 2020-02-17 NOTE — Progress Notes (Signed)
Chief Complaint  Patient presents with  . Diabetes    nonfasting med check. She states that she never recieved her Cologuard kit in the mail.    Patient presents for med check.  Last seen in August.  Denies any concerns. She had labs drawn prior to her visit. See below.  H/o Melanoma--under the care of Dr. Clovis Riley at [facility].  She was seen in December, due to f/u in June with scans prior.  Last time she had additional melanoma removed was in 05/2019 (from R calf).  At her last visit she had been dealing with right thigh pain, for which she had been getting PT. She was prescribed gabapentin, but stopped it after 3 weeks as it wasn't helpful and it made her drowsy in the mornings.  She reports that her thigh pain has improved, still has some, but a much smaller area of pain. She continues to do home exercises. Got handicap placard from Dr. Clovis Riley.  She uses a cane when she is out.  ZO:XWRUE well on the B-cise pen, in addition to pioglitazone and metformin.Sugars have been running107-120's in the morning, doesn't check other times of the day. Denieshypoglycemia, polydipsia or polyuria. Has some neuropathy--tingling in feet (not in hands), a mild burning, not painful or bothersome, unchanged/chronic. Last eye exam was 06/2019, no retinopathy. She checks her feet regularly, no sores/concerns.  Hyperlipidemia--She is compliant with her medications, taking '20mg'$  of simvastatin and '3000mg'$  of fish oil. Denies any side effects to the medications, and is following a lowfat, low cholesterol diet.    Hypertension follow-up: Blood pressures at home are 130's (rarely 140)/80 at home.  Okay at other doctor visits also. Denies dizziness, headaches, chest pain, DOE.Denies side effects of medications. Not getting any regular exercise since the pandemic.  Hypothyroidism: She denies any missed pills, taking the branded Synthroid. She has always been crushing medication, mixing it with water, as she had  trouble swallowing the pill when she tried swallowing it whole. She has been taking it this way for many years. Denies any change in moods, bowels, temperature intolerance. She has had some issues with nail splitting over the years, butBiotin has helped. Dry skin through the winter. Constipation is managed well with prunes.  Osteoporosis: She took fosamax x 5 years, stopped in 4540 due to jaw complications (jaw bones were "breaking apart". She continues to take regular calcium, Vitamin D, and gets some weight-bearing exercise (up until gyms closed due to pandemic; tries to do some at home now but not as regularly). She had bone density test 07/2019. T-2.2 at L femoral neck (decline from -1.9), T-2.2 at R femoral neck. Abnormal FRAX score of 22% major osteoporotic fracture, 6% hip fracture.    PMH, PSH, SH reviewed  Outpatient Encounter Medications as of 02/18/2020  Medication Sig  . aspirin 81 MG tablet Take 81 mg by mouth every other day.   Marland Kitchen BIOTIN PO Take 1 tablet by mouth daily.  . Calcium Carbonate-Vitamin D (CALCIUM + D) 600-200 MG-UNIT TABS Take 1 tablet by mouth daily.   . Cholecalciferol (VITAMIN D) 1000 UNITS capsule Take 1,000 Units by mouth daily.   . Coenzyme Q10 (CO Q 10) 100 MG CAPS Take 1 capsule by mouth daily.  . Exenatide ER (BYDUREON BCISE) 2 MG/0.85ML AUIJ Inject 2 mg into the skin once a week.  . fish oil-omega-3 fatty acids 1000 MG capsule Take 3 g by mouth daily.   Marland Kitchen glucose blood test strip 1 each by Other  route 2 (two) times daily. Test twice daily  . Insulin Pen Needle (BD PEN NEEDLE NANO U/F) 32G X 4 MM MISC USE AS DIRECTED WITH BYETTA(TWICE DAILY)  . losartan (COZAAR) 100 MG tablet TAKE 1 TABLET(100 MG) BY MOUTH DAILY  . magnesium gluconate (MAGONATE) 500 MG tablet Take 500 mg by mouth 2 (two) times daily.    . metFORMIN (GLUCOPHAGE) 1000 MG tablet TAKE 1 TABLET BY MOUTH TWO  TIMES DAILY WITH A MEAL  . Multiple Vitamins-Minerals (MULTIVITAMIN WITH MINERALS) tablet  Take 1 tablet by mouth daily.    . pioglitazone (ACTOS) 15 MG tablet TAKE 1 TABLET BY MOUTH  DAILY  . simvastatin (ZOCOR) 20 MG tablet TAKE 1 TABLET BY MOUTH  DAILY  . SYNTHROID 50 MCG tablet Take 1 tablet (50 mcg total) by mouth daily before breakfast.  . vitamin C (ASCORBIC ACID) 500 MG tablet Take 500 mg by mouth daily.     No facility-administered encounter medications on file as of 02/18/2020.   No Known Allergies  ROS: no headaches, dizziness, URI symptoms, fever, chills. No syncope. No chest pain, palpitations, shortness of breath.  No GI complaints or GU complaints. No easy bleeding, bruising, rash or mole changes. No changes to energy, hair/skin/nails, bowels. Thigh pain on R has improved some (smaller area in the thigh, and intermittent). Walks with a cane when outside of her home.   PHYSICAL EXAM:  BP 130/60   Pulse 84   Temp (!) 97.1 F (36.2 C) (Other (Comment))   Ht 4' 11.5" (1.511 m)   Wt 181 lb 6.4 oz (82.3 kg)   BMI 36.03 kg/m   Wt Readings from Last 3 Encounters:  07/20/19 176 lb 12.8 oz (80.2 kg)  10/08/18 171 lb 9.6 oz (77.8 kg)  04/03/18 172 lb 6.4 oz (78.2 kg)   Well developed, pleasant female, in good spirits HEENT: conjunctiva and sclera are clear, EOMI. OP: on lower jaw there is torus--normal in appearance on the right, but irregular and flattened on the left (where she reported having problems related to fosamax in the past).  Neck: no lymphadenopathy, thyromegaly or mass, no bruit Heart: regular rate and rhythm, murmur at RUSB unchanged Lungs: clear bilaterally Back: no CVA or spinal tenderness Abdomen: soft, nontender, no organomegaly or mass Extremities:noedema, normal pulses Skin: Thinning hair on scalp, unchanged. Abdominal skin normal--no bruising, rare small nodule at prior injection sites, nontender Psych: normal mood, affect, hygiene and grooming Neuro: alert and oriented, normal gait  Lab Results  Component Value Date   HGBA1C 6.2 (H)  02/15/2020   Fasting glucose 119    Chemistry      Component Value Date/Time   NA 136 02/15/2020 1024   K 4.3 02/15/2020 1024   CL 97 02/15/2020 1024   CO2 21 02/15/2020 1024   BUN 11 02/15/2020 1024   CREATININE 0.98 02/15/2020 1024   CREATININE 0.99 (H) 09/16/2017 1004      Component Value Date/Time   CALCIUM 10.1 02/15/2020 1024   CALCIUM 9.7 04/23/2012 0915   ALKPHOS 66 02/15/2020 1024   AST 26 02/15/2020 1024   ALT 19 02/15/2020 1024   BILITOT 0.4 02/15/2020 1024     Lab Results  Component Value Date   CHOL 145 02/15/2020   HDL 72 02/15/2020   LDLCALC 56 02/15/2020   TRIG 91 02/15/2020   CHOLHDL 2.0 02/15/2020    Urine microalb/Cr ratio of 89 (elevated, but down from 200 on last check).  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  Neuropathy is mild, not requiring treatment - Plan: metFORMIN (GLUCOPHAGE) 1000 MG tablet, pioglitazone (ACTOS) 15 MG tablet, Exenatide ER (BYDUREON BCISE) 2 MG/0.85ML AUIJ, DISCONTINUED: Exenatide ER (BYDUREON BCISE) 2 MG/0.85ML AUIJ  Hypothyroidism, unspecified type - adequately replaced on current dose, continue  Aortic atherosclerosis ([facility]) - cont ASA, statin  Melanoma of skin ([place]) - f/u as scheduled in June with Dr. Clovis Riley with scans  Microalbuminuria  Osteopenia with high risk of fracture - h/o osteoporosis, currently with osteopenia. Bisphosphonates and Prolia aren't good options. Discussed Evista vs recheck 2 years.Ca, D, Wt-bearing exercise  Essential hypertension, benign - well controlled - Plan: losartan (COZAAR) 100 MG tablet  Pure hypercholesterolemia - at goal on current treatment, continue - Plan: simvastatin (ZOCOR) 20 MG tablet  Colon cancer screening - she reports never getting Cologuard kit.  Will re-fax order  6 month AWV/med  check with labs prior A1c, c-met, CBC, TSH  Osteopenia with high risk of fracture and abnormal FRAX Didn't tolerate alendronate in 2011 due to significant jaw problems, therefore Prolia not likely a good treatment option either.  Discussed Evista, will let her research and decide if she wants to try this, vs recheck DEXA 07/2021. Discussed Ca, D and weight-bearing exercise.  35-40 min FTF, with at least 10-15 min additional time in chart review and documentation.  Please try and get regular exercise, even if done safely in your home (vs a silver sneakers class elsewhere).  Goal is 30 minutes a day at least 5 days/week.  It can be in 10-15 minute intervals. Weight-bearing exercise (upper and lower body) is recommended 2- 3 times/week (2-3 pound weight)  We discussed the thinning of your bones, which has declined some in the last 2 years.  Be sure to get your weight-bearing exercise, as well as at least 1200 mg of calcium (combined from your diet and vitamins, TOTAL amount per day). We discussed the possibility of using Raloxifene (Evista) to help strengthen the bones, since fosamax and Prolia aren't good options given your jaw issues in the past.  If your bone density declines further (to a T score of -2.5, which is then considered osteoporosis), then other newer medications might be an option, but I don't think we are there yet.

## 2020-02-18 ENCOUNTER — Ambulatory Visit (INDEPENDENT_AMBULATORY_CARE_PROVIDER_SITE_OTHER): Payer: Medicare Other | Admitting: Family Medicine

## 2020-02-18 ENCOUNTER — Other Ambulatory Visit: Payer: Self-pay

## 2020-02-18 ENCOUNTER — Encounter: Payer: Self-pay | Admitting: Family Medicine

## 2020-02-18 VITALS — BP 130/60 | HR 84 | Temp 97.1°F | Ht 59.5 in | Wt 181.4 lb

## 2020-02-18 DIAGNOSIS — R809 Proteinuria, unspecified: Secondary | ICD-10-CM

## 2020-02-18 DIAGNOSIS — E7800 Pure hypercholesterolemia, unspecified: Secondary | ICD-10-CM | POA: Diagnosis not present

## 2020-02-18 DIAGNOSIS — I10 Essential (primary) hypertension: Secondary | ICD-10-CM | POA: Diagnosis not present

## 2020-02-18 DIAGNOSIS — M8580 Other specified disorders of bone density and structure, unspecified site: Secondary | ICD-10-CM | POA: Diagnosis not present

## 2020-02-18 DIAGNOSIS — Z5181 Encounter for therapeutic drug level monitoring: Secondary | ICD-10-CM | POA: Diagnosis not present

## 2020-02-18 DIAGNOSIS — Z1211 Encounter for screening for malignant neoplasm of colon: Secondary | ICD-10-CM

## 2020-02-18 DIAGNOSIS — E1149 Type 2 diabetes mellitus with other diabetic neurological complication: Secondary | ICD-10-CM

## 2020-02-18 DIAGNOSIS — I700 Atherosclerosis of aorta: Secondary | ICD-10-CM | POA: Diagnosis not present

## 2020-02-18 DIAGNOSIS — E039 Hypothyroidism, unspecified: Secondary | ICD-10-CM | POA: Diagnosis not present

## 2020-02-18 DIAGNOSIS — C439 Malignant melanoma of skin, unspecified: Secondary | ICD-10-CM | POA: Diagnosis not present

## 2020-02-18 MED ORDER — BYDUREON BCISE 2 MG/0.85ML ~~LOC~~ AUIJ: 2.0000 mg | AUTO-INJECTOR | SUBCUTANEOUS | 5 refills | Status: DC

## 2020-02-18 MED ORDER — METFORMIN HCL 1000 MG PO TABS: ORAL_TABLET | ORAL | 1 refills | Status: DC

## 2020-02-18 MED ORDER — LOSARTAN POTASSIUM 100 MG PO TABS: ORAL_TABLET | ORAL | 3 refills | Status: DC

## 2020-02-18 MED ORDER — SIMVASTATIN 20 MG PO TABS: 20.0000 mg | ORAL_TABLET | Freq: Every day | ORAL | 3 refills | Status: DC

## 2020-02-18 MED ORDER — BYDUREON BCISE 2 MG/0.85ML ~~LOC~~ AUIJ: 2.0000 mg | AUTO-INJECTOR | SUBCUTANEOUS | 0 refills | Status: DC

## 2020-02-18 MED ORDER — PIOGLITAZONE HCL 15 MG PO TABS: 15.0000 mg | ORAL_TABLET | Freq: Every day | ORAL | 1 refills | Status: DC

## 2020-02-18 NOTE — Patient Instructions (Signed)
Please try and get regular exercise, even if done safely in your home (vs a silver sneakers class elsewhere).  Goal is 30 minutes a day at least 5 days/week.  It can be in 10-15 minute intervals. Weight-bearing exercise (upper and lower body) is recommended 2- 3 times/week (2-3 pound weight)  We discussed the thinning of your bones, which has declined some in the last 2 years.  Be sure to get your weight-bearing exercise, as well as at least 1200 mg of calcium (combined from your diet and vitamins, TOTAL amount per day). We discussed the possibility of using Raloxifene (Evista) to help strengthen the bones, since fosamax and Prolia aren't good options given your jaw issues in the past.  If your bone density declines further (to a T score of -2.5, which is then considered osteoporosis), then other newer medications might be an option, but I don't think we are there yet. Below is some information on Evista.  Let us know if you would be interested in trying this, vs just repeating your bone density test in 07/2021.    Osteopenia  Osteopenia is a loss of thickness (density) inside of the bones. Another name for osteopenia is low bone mass. Mild osteopenia is a normal part of aging. It is not a disease, and it does not cause symptoms. However, if you have osteopenia and continue to lose bone mass, you could develop a condition that causes the bones to become thin and break more easily (osteoporosis). You may also lose some height, have back pain, and have a stooped posture. Although osteopenia is not a disease, making changes to your lifestyle and diet can help to prevent osteopenia from developing into osteoporosis. What are the causes? Osteopenia is caused by loss of calcium in the bones.  Bones are constantly changing. Old bone cells are continually being replaced with new bone cells. This process builds new bone. The mineral calcium is needed to build new bone and maintain bone density. Bone density is  usually highest around age 10. After that, most people's bodies cannot replace all the bone they have lost with new bone. What increases the risk? You are more likely to develop this condition if:  You are older than age 31.  You are a woman who went through menopause early.  You have a long illness that keeps you in bed.  You do not get enough exercise.  You lack certain nutrients (malnutrition).  You have an overactive thyroid gland (hyperthyroidism).  You smoke.  You drink a lot of alcohol.  You are taking medicines that weaken the bones, such as steroids. What are the signs or symptoms? This condition does not cause any symptoms. You may have a slightly higher risk for bone breaks (fractures), so getting fractures more easily than normal may be an indication of osteopenia. How is this diagnosed? Your health care provider can diagnose this condition with a special type of X-ray exam that measures bone density (dual-energy X-ray absorptiometry, DEXA). This test can measure bone density in your hips, spine, and wrists. Osteopenia has no symptoms, so this condition is usually diagnosed after a routine bone density screening test is done for osteoporosis. This routine screening is usually done for:  Women who are age 78 or older.  Men who are age 44 or older. If you have risk factors for osteopenia, you may have the screening test at an earlier age. How is this treated? Making dietary and lifestyle changes can lower your risk for osteoporosis.  If you have severe osteopenia that is close to becoming osteoporosis, your health care provider may prescribe medicines and dietary supplements such as calcium and vitamin D. These supplements help to rebuild bone density. Follow these instructions at home:   Take over-the-counter and prescription medicines only as told by your health care provider. These include vitamins and supplements.  Eat a diet that is high in calcium and vitamin  D. ? Calcium is found in dairy products, beans, salmon, and leafy green vegetables like spinach and broccoli. ? Look for foods that have vitamin D and calcium added to them (fortified foods), such as orange juice, cereal, and bread.  Do 30 or more minutes of a weight-bearing exercise every day, such as walking, jogging, or playing a sport. These types of exercises strengthen the bones.  Take precautions at home to lower your risk of falling, such as: ? Keeping rooms well-lit and free of clutter, such as cords. ? Installing safety rails on stairs. ? Using rubber mats in the bathroom or other areas that are often wet or slippery.  Do not use any products that contain nicotine or tobacco, such as cigarettes and e-cigarettes. If you need help quitting, ask your health care provider.  Avoid alcohol or limit alcohol intake to no more than 1 drink a day for nonpregnant women and 2 drinks a day for men. One drink equals 12 oz of beer, 5 oz of wine, or 1 oz of hard liquor.  Keep all follow-up visits as told by your health care provider. This is important. Contact a health care provider if:  You have not had a bone density screening for osteoporosis and you are: ? A woman, age 59 or older. ? A man, age 34 or older.  You are a postmenopausal woman who has not had a bone density screening for osteoporosis.  You are older than age 78 and you want to know if you should have bone density screening for osteoporosis. Summary  Osteopenia is a loss of thickness (density) inside of the bones. Another name for osteopenia is low bone mass.  Osteopenia is not a disease, but it may increase your risk for a condition that causes the bones to become thin and break more easily (osteoporosis).  You may be at risk for osteopenia if you are older than age 67 or if you are a woman who went through early menopause.  Osteopenia does not cause any symptoms, but it can be diagnosed with a bone density screening  test.  Dietary and lifestyle changes are the first treatment for osteopenia. These may lower your risk for osteoporosis. This information is not intended to replace advice given to you by your health care provider. Make sure you discuss any questions you have with your health care provider. Document Revised: 10/25/2017 Document Reviewed: 08/21/2017 Elsevier Patient Education  [place].  Raloxifene tablets What is this medicine? RALOXIFENE (ral OX i feen) reduces the amount of calcium lost from bones. It is used to treat and prevent osteoporosis in women who have experienced menopause. It may also help prevent invasive breast cancer in certain women who have a high risk for breast cancer. This medicine may be used for other purposes; ask your health care provider or pharmacist if you have questions. COMMON BRAND NAME(S): Evista What should I tell my health care provider before I take this medicine? They need to know if you have any of these conditions:  a history of blood clots  cancer  heart disease or recent heart attack  high levels of triglycerides (blood fat) in the blood  history of stroke  kidney disease  liver disease  premenopausal  smoke tobacco  an unusual or allergic reaction to raloxifene, other medicines, foods, dyes, or preservatives  pregnant or trying to get pregnant  breast-feeding How should I use this medicine? Take this medicine by mouth with a glass of water. Follow the directions on the prescription label. The tablets can be taken with or without food. Take your doses at regular intervals. Do not take your medicine more often than directed. A special MedGuide will be given to you by the pharmacist with each prescription and refill. Be sure to read this information carefully each time. Talk to your pediatrician regarding the use of this medicine in children. Special care may be needed. Overdosage: If you think you have taken too much of this  medicine contact a poison control center or emergency room at once. NOTE: This medicine is only for you. Do not share this medicine with others. What if I miss a dose? If you miss a dose, take it as soon as you can. If it is almost time for your next dose, take only that dose. Do not take double or extra doses. What may interact with this medicine?  cholestyramine  female hormones, like estrogens  warfarin This list may not describe all possible interactions. Give your health care provider a list of all the medicines, herbs, non-prescription drugs, or dietary supplements you use. Also tell them if you smoke, drink alcohol, or use illegal drugs. Some items may interact with your medicine. What should I watch for while using this medicine? Visit your doctor or health care professional for regular checks on your progress. Do not stop taking this medicine except on the advice of your doctor or health care professional. If you are taking this medicine to reduce your risk of getting breast cancer, you should know that this medicine does not prevent all types of breast cancer. Talk to your doctor if you have questions. This medicine does not prevent hot flashes. It may cause hot flashes in some patients at the start of therapy. You should make sure that you get enough calcium and vitamin D while you are taking this medicine. Discuss the foods you eat and the vitamins you take with your health care professional. Exercise may help to prevent bone loss. Discuss your exercise needs with your doctor or health care professional. This medicine can rarely cause blood clots. If you are going to have surgery, tell your doctor or health care professional that you are taking this medicine. This medicine should be stopped at least 3 days before surgery. After surgery, it should be restarted only after you are walking again. It should not be restarted while you still need long periods of bed rest. You should not smoke  while taking this medicine. Smoking may increase your risk of blood clots or stroke. If you have any reason to think you are pregnant; stop taking this medicine at once and contact your doctor or health care professional. Do not breast feed while taking this medicine. What side effects may I notice from receiving this medicine? Side effects that you should report to your doctor or health care professional as soon as possible:  allergic reactions like skin rash, itching or hives, swelling of the face, lips, or tongue)  breast tissue changes or discharge  signs and symptoms of a blood clot such as breathing  problems; changes in vision; chest pain; severe, sudden headache; pain, swelling, warmth in the leg; trouble speaking; sudden numbness or weakness of the face, arm or leg  signs and symptoms of a stroke like changes in vision; confusion; trouble speaking or understanding; severe headaches; sudden numbness or weakness of the face, arm or leg; trouble walking; dizziness; loss of balance or coordination  vaginal discharge that is bloody, brown, or rust Side effects that usually do not require medical attention (report to your doctor or health care professional if they continue or are bothersome):  hot flashes  joint pain  leg cramps  sweating  swelling of the ankles, feet, hands This list may not describe all possible side effects. Call your doctor for medical advice about side effects. You may report side effects to FDA at 1-800-FDA-1088. Where should I keep my medicine? Keep out of the reach of children. Store at room temperature between 15 and 30 degrees C (59 and 86 degrees F). Throw away any unused medicine after the expiration date. NOTE: This sheet is a summary. It may not cover all possible information. If you have questions about this medicine, talk to your doctor, pharmacist, or health care provider.  2020 Elsevier/Gold Standard (2016-12-19 17:15:34)

## 2020-02-29 DIAGNOSIS — L821 Other seborrheic keratosis: Secondary | ICD-10-CM | POA: Diagnosis not present

## 2020-02-29 DIAGNOSIS — L814 Other melanin hyperpigmentation: Secondary | ICD-10-CM | POA: Diagnosis not present

## 2020-02-29 DIAGNOSIS — L819 Disorder of pigmentation, unspecified: Secondary | ICD-10-CM | POA: Diagnosis not present

## 2020-02-29 DIAGNOSIS — Z85820 Personal history of malignant melanoma of skin: Secondary | ICD-10-CM | POA: Diagnosis not present

## 2020-02-29 DIAGNOSIS — D229 Melanocytic nevi, unspecified: Secondary | ICD-10-CM | POA: Diagnosis not present

## 2020-02-29 DIAGNOSIS — L905 Scar conditions and fibrosis of skin: Secondary | ICD-10-CM | POA: Diagnosis not present

## 2020-03-16 DIAGNOSIS — Z1211 Encounter for screening for malignant neoplasm of colon: Secondary | ICD-10-CM | POA: Diagnosis not present

## 2020-03-23 LAB — COLOGUARD: Cologuard: NEGATIVE

## 2020-04-22 DIAGNOSIS — C439 Malignant melanoma of skin, unspecified: Secondary | ICD-10-CM | POA: Diagnosis not present

## 2020-04-22 DIAGNOSIS — R918 Other nonspecific abnormal finding of lung field: Secondary | ICD-10-CM | POA: Diagnosis not present

## 2020-04-22 DIAGNOSIS — C4371 Malignant melanoma of right lower limb, including hip: Secondary | ICD-10-CM | POA: Diagnosis not present

## 2020-04-27 DIAGNOSIS — C439 Malignant melanoma of skin, unspecified: Secondary | ICD-10-CM | POA: Diagnosis not present

## 2020-06-09 ENCOUNTER — Other Ambulatory Visit: Payer: Self-pay | Admitting: Family Medicine

## 2020-06-22 ENCOUNTER — Other Ambulatory Visit: Payer: Self-pay | Admitting: Family Medicine

## 2020-06-22 DIAGNOSIS — E1149 Type 2 diabetes mellitus with other diabetic neurological complication: Secondary | ICD-10-CM

## 2020-07-20 DIAGNOSIS — Z961 Presence of intraocular lens: Secondary | ICD-10-CM | POA: Diagnosis not present

## 2020-07-20 DIAGNOSIS — E119 Type 2 diabetes mellitus without complications: Secondary | ICD-10-CM | POA: Diagnosis not present

## 2020-07-20 LAB — HM DIABETES EYE EXAM

## 2020-08-13 DIAGNOSIS — Z23 Encounter for immunization: Secondary | ICD-10-CM | POA: Diagnosis not present

## 2020-08-29 ENCOUNTER — Other Ambulatory Visit: Payer: Medicare Other

## 2020-08-29 ENCOUNTER — Other Ambulatory Visit: Payer: Self-pay

## 2020-08-29 DIAGNOSIS — E039 Hypothyroidism, unspecified: Secondary | ICD-10-CM | POA: Diagnosis not present

## 2020-08-29 DIAGNOSIS — Z5181 Encounter for therapeutic drug level monitoring: Secondary | ICD-10-CM | POA: Diagnosis not present

## 2020-08-29 DIAGNOSIS — E1149 Type 2 diabetes mellitus with other diabetic neurological complication: Secondary | ICD-10-CM

## 2020-08-29 DIAGNOSIS — I10 Essential (primary) hypertension: Secondary | ICD-10-CM | POA: Diagnosis not present

## 2020-08-30 LAB — COMPREHENSIVE METABOLIC PANEL
ALT: 18 IU/L (ref 0–32)
AST: 24 IU/L (ref 0–40)
Albumin/Globulin Ratio: 1.7 (ref 1.2–2.2)
Albumin: 4.6 g/dL (ref 3.6–4.6)
Alkaline Phosphatase: 51 IU/L (ref 44–121)
BUN/Creatinine Ratio: 14 (ref 12–28)
BUN: 15 mg/dL (ref 8–27)
Bilirubin Total: 0.4 mg/dL (ref 0.0–1.2)
CO2: 24 mmol/L (ref 20–29)
Calcium: 10.3 mg/dL (ref 8.7–10.3)
Chloride: 98 mmol/L (ref 96–106)
Creatinine, Ser: 1.05 mg/dL — ABNORMAL HIGH (ref 0.57–1.00)
GFR calc Af Amer: 57 mL/min/{1.73_m2} — ABNORMAL LOW (ref >59)
GFR calc non Af Amer: 50 mL/min/{1.73_m2} — ABNORMAL LOW (ref >59)
Globulin, Total: 2.7 g/dL (ref 1.5–4.5)
Glucose: 127 mg/dL — ABNORMAL HIGH (ref 65–99)
Potassium: 4.6 mmol/L (ref 3.5–5.2)
Sodium: 135 mmol/L (ref 134–144)
Total Protein: 7.3 g/dL (ref 6.0–8.5)

## 2020-08-30 LAB — CBC WITH DIFFERENTIAL/PLATELET
Basophils Absolute: 0.1 10*3/uL (ref 0.0–0.2)
Basos: 1 %
EOS (ABSOLUTE): 0.3 10*3/uL (ref 0.0–0.4)
Eos: 4 %
Hematocrit: 38 % (ref 34.0–46.6)
Hemoglobin: 12.4 g/dL (ref 11.1–15.9)
Immature Grans (Abs): 0 10*3/uL (ref 0.0–0.1)
Immature Granulocytes: 0 %
Lymphocytes Absolute: 2.2 10*3/uL (ref 0.7–3.1)
Lymphs: 32 %
MCH: 25.5 pg — ABNORMAL LOW (ref 26.6–33.0)
MCHC: 32.6 g/dL (ref 31.5–35.7)
MCV: 78 fL — ABNORMAL LOW (ref 79–97)
Monocytes Absolute: 0.7 10*3/uL (ref 0.1–0.9)
Monocytes: 11 %
Neutrophils Absolute: 3.5 10*3/uL (ref 1.4–7.0)
Neutrophils: 52 %
Platelets: 390 10*3/uL (ref 150–450)
RBC: 4.87 x10E6/uL (ref 3.77–5.28)
RDW: 17.1 % — ABNORMAL HIGH (ref 11.7–15.4)
WBC: 6.6 10*3/uL (ref 3.4–10.8)

## 2020-08-30 LAB — HEMOGLOBIN A1C
Est. average glucose Bld gHb Est-mCnc: 126 mg/dL
Hgb A1c MFr Bld: 6 % — ABNORMAL HIGH (ref 4.8–5.6)

## 2020-08-30 LAB — TSH: TSH: 3.59 u[IU]/mL (ref 0.450–4.500)

## 2020-08-31 NOTE — Progress Notes (Signed)
Chief Complaint  Patient presents with   Medicare Wellness    nonfasting AWV, labs already done. No new concerns.    Immunizations    had flu shot 9/18 but prefers to wait about 2 more weeks for COVID booster.    Tonya Rogers is a 84 y.o. female who presents for annual wellness visit and follow-up on chronic medical conditions.  She had labs done prior to her visit, see below.  H/o Melanoma--under the care of Dr. Clovis Rogers at [facility]. She was seen in June, and scans prior to that visit showed no evidence of metastases.  Last time she had additional melanoma removed was in 05/2019 (from R calf). She has noted a couple of spots "pop up" recently, which she plans to show Dr. Clovis Rogers.  At her last visit she had been dealing with right thigh pain, for which she had been getting PT. She was prescribed gabapentin, but stopped it after 3 weeks as it wasn't helpful and it made her drowsy in the mornings.  This thigh pain has improved, comes and goes, and is "minor". She continues to do home exercises. She uses a cane when she is out. (Got handicap placard from Dr. Clovis Rogers.)  Tonya Rogers well on the B-cise pen, in addition to pioglitazone and metformin. Sugars have been running102-116 fasting, later in the day it can be up to 130, only rarely higher. Denieshypoglycemia, polydipsia or polyuria. Has some neuropathy--tingling in feet (not in hands), a mild burning, not painful or bothersome, unchanged/chronic. Last eye exam was 06/2020, no retinopathy. She checks her feet regularly, no sores/concerns.  Hyperlipidemia--She is compliant with her medications, taking 1m of simvastatin and 30041mof fish oil. Denies any side effects to the medications, and is following a lowfat, low cholesterol diet.Lipids were at goal on last check. Lab Results  Component Value Date   CHOL 145 02/15/2020   HDL 72 02/15/2020   LDLCALC 56 02/15/2020   TRIG 91 02/15/2020   CHOLHDL 2.0 02/15/2020   Hypertension  follow-up: Blood pressures at home are 110-145/72-80's.   Denies dizziness, headaches, chest pain, DOE, edema.Denies side effects of medications.  Hypothyroidism: She denies any missed pills, taking the branded Synthroid. She has always been crushing medication, mixing it with water, as she had trouble swallowing the pill when she tried swallowing it whole. She has been taking it this way for many years. Denies any change in moods, bowels, temperature intolerance. She has had some issues with nail splitting over the years, butBiotin has helped. Dry skin mainly in winter. Constipation is managed well with prunes.  Osteoporosis: She took fosamax x 5 years, stopped in [id number] to jaw complications (jaw bones were "breaking apart". She continues to take regular calcium, Vitamin D, and gets some weight-bearing exercise(up until gyms closed due to pandemic; tries to do some at home now but not as regularly). She had bone density test 07/2019. T-2.2 at L femoral neck (decline from -1.9), T-2.2 at R femoral neck. Abnormal FRAX score of 22% major osteoporotic fracture, 6% hip fracture. At her last visit we discussed these results in detail, that since she didn't tolerate alendronate due to jaw problems, Prolia may not be a good choice for her either. We discussed Evista, so she could research and decide if she wants to try this, vs recheck DEXA 07/2021. Plans to wait to recheck next year.  Immunization History  Administered Date(s) Administered   Fluad Quad(high Dose 65+) 07/20/2019   Influenza Split 08/26/2013, 09/08/2014, 08/17/2015  Influenza, High Dose Seasonal PF 09/12/2017, 08/06/2018, 08/13/2020   Influenza-Unspecified 08/20/2016   PFIZER SARS-COV-2 Vaccination 01/07/2020, 02/01/2020   Pneumococcal Conjugate-13 08/26/2014   Pneumococcal Polysaccharide-23 12/27/2000, 07/27/2008, 04/03/2018   Td 03/26/2005, 11/28/2017   Tdap 03/05/2012   Zoster 02/25/2011   Zoster Recombinat  (Shingrix) 02/11/2018, 08/06/2018   Last Pap smear: 10/2013, negative, no high risk HPV present Last mammogram: 07/2019, planning to schedule now. Last colonoscopy: 8/06; negative Cologard 01/2016 and 02/2020 Last DEXA:07/2019. T-2.2 at L femoral neck (decline from -1.9), T-2.2 at R femoral neck. Abnormal FRAX score of 22% major osteoporotic fracture, 6% hip fracture. Dentist: every 6 months Ophtho: yearly Exercise: Yardwork, walking (3-5x/week for 30 minutes. Doing some weight-bearing exercise with 2# handweights she has, 4-5x/week. (Prior routine: gym 3-4x/week, treadmill, and a 30 min program with 8-10 machines, and bike, rowing machines. She is there 60-90 minutes, at least half is cardio.)  Other doctors caring for patient include: Dentist: Dr. Oren Rogers at [facility] Ophtho: Dr. Gershon Rogers ENT: Dr. Benjamine Rogers Dr. Dub Rogers at [facility] for her melanoma Dermatologist: Dr. Pearline Rogers at [place]: Tonya Rogers   Depression screen: negative Fall screen: none Functional Status Screen: notable for occasional stress urinary incontinence, decreased hearing, tinnitus, wears hearing aids. Uses cane at the grocery store. Mini-Cog screen score of 4 (missed 1 on recall). See full screens in epic.  End of Life Discussion: Patient hasa living will and medical power of attorney   PMH, PSH, SH and FH were reviewed and updated  Outpatient Encounter Medications as of 09/01/2020  Medication Sig   aspirin 81 MG tablet Take 81 mg by mouth every other day.    BIOTIN PO Take 1 tablet by mouth daily.   Calcium Carbonate-Vitamin D (CALCIUM + D) 600-200 MG-UNIT TABS Take 1 tablet by mouth daily.    Cholecalciferol (VITAMIN D) 1000 UNITS capsule Take 1,000 Units by mouth daily.    Coenzyme Q10 (CO Q 10) 100 MG CAPS Take 1 capsule by mouth daily.   Exenatide ER (BYDUREON BCISE) 2 MG/0.85ML AUIJ Inject 2 mg into the skin once a week.   fish oil-omega-3 fatty acids 1000 MG capsule Take 3 g by mouth  daily.    glucose blood test strip 1 each by Other route 2 (two) times daily. Test twice daily   Insulin Pen Needle (BD PEN NEEDLE NANO U/F) 32G X 4 MM MISC USE AS DIRECTED WITH BYETTA(TWICE DAILY)   losartan (COZAAR) 100 MG tablet TAKE 1 TABLET(100 MG) BY MOUTH DAILY   magnesium gluconate (MAGONATE) 500 MG tablet Take 500 mg by mouth 2 (two) times daily.     metFORMIN (GLUCOPHAGE) 1000 MG tablet TAKE 1 TABLET BY MOUTH  TWICE DAILY WITH A MEAL   Multiple Vitamins-Minerals (MULTIVITAMIN WITH MINERALS) tablet Take 1 tablet by mouth daily.     pioglitazone (ACTOS) 15 MG tablet TAKE 1 TABLET BY MOUTH  DAILY   simvastatin (ZOCOR) 20 MG tablet Take 1 tablet (20 mg total) by mouth daily.   SYNTHROID 50 MCG tablet TAKE 1 TABLET DAILY BEFORE BREAKFAST   vitamin C (ASCORBIC ACID) 500 MG tablet Take 500 mg by mouth daily.     No facility-administered encounter medications on file as of 09/01/2020.   No Known Allergies  ROS: The patient denies anorexia, fever, headaches, vision changes, ear pain, sore throat, breast concerns, chest pain, palpitations, dizziness, syncope, dyspnea on exertion, cough, swelling, nausea, vomiting, diarrhea, abdominal pain, melena, hematochezia, indigestion/heartburn, hematuria, incontinence (just with cough/sneeze), dysuria,vaginal bleeding, discharge, odor  or itch, genital lesions, joint pains, weakness, tremor, suspicious skin lesions, depression, anxiety, abnormal bleeding/bruising (mild, chronic), or enlarged lymph nodes. Constipation, unchanged/chronic--controlled Hearing loss L>R ear, unchanged. Has hearing aids--not using them; doesn't find them all that helpful. Sees Dr. Benjamine Rogers yearly. +tingling/mild burning in feet, chronic, unchanged. Dry mouth. Still using fluoride trays at night.    PHYSICAL EXAM:  BP 140/80    Pulse 76    Ht 4' 11.5" (1.511 m)    Wt 177 lb (80.3 kg)    BMI 35.15 kg/m  142/70 on repeat by MD, RA  Wt Readings from Last 3 Encounters:   09/01/20 177 lb (80.3 kg)  02/18/20 181 lb 6.4 oz (82.3 kg)  07/20/19 176 lb 12.8 oz (80.2 kg)    General Appearance:   Alert, cooperative, no distress, appears stated age  Head:   Normocephalic, without obvious abnormality, atraumatic. Thinning of hair anteriorly  Eyes:   PERRL, conjunctiva/corneas clear, EOM's intact, fundi benign  Ears:   Normal externally.TM's and EAC's normal  Nose:  Not examined, wearing mask due to COVID-19  Throat:  Not examined, wearing mask due to COVID-19  Neck:  Supple, no lymphadenopathy; thyroid: noenlargement/ tenderness/nodules; no carotid bruit or JVD  Back:  Spine nontender, no curvature, ROM normal, no CVA tenderness  Lungs:   Clear to auscultation bilaterally without wheezes, rales or ronchi; respirations unlabored  Chest Wall:   No tenderness or deformity  Heart:   Regular rate and rhythm, S1 and S2 normal, 2/6 systolic ejection murmur is present; no rub or gallop  Breast Exam:   No tenderness, masses, or nipple discharge or inversion. No axillary lymphadenopathy  Abdomen:   Soft, non-tender, nondistended, normoactive bowel sounds, no masses, no hepatosplenomegaly.   Genitalia:   Normal external genitalia without lesions, atrophic changes noted. BUS and vagina normal; no cervical motion tenderness.No abnormal vaginal discharge. Uterus and adnexa not enlarged, nontender, no masses; exam somewhat limited by body habitus. Pap not performed  Rectal:   Normal tone, no masses or tenderness; heme negative stool  Extremities:  No clubbing, cyanosis or edema.  Pulses:  2+ and symmetric all extremities  Skin:  Skin color, texture, turgor normal, no rashes or lesions. WHSS R calf. 2 lesions, as described below. Normal sensation to monofilament.  Lymph nodes:  Cervical, supraclavicular, and axillary nodes normal  Neurologic:  Normal strength, sensation and gait;  reflexes 2+ and symmetric throughout   Psych: Normal mood, affect, hygiene and grooming  Diabetic foot exam--normal.  RLE:  Lateral to one of the horizontal scars there is a pinkish lesion, with some pigmentation within it, measuring approx 1 cm in size. Also a red papular area in the mid-portion of prior scar, centrally (approx 0.5cm)   Lab Results  Component Value Date   HGBA1C 6.0 (H) 08/29/2020     Chemistry      Component Value Date/Time   NA 135 08/29/2020 1034   K 4.6 08/29/2020 1034   CL 98 08/29/2020 1034   CO2 24 08/29/2020 1034   BUN 15 08/29/2020 1034   CREATININE 1.05 (H) 08/29/2020 1034   CREATININE 0.99 (H) 09/16/2017 1004      Component Value Date/Time   CALCIUM 10.3 08/29/2020 1034   CALCIUM 9.7 04/23/2012 0915   ALKPHOS 51 08/29/2020 1034   AST 24 08/29/2020 1034   ALT 18 08/29/2020 1034   BILITOT 0.4 08/29/2020 1034     Fasting glucose 127  Lab Results  Component Value Date  TSH 3.590 08/29/2020   Lab Results  Component Value Date   WBC 6.6 08/29/2020   HGB 12.4 08/29/2020   HCT 38.0 08/29/2020   MCV 78 (L) 08/29/2020   PLT 390 08/29/2020     ASSESSMENT/PLAN:  Medicare annual wellness visit, subsequent  Hypothyroidism, unspecified type - adequately replaced, continue - Plan: SYNTHROID 50 MCG tablet  Aortic atherosclerosis ([place]) - noted on CT. Cont aspirin and statin  Microalbuminuria - cont losartan.  Osteopenia with high risk of fracture - repeat DEXA next year. Discussed that FRAX scores aren't usually utilized with prior treatments. Cont wt-bearing exercise, Ca, D  Essential hypertension, benign - borderly BP here, but normal at home. cont current meds and monitoring at home.   Pure hypercholesterolemia  Controlled type 2 diabetes mellitus with complication, without long-term current use of insulin ([place]) - cont current treatment plan. Wt loss recommended  Hypertension associated with diabetes  ([place])  Hyperlipidemia associated with type 2 diabetes mellitus ([place])  Personal history of malignant melanoma - no e/o mets on routine surveillance, 2 suspicious lesions occuring in prior scars. To f/u with Dr. 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  Neuropathy is mild, not requiring treatment - Plan: Exenatide ER (BYDUREON BCISE) 2 MG/0.85ML AUIJ, pioglitazone (ACTOS) 15 MG tablet, metFORMIN (GLUCOPHAGE) 1000 MG tablet  Vaccine counseling - pt is 2 weeks from flu shot, declined COVID booster today, prefers to wait an additional two weeks   Discussed monthly self breast exams and yearly mammograms; at least 30 minutes of aerobic activity at least 5 days/week and weight-bearing exercise 2x/week; proper sunscreen use reviewed; healthy diet, including goals of calcium and vitamin D intake and alcohol recommendations (less than or equal to 1 drink/day) reviewed; regular seatbelt use; changing batteries in smoke detectors. Immunization recommendations discussed, UTD. COVID booster recommended, she prefers to wait an additional 2 weeks before getting.  Colonoscopy recommendations reviewed, Cologuard UTD. Screening no longer needed (unless develops symptoms/bowel changes).  DEXA due next year (07/2021). Pap smears not needed due to age and low risk.  MOST form reviewed and updated, Full Code, Full care.  F/u med check in 6 mos with fasting labs prior. A1c, lipids, urine microalb, c-met    Medicare Attestation I have personally reviewed: The patient's medical and social history Their use of alcohol, tobacco or illicit drugs Their current medications and supplements The patient's functional ability including ADLs,fall risks, home safety risks, cognitive, and hearing and visual impairment Diet and physical activities Evidence for depression or mood disorders  The patient's  weight, height, BMI have been recorded in the chart.  I have made referrals, counseling, and provided education to the patient based on review of the above and I have provided the patient with a written personalized care plan for preventive services.

## 2020-08-31 NOTE — Patient Instructions (Addendum)
  HEALTH MAINTENANCE RECOMMENDATIONS:  It is recommended that you get at least 30 minutes of aerobic exercise at least 5 days/week (for weight loss, you may need as much as 60-90 minutes). This can be any activity that gets your heart rate up. This can be divided in 10-15 minute intervals if needed, but try and build up your endurance at least once a week.  Weight bearing exercise is also recommended twice weekly.  Eat a healthy diet with lots of vegetables, fruits and fiber.  "Colorful" foods have a lot of vitamins (ie green vegetables, tomatoes, red peppers, etc).  Limit sweet tea, regular sodas and alcoholic beverages, all of which has a lot of calories and sugar.  Up to 1 alcoholic drink daily may be beneficial for women (unless trying to lose weight, watch sugars).  Drink a lot of water.  Calcium recommendations are 1200-1500 mg daily (1500 mg for postmenopausal women or women without ovaries), and vitamin D 1000 IU daily.  This should be obtained from diet and/or supplements (vitamins), and calcium should not be taken all at once, but in divided doses.  Monthly self breast exams and yearly mammograms for women over the age of 76 is recommended.  Sunscreen of at least SPF 30 should be used on all sun-exposed parts of the skin when outside between the hours of 10 am and 4 pm (not just when at beach or pool, but even with exercise, golf, tennis, and yard work!)  Use a sunscreen that says "broad spectrum" so it covers both UVA and UVB rays, and make sure to reapply every 1-2 hours.  Remember to change the batteries in your smoke detectors when changing your clock times in the spring and fall. Carbon monoxide detectors are recommended for your home.  Use your seat belt every time you are in a car, and please drive safely and not be distracted with cell phones and texting while driving.   Ms. Raisanen , Thank you for taking time to come for your Medicare Wellness Visit. I appreciate your ongoing  commitment to your health goals. Please review the following plan we discussed and let me know if I can assist you in the future.   This is a list of the screening recommended for you and due dates:  Health Maintenance  Topic Date Due  . Complete foot exam   07/19/2020  . Hemoglobin A1C  02/27/2021  . Eye exam for diabetics  07/20/2021  . Tetanus Vaccine  11/29/2027  . Flu Shot  Completed  . DEXA scan (bone density measurement)  Completed  . COVID-19 Vaccine  Completed  . Pneumonia vaccines  Completed   Foot exam was done today. Continue yearly mammograms, please call to schedule. Bone density is due to be repeated next year (07/2021).

## 2020-09-01 ENCOUNTER — Encounter: Payer: Self-pay | Admitting: Family Medicine

## 2020-09-01 ENCOUNTER — Ambulatory Visit (INDEPENDENT_AMBULATORY_CARE_PROVIDER_SITE_OTHER): Payer: Medicare Other | Admitting: Family Medicine

## 2020-09-01 ENCOUNTER — Other Ambulatory Visit: Payer: Self-pay

## 2020-09-01 VITALS — BP 140/80 | HR 76 | Ht 59.5 in | Wt 177.0 lb

## 2020-09-01 DIAGNOSIS — R809 Proteinuria, unspecified: Secondary | ICD-10-CM

## 2020-09-01 DIAGNOSIS — M8580 Other specified disorders of bone density and structure, unspecified site: Secondary | ICD-10-CM | POA: Diagnosis not present

## 2020-09-01 DIAGNOSIS — E1169 Type 2 diabetes mellitus with other specified complication: Secondary | ICD-10-CM | POA: Diagnosis not present

## 2020-09-01 DIAGNOSIS — E1159 Type 2 diabetes mellitus with other circulatory complications: Secondary | ICD-10-CM

## 2020-09-01 DIAGNOSIS — I2584 Coronary atherosclerosis due to calcified coronary lesion: Secondary | ICD-10-CM

## 2020-09-01 DIAGNOSIS — E118 Type 2 diabetes mellitus with unspecified complications: Secondary | ICD-10-CM

## 2020-09-01 DIAGNOSIS — E785 Hyperlipidemia, unspecified: Secondary | ICD-10-CM

## 2020-09-01 DIAGNOSIS — E1149 Type 2 diabetes mellitus with other diabetic neurological complication: Secondary | ICD-10-CM | POA: Diagnosis not present

## 2020-09-01 DIAGNOSIS — I2510 Atherosclerotic heart disease of native coronary artery without angina pectoris: Secondary | ICD-10-CM

## 2020-09-01 DIAGNOSIS — I700 Atherosclerosis of aorta: Secondary | ICD-10-CM | POA: Diagnosis not present

## 2020-09-01 DIAGNOSIS — I10 Essential (primary) hypertension: Secondary | ICD-10-CM | POA: Diagnosis not present

## 2020-09-01 DIAGNOSIS — I152 Hypertension secondary to endocrine disorders: Secondary | ICD-10-CM | POA: Diagnosis not present

## 2020-09-01 DIAGNOSIS — Z0000 Encounter for general adult medical examination without abnormal findings: Secondary | ICD-10-CM | POA: Diagnosis not present

## 2020-09-01 DIAGNOSIS — Z7185 Encounter for immunization safety counseling: Secondary | ICD-10-CM | POA: Diagnosis not present

## 2020-09-01 DIAGNOSIS — E039 Hypothyroidism, unspecified: Secondary | ICD-10-CM | POA: Diagnosis not present

## 2020-09-01 DIAGNOSIS — Z85820 Personal history of malignant melanoma of skin: Secondary | ICD-10-CM

## 2020-09-01 DIAGNOSIS — E7800 Pure hypercholesterolemia, unspecified: Secondary | ICD-10-CM

## 2020-09-01 MED ORDER — SYNTHROID 50 MCG PO TABS: 50.0000 ug | ORAL_TABLET | Freq: Every day | ORAL | 1 refills | Status: DC

## 2020-09-01 MED ORDER — BYDUREON BCISE 2 MG/0.85ML ~~LOC~~ AUIJ: 2.0000 mg | AUTO-INJECTOR | SUBCUTANEOUS | 5 refills | Status: DC

## 2020-09-01 MED ORDER — METFORMIN HCL 1000 MG PO TABS: ORAL_TABLET | ORAL | 1 refills | Status: DC

## 2020-09-01 MED ORDER — PIOGLITAZONE HCL 15 MG PO TABS: 15.0000 mg | ORAL_TABLET | Freq: Every day | ORAL | 1 refills | Status: DC

## 2020-09-02 NOTE — Progress Notes (Signed)
done

## 2020-09-13 DIAGNOSIS — Z23 Encounter for immunization: Secondary | ICD-10-CM | POA: Diagnosis not present

## 2020-10-03 ENCOUNTER — Other Ambulatory Visit: Payer: Self-pay | Admitting: *Deleted

## 2020-10-03 DIAGNOSIS — E1149 Type 2 diabetes mellitus with other diabetic neurological complication: Secondary | ICD-10-CM

## 2020-10-03 MED ORDER — BYDUREON BCISE 2 MG/0.85ML ~~LOC~~ AUIJ: 2.0000 mg | AUTO-INJECTOR | SUBCUTANEOUS | 5 refills | Status: DC

## 2020-10-26 DIAGNOSIS — R918 Other nonspecific abnormal finding of lung field: Secondary | ICD-10-CM | POA: Diagnosis not present

## 2020-10-26 DIAGNOSIS — C439 Malignant melanoma of skin, unspecified: Secondary | ICD-10-CM | POA: Diagnosis not present

## 2020-10-26 DIAGNOSIS — C438 Malignant melanoma of overlapping sites of skin: Secondary | ICD-10-CM | POA: Diagnosis not present

## 2020-11-02 DIAGNOSIS — Z9889 Other specified postprocedural states: Secondary | ICD-10-CM | POA: Diagnosis not present

## 2020-11-02 DIAGNOSIS — Z7982 Long term (current) use of aspirin: Secondary | ICD-10-CM | POA: Diagnosis not present

## 2020-11-02 DIAGNOSIS — C439 Malignant melanoma of skin, unspecified: Secondary | ICD-10-CM | POA: Diagnosis not present

## 2020-11-02 DIAGNOSIS — R918 Other nonspecific abnormal finding of lung field: Secondary | ICD-10-CM | POA: Diagnosis not present

## 2020-11-02 DIAGNOSIS — Z85820 Personal history of malignant melanoma of skin: Secondary | ICD-10-CM | POA: Diagnosis not present

## 2020-11-02 DIAGNOSIS — L905 Scar conditions and fibrosis of skin: Secondary | ICD-10-CM | POA: Diagnosis not present

## 2020-11-02 DIAGNOSIS — L989 Disorder of the skin and subcutaneous tissue, unspecified: Secondary | ICD-10-CM | POA: Diagnosis not present

## 2020-11-29 DIAGNOSIS — E785 Hyperlipidemia, unspecified: Secondary | ICD-10-CM | POA: Diagnosis not present

## 2020-11-29 DIAGNOSIS — Z9889 Other specified postprocedural states: Secondary | ICD-10-CM | POA: Diagnosis not present

## 2020-11-29 DIAGNOSIS — E119 Type 2 diabetes mellitus without complications: Secondary | ICD-10-CM | POA: Diagnosis not present

## 2020-11-29 DIAGNOSIS — Z7984 Long term (current) use of oral hypoglycemic drugs: Secondary | ICD-10-CM | POA: Diagnosis not present

## 2020-11-29 DIAGNOSIS — C4371 Malignant melanoma of right lower limb, including hip: Secondary | ICD-10-CM | POA: Diagnosis not present

## 2020-11-29 DIAGNOSIS — I10 Essential (primary) hypertension: Secondary | ICD-10-CM | POA: Diagnosis not present

## 2020-11-29 DIAGNOSIS — Z85820 Personal history of malignant melanoma of skin: Secondary | ICD-10-CM | POA: Diagnosis not present

## 2020-12-01 ENCOUNTER — Other Ambulatory Visit: Payer: Self-pay | Admitting: Family Medicine

## 2020-12-01 DIAGNOSIS — E039 Hypothyroidism, unspecified: Secondary | ICD-10-CM

## 2020-12-06 DIAGNOSIS — Z85820 Personal history of malignant melanoma of skin: Secondary | ICD-10-CM | POA: Diagnosis not present

## 2020-12-06 DIAGNOSIS — I10 Essential (primary) hypertension: Secondary | ICD-10-CM | POA: Diagnosis not present

## 2020-12-06 DIAGNOSIS — E785 Hyperlipidemia, unspecified: Secondary | ICD-10-CM | POA: Diagnosis not present

## 2020-12-06 DIAGNOSIS — E119 Type 2 diabetes mellitus without complications: Secondary | ICD-10-CM | POA: Diagnosis not present

## 2020-12-06 DIAGNOSIS — Z9889 Other specified postprocedural states: Secondary | ICD-10-CM | POA: Diagnosis not present

## 2020-12-06 DIAGNOSIS — C4371 Malignant melanoma of right lower limb, including hip: Secondary | ICD-10-CM | POA: Diagnosis not present

## 2020-12-30 DIAGNOSIS — Z4802 Encounter for removal of sutures: Secondary | ICD-10-CM | POA: Diagnosis not present

## 2020-12-30 DIAGNOSIS — L989 Disorder of the skin and subcutaneous tissue, unspecified: Secondary | ICD-10-CM | POA: Diagnosis not present

## 2020-12-30 DIAGNOSIS — R918 Other nonspecific abnormal finding of lung field: Secondary | ICD-10-CM | POA: Diagnosis not present

## 2020-12-30 DIAGNOSIS — Z9889 Other specified postprocedural states: Secondary | ICD-10-CM | POA: Diagnosis not present

## 2021-01-13 DIAGNOSIS — Z1231 Encounter for screening mammogram for malignant neoplasm of breast: Secondary | ICD-10-CM | POA: Diagnosis not present

## 2021-01-13 LAB — HM MAMMOGRAPHY

## 2021-01-19 ENCOUNTER — Encounter: Payer: Self-pay | Admitting: *Deleted

## 2021-01-19 ENCOUNTER — Encounter: Payer: Self-pay | Admitting: Family Medicine

## 2021-02-03 ENCOUNTER — Other Ambulatory Visit: Payer: Self-pay | Admitting: Family Medicine

## 2021-02-03 DIAGNOSIS — E1149 Type 2 diabetes mellitus with other diabetic neurological complication: Secondary | ICD-10-CM

## 2021-02-06 NOTE — Telephone Encounter (Signed)
Left message asking patient if she needs these filled prior to her 03/01/21 appointment-I can handle when I return Wed.

## 2021-02-21 ENCOUNTER — Other Ambulatory Visit: Payer: Self-pay | Admitting: Family Medicine

## 2021-02-21 DIAGNOSIS — E1149 Type 2 diabetes mellitus with other diabetic neurological complication: Secondary | ICD-10-CM

## 2021-02-21 DIAGNOSIS — I10 Essential (primary) hypertension: Secondary | ICD-10-CM

## 2021-02-22 ENCOUNTER — Other Ambulatory Visit: Payer: Self-pay | Admitting: Family Medicine

## 2021-02-22 DIAGNOSIS — E7800 Pure hypercholesterolemia, unspecified: Secondary | ICD-10-CM

## 2021-02-22 DIAGNOSIS — I10 Essential (primary) hypertension: Secondary | ICD-10-CM

## 2021-02-27 ENCOUNTER — Other Ambulatory Visit: Payer: Medicare Other

## 2021-02-27 ENCOUNTER — Other Ambulatory Visit: Payer: Self-pay

## 2021-02-27 DIAGNOSIS — E118 Type 2 diabetes mellitus with unspecified complications: Secondary | ICD-10-CM | POA: Diagnosis not present

## 2021-02-27 DIAGNOSIS — E7800 Pure hypercholesterolemia, unspecified: Secondary | ICD-10-CM | POA: Diagnosis not present

## 2021-02-27 DIAGNOSIS — R809 Proteinuria, unspecified: Secondary | ICD-10-CM

## 2021-02-27 DIAGNOSIS — I10 Essential (primary) hypertension: Secondary | ICD-10-CM

## 2021-02-28 LAB — HEMOGLOBIN A1C
Est. average glucose Bld gHb Est-mCnc: 137 mg/dL
Hgb A1c MFr Bld: 6.4 % — ABNORMAL HIGH (ref 4.8–5.6)

## 2021-02-28 LAB — COMPREHENSIVE METABOLIC PANEL
ALT: 20 IU/L (ref 0–32)
AST: 28 IU/L (ref 0–40)
Albumin/Globulin Ratio: 1.8 (ref 1.2–2.2)
Albumin: 4.5 g/dL (ref 3.6–4.6)
Alkaline Phosphatase: 64 IU/L (ref 44–121)
BUN/Creatinine Ratio: 13 (ref 12–28)
BUN: 12 mg/dL (ref 8–27)
Bilirubin Total: 0.3 mg/dL (ref 0.0–1.2)
CO2: 25 mmol/L (ref 20–29)
Calcium: 9.9 mg/dL (ref 8.7–10.3)
Chloride: 96 mmol/L (ref 96–106)
Creatinine, Ser: 0.95 mg/dL (ref 0.57–1.00)
Globulin, Total: 2.5 g/dL (ref 1.5–4.5)
Glucose: 110 mg/dL — ABNORMAL HIGH (ref 65–99)
Potassium: 4.5 mmol/L (ref 3.5–5.2)
Sodium: 137 mmol/L (ref 134–144)
Total Protein: 7 g/dL (ref 6.0–8.5)
eGFR: 60 mL/min/{1.73_m2} (ref >59)

## 2021-02-28 LAB — MICROALBUMIN / CREATININE URINE RATIO
Creatinine, Urine: 59.1 mg/dL
Microalb/Creat Ratio: 121 mg/g creat — ABNORMAL HIGH (ref 0–29)
Microalbumin, Urine: 71.7 ug/mL

## 2021-02-28 LAB — LIPID PANEL
Chol/HDL Ratio: 1.9 ratio (ref 0.0–4.4)
Cholesterol, Total: 144 mg/dL (ref 100–199)
HDL: 74 mg/dL (ref >39)
LDL Chol Calc (NIH): 57 mg/dL (ref 0–99)
Triglycerides: 63 mg/dL (ref 0–149)
VLDL Cholesterol Cal: 13 mg/dL (ref 5–40)

## 2021-02-28 NOTE — Progress Notes (Signed)
Chief Complaint  Patient presents with  . Diabetes    Nonfasting med check, does want covid booster but prefers to get at her local pharmacy (will get in the next week). No new concerns.    Patient presents for 6 month follow-up on chronic problems. See below for labs done prior to visit.  She had further surgery for melanoma excision in 11/2020. She had 3 different areas excised on RLE.  Pathology report showed metastatic melanoma, with 1 area extending to lateral margin (other 2 biopsied areas had clear margins).  Suspicious for lymphovascular invasion.   CT scans in 10/2020 showed no evidence of metastatic disease in the chest abdomen or pelvis. Stable pulmonary nodules.  WU:[id number] well on the Bydureon B-cise, pioglitazone and metformin. Sugars have been running100-125, a few times higher (mostly checking fasting). Denieshypoglycemia, polydipsia or polyuria. Has some neuropathy--tingling in feet (only very slight tingling in hands), a mild burning, not painful or bothersome, unchanged/chronic.  Last eye exam was8/2021, no retinopathy. She checks her feet regularly, no sores/concerns. She has microalbuminuria.  She is on Losartan.  Hyperlipidemia--She is compliant with her medications, taking 20mg  of simvastatin and 3000mg  of fish oil. Denies any side effects to the medications, and is following a lowfat, low cholesterol diet.  Hypertension follow-up: She is compliant with losartan 100mg , and denies side effects. She limits the sodium in her diet. Blood pressures at home are 134/78 at home once, only checked infrequently at home.  Was high at Dr. Georgina Snell office, but reports it came down on recheck. She does some exercises at home, yardwork. Denies dizziness, headaches, chest pain, DOE, edema.  Hypothyroidism: She denies any missed pills, taking the branded Synthroid. She has always been crushing medication, mixing it with water, as she had trouble swallowing the pill when she tried  swallowing it whole. She has been taking it this way for many years. Denies any change in moods, bowels, temperature intolerance. She has had some issues with nail splitting over the years, butBiotin has helped.Dry skin mainly in winter.Constipation is managed well with prunes.  Lab Results  Component Value Date   TSH 3.590 08/29/2020     PMH, PSH, SH reviewed  Outpatient Encounter Medications as of 03/01/2021  Medication Sig  . BIOTIN PO Take 1 tablet by mouth daily.  . Calcium Carbonate-Vitamin D 600-200 MG-UNIT TABS Take 1 tablet by mouth daily.  . Cholecalciferol (VITAMIN D) 1000 UNITS capsule Take 1,000 Units by mouth daily.  . Coenzyme Q10 (CO Q 10) 100 MG CAPS Take 1 capsule by mouth daily.  . Exenatide ER (BYDUREON BCISE) 2 MG/0.85ML AUIJ Inject 2 mg into the skin once a week.  . fish oil-omega-3 fatty acids 1000 MG capsule Take 3 g by mouth daily.  Marland Kitchen glucose blood test strip 1 each by Other route 2 (two) times daily. Test twice daily  . Insulin Pen Needle (BD PEN NEEDLE NANO U/F) 32G X 4 MM MISC USE AS DIRECTED WITH BYETTA(TWICE DAILY)  . losartan (COZAAR) 100 MG tablet TAKE 1 TABLET(100 MG) BY MOUTH DAILY  . magnesium gluconate (MAGONATE) 500 MG tablet Take 500 mg by mouth 2 (two) times daily.  . metFORMIN (GLUCOPHAGE) 1000 MG tablet TAKE 1 TABLET BY MOUTH  TWICE DAILY WITH A MEAL  . Multiple Vitamins-Minerals (MULTIVITAMIN WITH MINERALS) tablet Take 1 tablet by mouth daily.  . pioglitazone (ACTOS) 15 MG tablet Take 1 tablet (15 mg total) by mouth daily.  . simvastatin (ZOCOR) 20 MG tablet Take 1 tablet (  20 mg total) by mouth daily.  Marland Kitchen SYNTHROID 50 MCG tablet TAKE 1 TABLET DAILY BEFORE BREAKFAST  . vitamin C (ASCORBIC ACID) 500 MG tablet Take 500 mg by mouth daily.  . [DISCONTINUED] aspirin 81 MG tablet Take 81 mg by mouth every other day.   No facility-administered encounter medications on file as of 03/01/2021.   No Known Allergies  ROS: no headaches, dizziness, URI  symptoms, fever, chills. No syncope. No chest pain, palpitations, shortness of breath.  No GI complaints or GU complaints. No easy bleeding, bruising, rashes. No changes to energy, hair/skin/nails, bowels. Moods are good. Infrequent R thigh pain Uses cane when walking outside of her home. (no falls, uses it for safety, worries about falls). Intentional weight loss--limiting portions.    PHYSICAL EXAM:  BP 132/78   Pulse 64   Ht 4' 11.5" (1.511 m)   Wt 168 lb 9.6 oz (76.5 kg)   BMI 33.48 kg/m    Wt Readings from Last 3 Encounters:  09/01/20 177 lb (80.3 kg)  02/18/20 181 lb 6.4 oz (82.3 kg)  07/20/19 176 lb 12.8 oz (80.2 kg)   Well developed, pleasant female, in good spirits HEENT: conjunctiva and sclera are clear, EOMI. Wearing mask Neck: no lymphadenopathy, thyromegaly or mass, no bruit Heart: regular rate and rhythm, murmur at RUSB unchanged Lungs: clear bilaterally Back: no CVA or spinal tenderness Abdomen: soft, nontender, no organomegaly or mass Extremities:noedema, normal pulses Skin:Alopecia/thinning of hair on scalp, unchanged.  Eschars in 3 areas of recent biopsies--below knee (centrally, also laterally, and one at the lateral R ankle) The inferior lesion (at ankle)--has some mild surrounding erythema.  There is some weeping and crusting on the sock. Psych: normal mood, affect, hygiene and grooming Neuro: alert and oriented, normal gait, using cane.   Lab Results  Component Value Date   HGBA1C 6.4 (H) 02/27/2021   Urine microalbumin/Cr ratio elevated at 121    Chemistry      Component Value Date/Time   NA 137 02/27/2021 1018   K 4.5 02/27/2021 1018   CL 96 02/27/2021 1018   CO2 25 02/27/2021 1018   BUN 12 02/27/2021 1018   CREATININE 0.95 02/27/2021 1018   CREATININE 0.99 (H) 09/16/2017 1004      Component Value Date/Time   CALCIUM 9.9 02/27/2021 1018   CALCIUM 9.7 04/23/2012 0915   ALKPHOS 64 02/27/2021 1018   AST 28 02/27/2021 1018   ALT 20  02/27/2021 1018   BILITOT 0.3 02/27/2021 1018     Fasting glu 110  Lab Results  Component Value Date   CHOL 144 02/27/2021   HDL 74 02/27/2021   LDLCALC 57 02/27/2021   TRIG 63 02/27/2021   CHOLHDL 1.9 02/27/2021    ASSESSMENT/PLAN:  Controlled type 2 diabetes mellitus with complication, without long-term current use of insulin (HCC) - complications--neuropathy, HTN, HLD, microalbuminuria. Controlled on currnet regimen. further wt loss rec - Plan: Hemoglobin A1c  Melanoma of skin ([place]) - under care of WF and local derm. Some issues with healing, wound care instructions given - Plan: Comprehensive metabolic panel, CBC with Differential/Platelet  Hypothyroidism, unspecified type - euthyroid by history, cont current dose .Recheck next visit - Plan: TSH  Microalbuminuria - cont losartan. 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 Encouraged regular exercise, healthy diet, futher weight loss   Will get COVID booster at pharmacy.  F/u at CPE with labs prior-- A1c, TSH, c-met, cbc  New aspirin recommendations reviewed, can stop.

## 2021-03-01 ENCOUNTER — Other Ambulatory Visit: Payer: Self-pay

## 2021-03-01 ENCOUNTER — Ambulatory Visit (INDEPENDENT_AMBULATORY_CARE_PROVIDER_SITE_OTHER): Payer: Medicare Other | Admitting: Family Medicine

## 2021-03-01 ENCOUNTER — Encounter: Payer: Self-pay | Admitting: Family Medicine

## 2021-03-01 VITALS — BP 132/78 | HR 64 | Ht 59.5 in | Wt 168.6 lb

## 2021-03-01 DIAGNOSIS — E6609 Other obesity due to excess calories: Secondary | ICD-10-CM | POA: Diagnosis not present

## 2021-03-01 DIAGNOSIS — E1149 Type 2 diabetes mellitus with other diabetic neurological complication: Secondary | ICD-10-CM

## 2021-03-01 DIAGNOSIS — C439 Malignant melanoma of skin, unspecified: Secondary | ICD-10-CM

## 2021-03-01 DIAGNOSIS — E039 Hypothyroidism, unspecified: Secondary | ICD-10-CM | POA: Diagnosis not present

## 2021-03-01 DIAGNOSIS — Z5181 Encounter for therapeutic drug level monitoring: Secondary | ICD-10-CM | POA: Diagnosis not present

## 2021-03-01 DIAGNOSIS — I152 Hypertension secondary to endocrine disorders: Secondary | ICD-10-CM

## 2021-03-01 DIAGNOSIS — E118 Type 2 diabetes mellitus with unspecified complications: Secondary | ICD-10-CM

## 2021-03-01 DIAGNOSIS — E785 Hyperlipidemia, unspecified: Secondary | ICD-10-CM | POA: Diagnosis not present

## 2021-03-01 DIAGNOSIS — E1159 Type 2 diabetes mellitus with other circulatory complications: Secondary | ICD-10-CM | POA: Diagnosis not present

## 2021-03-01 DIAGNOSIS — E1169 Type 2 diabetes mellitus with other specified complication: Secondary | ICD-10-CM | POA: Diagnosis not present

## 2021-03-01 DIAGNOSIS — E7800 Pure hypercholesterolemia, unspecified: Secondary | ICD-10-CM | POA: Diagnosis not present

## 2021-03-01 DIAGNOSIS — R809 Proteinuria, unspecified: Secondary | ICD-10-CM | POA: Diagnosis not present

## 2021-03-01 DIAGNOSIS — Z6833 Body mass index (BMI) 33.0-33.9, adult: Secondary | ICD-10-CM

## 2021-03-01 DIAGNOSIS — E66811 Obesity, class 1: Secondary | ICD-10-CM

## 2021-03-01 MED ORDER — PIOGLITAZONE HCL 15 MG PO TABS: 15.0000 mg | ORAL_TABLET | Freq: Every day | ORAL | 1 refills | Status: DC

## 2021-03-01 MED ORDER — METFORMIN HCL 1000 MG PO TABS: ORAL_TABLET | ORAL | 1 refills | Status: DC

## 2021-03-01 MED ORDER — SIMVASTATIN 20 MG PO TABS: 20.0000 mg | ORAL_TABLET | Freq: Every day | ORAL | 3 refills | Status: DC

## 2021-03-01 NOTE — Patient Instructions (Signed)
You may stop taking aspirin. Continue all other medications.  Get your COVID booster from the pharmacy (or return here).  Please use antibacterial ointment to the wounds from the recent excisions.  Keep the one at the ankle covered with a bandage when wearing socks to prevent further irritation or trauma. There is some redness around the scab.  Please keep an eye on this to make sure that there isn't any swelling, increasing redness, warmth or any fever. If any concerns develop, return here or to dermatologist for recheck.   Wt Readings from Last 3 Encounters:  03/01/21 168 lb 9.6 oz (76.5 kg)  09/01/20 177 lb (80.3 kg)  02/18/20 181 lb 6.4 oz (82.3 kg)

## 2021-03-23 DIAGNOSIS — L814 Other melanin hyperpigmentation: Secondary | ICD-10-CM | POA: Diagnosis not present

## 2021-03-23 DIAGNOSIS — D229 Melanocytic nevi, unspecified: Secondary | ICD-10-CM | POA: Diagnosis not present

## 2021-03-23 DIAGNOSIS — L821 Other seborrheic keratosis: Secondary | ICD-10-CM | POA: Diagnosis not present

## 2021-03-23 DIAGNOSIS — Z85820 Personal history of malignant melanoma of skin: Secondary | ICD-10-CM | POA: Diagnosis not present

## 2021-03-23 DIAGNOSIS — L570 Actinic keratosis: Secondary | ICD-10-CM | POA: Diagnosis not present

## 2021-03-23 DIAGNOSIS — D485 Neoplasm of uncertain behavior of skin: Secondary | ICD-10-CM | POA: Diagnosis not present

## 2021-03-23 DIAGNOSIS — C4371 Malignant melanoma of right lower limb, including hip: Secondary | ICD-10-CM | POA: Diagnosis not present

## 2021-03-23 DIAGNOSIS — D692 Other nonthrombocytopenic purpura: Secondary | ICD-10-CM | POA: Diagnosis not present

## 2021-03-23 DIAGNOSIS — D1801 Hemangioma of skin and subcutaneous tissue: Secondary | ICD-10-CM | POA: Diagnosis not present

## 2021-03-23 DIAGNOSIS — L905 Scar conditions and fibrosis of skin: Secondary | ICD-10-CM | POA: Diagnosis not present

## 2021-03-23 DIAGNOSIS — L819 Disorder of pigmentation, unspecified: Secondary | ICD-10-CM | POA: Diagnosis not present

## 2021-03-27 DIAGNOSIS — C792 Secondary malignant neoplasm of skin: Secondary | ICD-10-CM | POA: Diagnosis not present

## 2021-03-27 DIAGNOSIS — C439 Malignant melanoma of skin, unspecified: Secondary | ICD-10-CM | POA: Diagnosis not present

## 2021-03-28 DIAGNOSIS — Z23 Encounter for immunization: Secondary | ICD-10-CM | POA: Diagnosis not present

## 2021-03-29 ENCOUNTER — Other Ambulatory Visit: Payer: Self-pay | Admitting: Family Medicine

## 2021-03-29 DIAGNOSIS — E1149 Type 2 diabetes mellitus with other diabetic neurological complication: Secondary | ICD-10-CM

## 2021-06-03 ENCOUNTER — Other Ambulatory Visit: Payer: Self-pay | Admitting: Family Medicine

## 2021-06-03 DIAGNOSIS — I10 Essential (primary) hypertension: Secondary | ICD-10-CM

## 2021-06-19 ENCOUNTER — Other Ambulatory Visit: Payer: Self-pay | Admitting: *Deleted

## 2021-06-19 DIAGNOSIS — E039 Hypothyroidism, unspecified: Secondary | ICD-10-CM

## 2021-06-19 MED ORDER — SYNTHROID 50 MCG PO TABS: 50.0000 ug | ORAL_TABLET | Freq: Every day | ORAL | 1 refills | Status: DC

## 2021-06-28 DIAGNOSIS — C4371 Malignant melanoma of right lower limb, including hip: Secondary | ICD-10-CM | POA: Diagnosis not present

## 2021-06-28 DIAGNOSIS — C439 Malignant melanoma of skin, unspecified: Secondary | ICD-10-CM | POA: Diagnosis not present

## 2021-06-28 DIAGNOSIS — L989 Disorder of the skin and subcutaneous tissue, unspecified: Secondary | ICD-10-CM | POA: Diagnosis not present

## 2021-06-28 DIAGNOSIS — R918 Other nonspecific abnormal finding of lung field: Secondary | ICD-10-CM | POA: Diagnosis not present

## 2021-06-28 DIAGNOSIS — R222 Localized swelling, mass and lump, trunk: Secondary | ICD-10-CM | POA: Diagnosis not present

## 2021-06-30 DIAGNOSIS — C439 Malignant melanoma of skin, unspecified: Secondary | ICD-10-CM | POA: Diagnosis not present

## 2021-06-30 DIAGNOSIS — R918 Other nonspecific abnormal finding of lung field: Secondary | ICD-10-CM | POA: Diagnosis not present

## 2021-06-30 DIAGNOSIS — Z85820 Personal history of malignant melanoma of skin: Secondary | ICD-10-CM | POA: Diagnosis not present

## 2021-07-07 ENCOUNTER — Other Ambulatory Visit: Payer: Self-pay | Admitting: Family Medicine

## 2021-07-07 DIAGNOSIS — Z5181 Encounter for therapeutic drug level monitoring: Secondary | ICD-10-CM

## 2021-07-10 DIAGNOSIS — L819 Disorder of pigmentation, unspecified: Secondary | ICD-10-CM | POA: Diagnosis not present

## 2021-07-10 DIAGNOSIS — L905 Scar conditions and fibrosis of skin: Secondary | ICD-10-CM | POA: Diagnosis not present

## 2021-07-10 DIAGNOSIS — D229 Melanocytic nevi, unspecified: Secondary | ICD-10-CM | POA: Diagnosis not present

## 2021-07-10 DIAGNOSIS — L821 Other seborrheic keratosis: Secondary | ICD-10-CM | POA: Diagnosis not present

## 2021-07-10 DIAGNOSIS — Z85820 Personal history of malignant melanoma of skin: Secondary | ICD-10-CM | POA: Diagnosis not present

## 2021-07-10 DIAGNOSIS — L814 Other melanin hyperpigmentation: Secondary | ICD-10-CM | POA: Diagnosis not present

## 2021-07-25 DIAGNOSIS — Z794 Long term (current) use of insulin: Secondary | ICD-10-CM | POA: Diagnosis not present

## 2021-07-25 DIAGNOSIS — E119 Type 2 diabetes mellitus without complications: Secondary | ICD-10-CM | POA: Diagnosis not present

## 2021-07-25 DIAGNOSIS — Z961 Presence of intraocular lens: Secondary | ICD-10-CM | POA: Diagnosis not present

## 2021-07-25 LAB — HM DIABETES EYE EXAM

## 2021-08-12 ENCOUNTER — Other Ambulatory Visit: Payer: Self-pay | Admitting: Family Medicine

## 2021-08-12 DIAGNOSIS — E1149 Type 2 diabetes mellitus with other diabetic neurological complication: Secondary | ICD-10-CM

## 2021-08-14 ENCOUNTER — Other Ambulatory Visit: Payer: Self-pay | Admitting: *Deleted

## 2021-08-14 DIAGNOSIS — I10 Essential (primary) hypertension: Secondary | ICD-10-CM

## 2021-08-14 MED ORDER — LOSARTAN POTASSIUM 100 MG PO TABS: ORAL_TABLET | ORAL | 0 refills | Status: DC

## 2021-08-18 DIAGNOSIS — Z23 Encounter for immunization: Secondary | ICD-10-CM | POA: Diagnosis not present

## 2021-08-23 DIAGNOSIS — C439 Malignant melanoma of skin, unspecified: Secondary | ICD-10-CM | POA: Diagnosis not present

## 2021-08-23 DIAGNOSIS — C4371 Malignant melanoma of right lower limb, including hip: Secondary | ICD-10-CM | POA: Diagnosis not present

## 2021-08-23 DIAGNOSIS — C779 Secondary and unspecified malignant neoplasm of lymph node, unspecified: Secondary | ICD-10-CM | POA: Diagnosis not present

## 2021-08-23 DIAGNOSIS — R918 Other nonspecific abnormal finding of lung field: Secondary | ICD-10-CM | POA: Diagnosis not present

## 2021-08-30 DIAGNOSIS — Z79899 Other long term (current) drug therapy: Secondary | ICD-10-CM | POA: Diagnosis not present

## 2021-08-30 DIAGNOSIS — C439 Malignant melanoma of skin, unspecified: Secondary | ICD-10-CM | POA: Diagnosis not present

## 2021-08-30 DIAGNOSIS — R7989 Other specified abnormal findings of blood chemistry: Secondary | ICD-10-CM | POA: Diagnosis not present

## 2021-08-30 DIAGNOSIS — C792 Secondary malignant neoplasm of skin: Secondary | ICD-10-CM | POA: Diagnosis not present

## 2021-08-30 DIAGNOSIS — R946 Abnormal results of thyroid function studies: Secondary | ICD-10-CM | POA: Diagnosis not present

## 2021-08-30 DIAGNOSIS — C779 Secondary and unspecified malignant neoplasm of lymph node, unspecified: Secondary | ICD-10-CM | POA: Diagnosis not present

## 2021-08-30 DIAGNOSIS — C4371 Malignant melanoma of right lower limb, including hip: Secondary | ICD-10-CM | POA: Diagnosis not present

## 2021-08-30 DIAGNOSIS — Z5112 Encounter for antineoplastic immunotherapy: Secondary | ICD-10-CM | POA: Diagnosis not present

## 2021-08-30 DIAGNOSIS — R918 Other nonspecific abnormal finding of lung field: Secondary | ICD-10-CM | POA: Diagnosis not present

## 2021-09-07 ENCOUNTER — Other Ambulatory Visit: Payer: Medicare Other

## 2021-09-13 DIAGNOSIS — H838X3 Other specified diseases of inner ear, bilateral: Secondary | ICD-10-CM | POA: Diagnosis not present

## 2021-09-13 DIAGNOSIS — H9312 Tinnitus, left ear: Secondary | ICD-10-CM | POA: Diagnosis not present

## 2021-09-13 DIAGNOSIS — R070 Pain in throat: Secondary | ICD-10-CM | POA: Diagnosis not present

## 2021-09-13 DIAGNOSIS — R053 Chronic cough: Secondary | ICD-10-CM | POA: Diagnosis not present

## 2021-09-13 DIAGNOSIS — H903 Sensorineural hearing loss, bilateral: Secondary | ICD-10-CM | POA: Diagnosis not present

## 2021-09-14 ENCOUNTER — Ambulatory Visit: Payer: Medicare Other | Admitting: Family Medicine

## 2021-09-27 DIAGNOSIS — C4371 Malignant melanoma of right lower limb, including hip: Secondary | ICD-10-CM | POA: Diagnosis not present

## 2021-09-27 DIAGNOSIS — C439 Malignant melanoma of skin, unspecified: Secondary | ICD-10-CM | POA: Diagnosis not present

## 2021-09-27 DIAGNOSIS — R918 Other nonspecific abnormal finding of lung field: Secondary | ICD-10-CM | POA: Diagnosis not present

## 2021-09-27 DIAGNOSIS — R946 Abnormal results of thyroid function studies: Secondary | ICD-10-CM | POA: Diagnosis not present

## 2021-09-27 DIAGNOSIS — C779 Secondary and unspecified malignant neoplasm of lymph node, unspecified: Secondary | ICD-10-CM | POA: Diagnosis not present

## 2021-09-27 DIAGNOSIS — Z79899 Other long term (current) drug therapy: Secondary | ICD-10-CM | POA: Diagnosis not present

## 2021-09-27 DIAGNOSIS — Z5112 Encounter for antineoplastic immunotherapy: Secondary | ICD-10-CM | POA: Diagnosis not present

## 2021-09-27 DIAGNOSIS — E039 Hypothyroidism, unspecified: Secondary | ICD-10-CM | POA: Diagnosis not present

## 2021-10-07 ENCOUNTER — Other Ambulatory Visit: Payer: Self-pay | Admitting: Family Medicine

## 2021-10-07 DIAGNOSIS — E1149 Type 2 diabetes mellitus with other diabetic neurological complication: Secondary | ICD-10-CM

## 2021-10-09 ENCOUNTER — Other Ambulatory Visit: Payer: Self-pay | Admitting: Family Medicine

## 2021-10-09 DIAGNOSIS — Z23 Encounter for immunization: Secondary | ICD-10-CM | POA: Diagnosis not present

## 2021-10-09 DIAGNOSIS — E1149 Type 2 diabetes mellitus with other diabetic neurological complication: Secondary | ICD-10-CM

## 2021-10-10 ENCOUNTER — Other Ambulatory Visit: Payer: BLUE CROSS/BLUE SHIELD

## 2021-10-10 ENCOUNTER — Other Ambulatory Visit: Payer: Self-pay

## 2021-10-10 ENCOUNTER — Other Ambulatory Visit: Payer: Medicare Other

## 2021-10-10 DIAGNOSIS — E118 Type 2 diabetes mellitus with unspecified complications: Secondary | ICD-10-CM

## 2021-10-10 DIAGNOSIS — C439 Malignant melanoma of skin, unspecified: Secondary | ICD-10-CM

## 2021-10-10 DIAGNOSIS — E1159 Type 2 diabetes mellitus with other circulatory complications: Secondary | ICD-10-CM

## 2021-10-10 DIAGNOSIS — Z5181 Encounter for therapeutic drug level monitoring: Secondary | ICD-10-CM | POA: Diagnosis not present

## 2021-10-10 DIAGNOSIS — E039 Hypothyroidism, unspecified: Secondary | ICD-10-CM | POA: Diagnosis not present

## 2021-10-10 DIAGNOSIS — I152 Hypertension secondary to endocrine disorders: Secondary | ICD-10-CM | POA: Diagnosis not present

## 2021-10-10 NOTE — Progress Notes (Signed)
Chief Complaint  Patient presents with   Medicare Wellness    Nonfasting AWV today, CPE will be in April. No new concerns.    Tonya Rogers is a 85 y.o. female who presents for annual wellness visit and follow-up on chronic medical conditions See below for labs done prior to her visit.  Melanoma:  She has had numerous unresectable in-transit lesions, and in August was referred by Dr. Clovis Riley for immunotherapy.  She has multiple pigmented lesions on her R shin that continue to change. She has had 2 doses of nivolumab IV (October and November). She hasn't been able to make a fist with her L hand, and having some mouth issues (gum swelling, mouth pain. CT 06/2021: CONCLUSION:  1.  No definite evidence of metastatic disease in the chest, abdomen, or pelvis.  2.  Similar appearance of multiple bilateral pulmonary nodules.  3.  Similar nonspecific soft tissue nodularity of the subcutaneous ventral abdominal wall, which is nonspecific but may reflect sequela medication injection versus metastatic foci..  DM: Doing well on the Bydureon B-cise, pioglitazone and metformin.  Sugars have been controlled, with last A1c of 6.4% in April 2022. Sugars have been running 98-125 any time of day. Denies hypoglycemia, polydipsia or polyuria. Has some neuropathy--tingling in feet (only very slight tingling in hands), a mild burning, not painful or bothersome, unchanged/chronic.  Last eye exam was 06/2021, no retinopathy. She checks her feet regularly, no sores/concerns. She has microalbuminuria.  She is on Losartan.   Hyperlipidemia--She is compliant with her medications, taking 20mg  of simvastatin and 3000mg  of fish oil. Denies any side effects to the medications, and is following a lowfat, low cholesterol diet.   Lipids were at goal on last check: Lab Results  Component Value Date   CHOL 144 02/27/2021   HDL 74 02/27/2021   LDLCALC 57 02/27/2021   TRIG 63 02/27/2021   CHOLHDL 1.9 02/27/2021     Hypertension follow-up:  She is compliant with losartan 100mg , and denies side effects. She limits the sodium in her diet. Blood pressures elsewhere are running 128-150/68-80 (only occasionally >140, higher if eating more salt). She does some exercises at home, yardwork. Denies dizziness, headaches, chest pain, DOE, edema.  Hypothyroidism:  She denies any missed pills, taking the branded Synthroid.  She has always been crushing medication, mixing it with water, as she had trouble swallowing the pill when she tried swallowing it whole.  She has been taking it this way for many years. Denies any change in moods, bowels, temperature intolerance. She has had some issues with nail splitting over the years, but Biotin has helped. Dry skin mainly in winter. Constipation is managed well with prunes.   Osteoporosis:  She took fosamax x 5 years, stopped in 9030 due to jaw complications (jaw bones were "breaking apart"). She continues to take regular calcium, Vitamin D, and tries to get some weight-bearing exercise. Last bone density test 07/2019. T-2.2 at L femoral neck (decline from -1.9), T-2.2 at R femoral neck.  We discussed these results in detail at prior visit, that since she didn't tolerate alendronate due to jaw problems, Prolia may not be a good choice for her either. We discussed Evista, so she could research and decide if she wants to try this, vs recheck DEXA 07/2021. Plan was to wait to recheck, due now.  Immunization History  Administered Date(s) Administered   Fluad Quad(high Dose 65+) 07/20/2019   Influenza Split 08/26/2013, 09/08/2014, 08/17/2015   Influenza, High Dose Seasonal PF  09/12/2017, 08/06/2018, 08/13/2020, 08/18/2021   Influenza-Unspecified 08/20/2016   PFIZER Comirnaty(Gray Top)Covid-19 Tri-Sucrose Vaccine 03/28/2021   PFIZER(Purple Top)SARS-COV-2 Vaccination 01/07/2020, 02/01/2020, 09/13/2020   Pfizer Covid-19 Vaccine Bivalent Booster 81yrs & up 10/09/2021   Pneumococcal  Conjugate-13 08/26/2014   Pneumococcal Polysaccharide-23 12/27/2000, 07/27/2008, 04/03/2018   Td 03/26/2005, 11/28/2017   Tdap 03/05/2012   Zoster Recombinat (Shingrix) 02/11/2018, 08/06/2018   Zoster, Live 02/25/2011   Last Pap smear: 10/2013, negative, no high risk HPV present Last mammogram: 12/2020 Last colonoscopy: 8/06; negative Cologard 01/2016 and 02/2020 Last DEXA: 07/2019. T-2.2 at L femoral neck (decline from -1.9), T-2.2 at R femoral neck. Abnormal FRAX score of 22% major osteoporotic fracture, 6% hip fracture. Dentist: every 6 months Ophtho: yearly Exercise:  Walking in her [place], yard for 20 minutes 3-4x/week. She uses 2# weights 3-5x/week.  Also works in the yard. (Prior routine: gym 3-4x/week, treadmill, and a 30 min program with 8-10 machines, and bike, rowing machines. She is there 60-90 minutes, at least half is cardio.)   Patient Care Team: Rita Ohara, MD as PCP - General (Family Medicine) Dentist: Dr. Oren Binet at [facility] Ophtho: Dr. Gershon Crane ENT:  Dr. Benjamine Mola Dr[facility] Dub [facility] at [facility] for her melanoma (general surg) Local Dermatologist:  Dr. Pearline Cables at [place]:  [place] Oncologist: Dr. Baltazar Najjar (at [facility])  Depression Screening: [place] Office Visit from 10/11/2021 in [place]  PHQ-2 Total Score 0        Falls screen:  Fall Risk  10/11/2021 03/01/2021 09/01/2020 10/21/2019 07/20/2019  Falls in the past year? 0 0 0 1 0  Comment - - - Emmi Telephone Survey: data to providers prior to load -  Number falls in past yr: 0 0 - 1 -  Comment - - - Emmi Telephone Survey Actual Response = 1 -  Injury with Fall? 0 0 - 0 -  Risk for fall due to : No Fall Risks No Fall Risks - - -  Follow up Falls evaluation completed Falls evaluation completed - - -     Functional Status Survey: Is the patient deaf or have difficulty hearing?: Yes (worsened) Does the patient have difficulty seeing, even when wearing glasses/contacts?: Yes (wears glasses for  reading) Does the patient have difficulty concentrating, remembering, or making decisions?: Yes (short term memory) Does the patient have difficulty walking or climbing stairs?: No Does the patient have difficulty dressing or bathing?: No Does the patient have difficulty doing errands alone such as visiting a doctor's office or shopping?: No Doesn't find that her hearing aids help.  She plans to see [facility] to evaluate her earing (has excess noise, denies ringing).   Mini-Cog Scoring: 5     End of Life Discussion:  Patient has a living will and medical power of attorney   PMH, PSH, [place] and FH reviewed and updated  Outpatient Encounter Medications as of 10/11/2021  Medication Sig   BIOTIN PO Take 1 tablet by mouth daily.   BYDUREON BCISE 2 MG/0.85ML AUIJ INJECT 1 PEN SUBCUTANEOUSLY ONCE A WEEK   Calcium Carbonate-Vitamin D 600-200 MG-UNIT TABS Take 1 tablet by mouth daily.   Cholecalciferol (VITAMIN D) 1000 UNITS capsule Take 1,000 Units by mouth daily.   Coenzyme Q10 (CO Q 10) 100 MG CAPS Take 1 capsule by mouth daily.   fish oil-omega-3 fatty acids 1000 MG capsule Take 3 g by mouth daily.   gabapentin (NEURONTIN) 300 MG capsule Take 300 mg by mouth at bedtime.   glucose blood  test strip 1 each by Other route 2 (two) times daily. Test twice daily   losartan (COZAAR) 100 MG tablet Take one tablet daily   magnesium gluconate (MAGONATE) 500 MG tablet Take 500 mg by mouth daily.   metFORMIN (GLUCOPHAGE) 1000 MG tablet TAKE 1 TABLET BY MOUTH  TWICE DAILY WITH A MEAL   Multiple Vitamins-Minerals (MULTIVITAMIN WITH MINERALS) tablet Take 1 tablet by mouth daily.   pioglitazone (ACTOS) 15 MG tablet TAKE 1 TABLET BY MOUTH  DAILY   simvastatin (ZOCOR) 20 MG tablet Take 1 tablet (20 mg total) by mouth daily.   SYNTHROID 50 MCG tablet Take 1 tablet (50 mcg total) by mouth daily before breakfast.   vitamin C (ASCORBIC ACID) 500 MG tablet Take 500 mg by mouth daily.   [DISCONTINUED] Exenatide  ER 2 MG PEN Inject into the skin.   [DISCONTINUED] Insulin Pen Needle (BD PEN NEEDLE NANO U/F) 32G X 4 MM MISC USE AS DIRECTED WITH BYETTA(TWICE DAILY)   No facility-administered encounter medications on file as of 10/11/2021.   No Known Allergies   ROS:  The patient denies anorexia, fever, headaches,  vision changes, ear pain, sore throat, breast concerns, chest pain, palpitations, dizziness, syncope, dyspnea on exertion, cough, swelling, nausea, vomiting, diarrhea, abdominal pain, melena, hematochezia, indigestion/heartburn, hematuria, incontinence (just with cough/sneeze), dysuria,vaginal bleeding, discharge, odor or itch, genital lesions, joint pains, weakness, tremor, suspicious skin lesions, depression, anxiety, abnormal bleeding/bruising (mild, chronic), or enlarged lymph nodes. Constipation, unchanged/chronic--controlled Hearing loss bilaterally (R ear got worse, now even with the loss on her L). Has hearing aids--not using them; doesn't find them all that helpful. Sees Dr. Benjamine Mola yearly. +tingling/mild burning in feet, chronic, unchanged. Dry mouth. Still using fluoride trays at night. Having mouth and gum soreness from her immunotherapy. Up twice a night to void, unchanged.  Slight short-term memory issues (names), nothing that has progressed. Increased fatigue since starting immunotherapy. +intentional weight loss   PHYSICAL EXAM:  BP 122/70   Pulse 76   Ht 4' 11.5" (1.511 m)   Wt 161 lb 9.6 oz (73.3 kg)   BMI 32.09 kg/m   Wt Readings from Last 3 Encounters:  10/11/21 161 lb 9.6 oz (73.3 kg)  03/01/21 168 lb 9.6 oz (76.5 kg)  09/01/20 177 lb (80.3 kg)   Well developed, pleasant female, in good spirits HEENT: conjunctiva and sclera are clear, EOMI. Wearing mask Neck: no lymphadenopathy, thyromegaly or mass, no bruit Heart: regular rate and rhythm, murmur at RUSB unchanged Lungs: clear bilaterally Back: no CVA or spinal tenderness Abdomen: soft, nontender, no  organomegaly or mass Extremities: no edema, normal pulses. A few thickened onycomycotic nails (L>R). Normal monofilament exam. Dry skin on legs. Skin: Alopecia/thinning of hair diffusely on top of head, unchanged. 9 pigmented papules on RLE, linear along the inner R shin Psych: normal mood, affect, hygiene and grooming Neuro: alert and oriented, normal gait, using cane.  Lab Results  Component Value Date   HGBA1C 6.1 (H) 10/10/2021     Chemistry      Component Value Date/Time   NA 133 (L) 10/10/2021 1027   K 4.8 10/10/2021 1027   CL 91 (L) 10/10/2021 1027   CO2 27 10/10/2021 1027   BUN 13 10/10/2021 1027   CREATININE 0.87 10/10/2021 1027   CREATININE 0.99 (H) 09/16/2017 1004      Component Value Date/Time   CALCIUM 10.7 (H) 10/10/2021 1027   CALCIUM 9.7 04/23/2012 0915   ALKPHOS 85 10/10/2021 1027   AST 33  10/10/2021 1027   ALT 29 10/10/2021 1027   BILITOT 0.6 10/10/2021 1027     Fasting glu 122  Lab Results  Component Value Date   TSH 3.120 10/10/2021   Lab Results  Component Value Date   WBC 8.4 10/10/2021   HGB 14.1 10/10/2021   HCT 41.9 10/10/2021   MCV 80 10/10/2021   PLT 395 10/10/2021     ASSESSMENT/PLAN:  Medicare annual wellness visit, subsequent  Controlled type 2 diabetes mellitus with complication, without long-term current use of insulin (HCC) - cont current regimen  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 Cont statin  Osteopenia with high risk of fracture - Due for DEXA. Reviewed Ca, D and wt bearing exercise  Class 1 obesity due to excess calories with serious comorbidity and body mass index (BMI) of 32.0 to 32.9 in adult - comorbid--DM, HTN, HLD. Further wt loss  encouraged, reviewed proper diet, exercise  Aortic atherosclerosis (HCC), Chronic - cont statin   Discussed monthly self breast exams and yearly mammograms; at least 30 minutes of aerobic activity at least 5 days/week and weight-bearing exercise 2x/week; proper sunscreen use reviewed; healthy diet, including goals of calcium and vitamin D intake and alcohol recommendations (less than or equal to 1 drink/day) reviewed; regular seatbelt use; changing batteries in smoke detectors.  Immunization recommendations discussed--continue yearly flu shots. Colonoscopy recommendations reviewed, Cologuard UTD. Screening no longer needed (unless develops symptoms/bowel changes).  DEXA due now. Pap smears not needed due to age and low risk.   MOST form reviewed and updated, Full Code, Full care.  F/u as scheduled in April, for CPE If wants labs prior, will call to schedule and have [place] review [facility] labs prior to entering orders.

## 2021-10-10 NOTE — Patient Instructions (Addendum)
  Tonya Rogers , Thank you for taking time to come for your Medicare Wellness Visit. I appreciate your ongoing commitment to your health goals. Please review the following plan we discussed and let me know if I can assist you in the future.   This is a list of the screening recommended for you and due dates:  Health Maintenance  Topic Date Due   COVID-19 Vaccine (5 - Booster for Pfizer series) 05/23/2021   Flu Shot  06/26/2021   Hemoglobin A1C  08/29/2021   Complete foot exam   09/01/2021   Eye exam for diabetics  07/25/2022   Tetanus Vaccine  11/29/2027   Pneumonia Vaccine  Completed   DEXA scan (bone density measurement)  Completed   Zoster (Shingles) Vaccine  Completed   HPV Vaccine  Aged Out   Please contact [facility] and schedule your bone density test (2 year follow-up is due).

## 2021-10-11 ENCOUNTER — Ambulatory Visit (INDEPENDENT_AMBULATORY_CARE_PROVIDER_SITE_OTHER): Payer: Medicare Other | Admitting: Family Medicine

## 2021-10-11 ENCOUNTER — Encounter: Payer: Self-pay | Admitting: *Deleted

## 2021-10-11 ENCOUNTER — Encounter: Payer: Self-pay | Admitting: Family Medicine

## 2021-10-11 VITALS — BP 122/70 | HR 76 | Ht 59.5 in | Wt 161.6 lb

## 2021-10-11 DIAGNOSIS — E039 Hypothyroidism, unspecified: Secondary | ICD-10-CM | POA: Diagnosis not present

## 2021-10-11 DIAGNOSIS — I700 Atherosclerosis of aorta: Secondary | ICD-10-CM | POA: Diagnosis not present

## 2021-10-11 DIAGNOSIS — M8580 Other specified disorders of bone density and structure, unspecified site: Secondary | ICD-10-CM

## 2021-10-11 DIAGNOSIS — E118 Type 2 diabetes mellitus with unspecified complications: Secondary | ICD-10-CM

## 2021-10-11 DIAGNOSIS — E785 Hyperlipidemia, unspecified: Secondary | ICD-10-CM | POA: Diagnosis not present

## 2021-10-11 DIAGNOSIS — C779 Secondary and unspecified malignant neoplasm of lymph node, unspecified: Secondary | ICD-10-CM | POA: Diagnosis not present

## 2021-10-11 DIAGNOSIS — E6609 Other obesity due to excess calories: Secondary | ICD-10-CM

## 2021-10-11 DIAGNOSIS — I152 Hypertension secondary to endocrine disorders: Secondary | ICD-10-CM

## 2021-10-11 DIAGNOSIS — E1159 Type 2 diabetes mellitus with other circulatory complications: Secondary | ICD-10-CM | POA: Diagnosis not present

## 2021-10-11 DIAGNOSIS — E1169 Type 2 diabetes mellitus with other specified complication: Secondary | ICD-10-CM | POA: Diagnosis not present

## 2021-10-11 DIAGNOSIS — Z0000 Encounter for general adult medical examination without abnormal findings: Secondary | ICD-10-CM

## 2021-10-11 DIAGNOSIS — Z6832 Body mass index (BMI) 32.0-32.9, adult: Secondary | ICD-10-CM

## 2021-10-11 DIAGNOSIS — C439 Malignant melanoma of skin, unspecified: Secondary | ICD-10-CM | POA: Diagnosis not present

## 2021-10-11 LAB — CBC WITH DIFFERENTIAL/PLATELET
Basophils Absolute: 0.1 10*3/uL (ref 0.0–0.2)
Basos: 1 %
EOS (ABSOLUTE): 0.5 10*3/uL — ABNORMAL HIGH (ref 0.0–0.4)
Eos: 5 %
Hematocrit: 41.9 % (ref 34.0–46.6)
Hemoglobin: 14.1 g/dL (ref 11.1–15.9)
Immature Grans (Abs): 0 10*3/uL (ref 0.0–0.1)
Immature Granulocytes: 1 %
Lymphocytes Absolute: 1.4 10*3/uL (ref 0.7–3.1)
Lymphs: 17 %
MCH: 27 pg (ref 26.6–33.0)
MCHC: 33.7 g/dL (ref 31.5–35.7)
MCV: 80 fL (ref 79–97)
Monocytes Absolute: 0.9 10*3/uL (ref 0.1–0.9)
Monocytes: 10 %
Neutrophils Absolute: 5.6 10*3/uL (ref 1.4–7.0)
Neutrophils: 66 %
Platelets: 395 10*3/uL (ref 150–450)
RBC: 5.22 x10E6/uL (ref 3.77–5.28)
RDW: 14 % (ref 11.7–15.4)
WBC: 8.4 10*3/uL (ref 3.4–10.8)

## 2021-10-11 LAB — HEMOGLOBIN A1C
Est. average glucose Bld gHb Est-mCnc: 128 mg/dL
Hgb A1c MFr Bld: 6.1 % — ABNORMAL HIGH (ref 4.8–5.6)

## 2021-10-11 LAB — COMPREHENSIVE METABOLIC PANEL
ALT: 29 IU/L (ref 0–32)
AST: 33 IU/L (ref 0–40)
Albumin/Globulin Ratio: 1.8 (ref 1.2–2.2)
Albumin: 4.8 g/dL — ABNORMAL HIGH (ref 3.6–4.6)
Alkaline Phosphatase: 85 IU/L (ref 44–121)
BUN/Creatinine Ratio: 15 (ref 12–28)
BUN: 13 mg/dL (ref 8–27)
Bilirubin Total: 0.6 mg/dL (ref 0.0–1.2)
CO2: 27 mmol/L (ref 20–29)
Calcium: 10.7 mg/dL — ABNORMAL HIGH (ref 8.7–10.3)
Chloride: 91 mmol/L — ABNORMAL LOW (ref 96–106)
Creatinine, Ser: 0.87 mg/dL (ref 0.57–1.00)
Globulin, Total: 2.6 g/dL (ref 1.5–4.5)
Glucose: 122 mg/dL — ABNORMAL HIGH (ref 70–99)
Potassium: 4.8 mmol/L (ref 3.5–5.2)
Sodium: 133 mmol/L — ABNORMAL LOW (ref 134–144)
Total Protein: 7.4 g/dL (ref 6.0–8.5)
eGFR: 66 mL/min/{1.73_m2} (ref >59)

## 2021-10-11 LAB — TSH: TSH: 3.12 u[IU]/mL (ref 0.450–4.500)

## 2021-10-25 DIAGNOSIS — R2241 Localized swelling, mass and lump, right lower limb: Secondary | ICD-10-CM | POA: Diagnosis not present

## 2021-10-25 DIAGNOSIS — C439 Malignant melanoma of skin, unspecified: Secondary | ICD-10-CM | POA: Diagnosis not present

## 2021-10-27 DIAGNOSIS — R638 Other symptoms and signs concerning food and fluid intake: Secondary | ICD-10-CM | POA: Diagnosis not present

## 2021-10-27 DIAGNOSIS — C439 Malignant melanoma of skin, unspecified: Secondary | ICD-10-CM | POA: Diagnosis not present

## 2021-10-27 DIAGNOSIS — R946 Abnormal results of thyroid function studies: Secondary | ICD-10-CM | POA: Diagnosis not present

## 2021-10-27 DIAGNOSIS — R918 Other nonspecific abnormal finding of lung field: Secondary | ICD-10-CM | POA: Diagnosis not present

## 2021-10-27 DIAGNOSIS — E871 Hypo-osmolality and hyponatremia: Secondary | ICD-10-CM | POA: Diagnosis not present

## 2021-10-27 DIAGNOSIS — C779 Secondary and unspecified malignant neoplasm of lymph node, unspecified: Secondary | ICD-10-CM | POA: Diagnosis not present

## 2021-10-27 DIAGNOSIS — Z9889 Other specified postprocedural states: Secondary | ICD-10-CM | POA: Diagnosis not present

## 2021-10-27 DIAGNOSIS — K1230 Oral mucositis (ulcerative), unspecified: Secondary | ICD-10-CM | POA: Diagnosis not present

## 2021-10-27 DIAGNOSIS — C4371 Malignant melanoma of right lower limb, including hip: Secondary | ICD-10-CM | POA: Diagnosis not present

## 2021-10-27 DIAGNOSIS — Z79899 Other long term (current) drug therapy: Secondary | ICD-10-CM | POA: Diagnosis not present

## 2021-10-31 DIAGNOSIS — L570 Actinic keratosis: Secondary | ICD-10-CM | POA: Diagnosis not present

## 2021-10-31 DIAGNOSIS — D492 Neoplasm of unspecified behavior of bone, soft tissue, and skin: Secondary | ICD-10-CM | POA: Diagnosis not present

## 2021-10-31 DIAGNOSIS — C792 Secondary malignant neoplasm of skin: Secondary | ICD-10-CM | POA: Diagnosis not present

## 2021-10-31 DIAGNOSIS — L814 Other melanin hyperpigmentation: Secondary | ICD-10-CM | POA: Diagnosis not present

## 2021-10-31 DIAGNOSIS — Z85820 Personal history of malignant melanoma of skin: Secondary | ICD-10-CM | POA: Diagnosis not present

## 2021-10-31 DIAGNOSIS — L821 Other seborrheic keratosis: Secondary | ICD-10-CM | POA: Diagnosis not present

## 2021-10-31 DIAGNOSIS — D225 Melanocytic nevi of trunk: Secondary | ICD-10-CM | POA: Diagnosis not present

## 2021-10-31 DIAGNOSIS — C4371 Malignant melanoma of right lower limb, including hip: Secondary | ICD-10-CM | POA: Diagnosis not present

## 2021-10-31 DIAGNOSIS — C439 Malignant melanoma of skin, unspecified: Secondary | ICD-10-CM | POA: Diagnosis not present

## 2021-10-31 DIAGNOSIS — Z08 Encounter for follow-up examination after completed treatment for malignant neoplasm: Secondary | ICD-10-CM | POA: Diagnosis not present

## 2021-11-08 DIAGNOSIS — C4371 Malignant melanoma of right lower limb, including hip: Secondary | ICD-10-CM | POA: Diagnosis not present

## 2021-11-08 DIAGNOSIS — C779 Secondary and unspecified malignant neoplasm of lymph node, unspecified: Secondary | ICD-10-CM | POA: Diagnosis not present

## 2021-11-08 DIAGNOSIS — Z5112 Encounter for antineoplastic immunotherapy: Secondary | ICD-10-CM | POA: Diagnosis not present

## 2021-11-08 DIAGNOSIS — Z79899 Other long term (current) drug therapy: Secondary | ICD-10-CM | POA: Diagnosis not present

## 2021-11-10 ENCOUNTER — Other Ambulatory Visit: Payer: Self-pay | Admitting: Family Medicine

## 2021-11-10 DIAGNOSIS — I10 Essential (primary) hypertension: Secondary | ICD-10-CM

## 2021-11-15 DIAGNOSIS — R053 Chronic cough: Secondary | ICD-10-CM | POA: Diagnosis not present

## 2021-11-15 DIAGNOSIS — H903 Sensorineural hearing loss, bilateral: Secondary | ICD-10-CM | POA: Diagnosis not present

## 2021-12-15 DIAGNOSIS — L853 Xerosis cutis: Secondary | ICD-10-CM | POA: Diagnosis not present

## 2021-12-15 DIAGNOSIS — Z9889 Other specified postprocedural states: Secondary | ICD-10-CM | POA: Diagnosis not present

## 2021-12-15 DIAGNOSIS — C779 Secondary and unspecified malignant neoplasm of lymph node, unspecified: Secondary | ICD-10-CM | POA: Diagnosis not present

## 2021-12-15 DIAGNOSIS — R946 Abnormal results of thyroid function studies: Secondary | ICD-10-CM | POA: Diagnosis not present

## 2021-12-15 DIAGNOSIS — C4371 Malignant melanoma of right lower limb, including hip: Secondary | ICD-10-CM | POA: Diagnosis not present

## 2021-12-15 DIAGNOSIS — K1230 Oral mucositis (ulcerative), unspecified: Secondary | ICD-10-CM | POA: Diagnosis not present

## 2021-12-15 DIAGNOSIS — Z79899 Other long term (current) drug therapy: Secondary | ICD-10-CM | POA: Diagnosis not present

## 2021-12-15 DIAGNOSIS — E871 Hypo-osmolality and hyponatremia: Secondary | ICD-10-CM | POA: Diagnosis not present

## 2021-12-15 DIAGNOSIS — R918 Other nonspecific abnormal finding of lung field: Secondary | ICD-10-CM | POA: Diagnosis not present

## 2021-12-15 DIAGNOSIS — M19049 Primary osteoarthritis, unspecified hand: Secondary | ICD-10-CM | POA: Diagnosis not present

## 2021-12-15 DIAGNOSIS — Z5112 Encounter for antineoplastic immunotherapy: Secondary | ICD-10-CM | POA: Diagnosis not present

## 2021-12-15 DIAGNOSIS — R638 Other symptoms and signs concerning food and fluid intake: Secondary | ICD-10-CM | POA: Diagnosis not present

## 2021-12-15 DIAGNOSIS — M1990 Unspecified osteoarthritis, unspecified site: Secondary | ICD-10-CM | POA: Diagnosis not present

## 2021-12-16 ENCOUNTER — Other Ambulatory Visit: Payer: Self-pay | Admitting: Family Medicine

## 2021-12-16 DIAGNOSIS — E039 Hypothyroidism, unspecified: Secondary | ICD-10-CM

## 2022-01-12 DIAGNOSIS — Z9889 Other specified postprocedural states: Secondary | ICD-10-CM | POA: Diagnosis not present

## 2022-01-12 DIAGNOSIS — C4371 Malignant melanoma of right lower limb, including hip: Secondary | ICD-10-CM | POA: Diagnosis not present

## 2022-01-12 DIAGNOSIS — Z79899 Other long term (current) drug therapy: Secondary | ICD-10-CM | POA: Diagnosis not present

## 2022-01-12 DIAGNOSIS — C439 Malignant melanoma of skin, unspecified: Secondary | ICD-10-CM | POA: Diagnosis not present

## 2022-01-12 DIAGNOSIS — M1990 Unspecified osteoarthritis, unspecified site: Secondary | ICD-10-CM | POA: Diagnosis not present

## 2022-01-12 DIAGNOSIS — K1230 Oral mucositis (ulcerative), unspecified: Secondary | ICD-10-CM | POA: Diagnosis not present

## 2022-01-12 DIAGNOSIS — C779 Secondary and unspecified malignant neoplasm of lymph node, unspecified: Secondary | ICD-10-CM | POA: Diagnosis not present

## 2022-01-12 DIAGNOSIS — Z5112 Encounter for antineoplastic immunotherapy: Secondary | ICD-10-CM | POA: Diagnosis not present

## 2022-01-12 DIAGNOSIS — R946 Abnormal results of thyroid function studies: Secondary | ICD-10-CM | POA: Diagnosis not present

## 2022-01-12 DIAGNOSIS — R918 Other nonspecific abnormal finding of lung field: Secondary | ICD-10-CM | POA: Diagnosis not present

## 2022-01-12 DIAGNOSIS — R053 Chronic cough: Secondary | ICD-10-CM | POA: Diagnosis not present

## 2022-02-06 ENCOUNTER — Other Ambulatory Visit: Payer: Self-pay | Admitting: Family Medicine

## 2022-02-06 DIAGNOSIS — E7800 Pure hypercholesterolemia, unspecified: Secondary | ICD-10-CM

## 2022-02-06 DIAGNOSIS — Z5181 Encounter for therapeutic drug level monitoring: Secondary | ICD-10-CM

## 2022-02-06 NOTE — Telephone Encounter (Signed)
See if she has enough to last until her visit. If she doesn't, okay for 90d ?

## 2022-02-07 DIAGNOSIS — C778 Secondary and unspecified malignant neoplasm of lymph nodes of multiple regions: Secondary | ICD-10-CM | POA: Diagnosis not present

## 2022-02-07 DIAGNOSIS — C4371 Malignant melanoma of right lower limb, including hip: Secondary | ICD-10-CM | POA: Diagnosis not present

## 2022-02-07 DIAGNOSIS — C779 Secondary and unspecified malignant neoplasm of lymph node, unspecified: Secondary | ICD-10-CM | POA: Diagnosis not present

## 2022-02-07 DIAGNOSIS — C439 Malignant melanoma of skin, unspecified: Secondary | ICD-10-CM | POA: Diagnosis not present

## 2022-02-09 DIAGNOSIS — Z5112 Encounter for antineoplastic immunotherapy: Secondary | ICD-10-CM | POA: Diagnosis not present

## 2022-02-09 DIAGNOSIS — E039 Hypothyroidism, unspecified: Secondary | ICD-10-CM | POA: Diagnosis not present

## 2022-02-09 DIAGNOSIS — R946 Abnormal results of thyroid function studies: Secondary | ICD-10-CM | POA: Diagnosis not present

## 2022-02-09 DIAGNOSIS — M1990 Unspecified osteoarthritis, unspecified site: Secondary | ICD-10-CM | POA: Diagnosis not present

## 2022-02-09 DIAGNOSIS — K1230 Oral mucositis (ulcerative), unspecified: Secondary | ICD-10-CM | POA: Diagnosis not present

## 2022-02-09 DIAGNOSIS — R918 Other nonspecific abnormal finding of lung field: Secondary | ICD-10-CM | POA: Diagnosis not present

## 2022-02-09 DIAGNOSIS — Z79899 Other long term (current) drug therapy: Secondary | ICD-10-CM | POA: Diagnosis not present

## 2022-02-09 DIAGNOSIS — C4371 Malignant melanoma of right lower limb, including hip: Secondary | ICD-10-CM | POA: Diagnosis not present

## 2022-02-09 DIAGNOSIS — Z9889 Other specified postprocedural states: Secondary | ICD-10-CM | POA: Diagnosis not present

## 2022-02-09 DIAGNOSIS — C779 Secondary and unspecified malignant neoplasm of lymph node, unspecified: Secondary | ICD-10-CM | POA: Diagnosis not present

## 2022-02-09 NOTE — Telephone Encounter (Signed)
Pt states she needs 90 of metformin to be sent in as she will run out. She thinks she is ok on the other 2 meds ?

## 2022-02-12 DIAGNOSIS — Z1231 Encounter for screening mammogram for malignant neoplasm of breast: Secondary | ICD-10-CM | POA: Diagnosis not present

## 2022-02-16 ENCOUNTER — Other Ambulatory Visit: Payer: Self-pay | Admitting: Family Medicine

## 2022-02-16 DIAGNOSIS — I10 Essential (primary) hypertension: Secondary | ICD-10-CM

## 2022-02-19 DIAGNOSIS — R922 Inconclusive mammogram: Secondary | ICD-10-CM | POA: Diagnosis not present

## 2022-02-19 DIAGNOSIS — R928 Other abnormal and inconclusive findings on diagnostic imaging of breast: Secondary | ICD-10-CM | POA: Diagnosis not present

## 2022-02-19 LAB — HM MAMMOGRAPHY

## 2022-02-22 ENCOUNTER — Encounter: Payer: Self-pay | Admitting: *Deleted

## 2022-02-23 ENCOUNTER — Encounter: Payer: Self-pay | Admitting: Family Medicine

## 2022-02-26 ENCOUNTER — Other Ambulatory Visit: Payer: Medicare Other

## 2022-02-26 ENCOUNTER — Telehealth: Payer: Self-pay | Admitting: Family Medicine

## 2022-02-26 ENCOUNTER — Other Ambulatory Visit: Payer: Self-pay | Admitting: Family Medicine

## 2022-02-26 DIAGNOSIS — E785 Hyperlipidemia, unspecified: Secondary | ICD-10-CM | POA: Diagnosis not present

## 2022-02-26 DIAGNOSIS — E118 Type 2 diabetes mellitus with unspecified complications: Secondary | ICD-10-CM | POA: Diagnosis not present

## 2022-02-26 DIAGNOSIS — E1169 Type 2 diabetes mellitus with other specified complication: Secondary | ICD-10-CM

## 2022-02-26 NOTE — Telephone Encounter (Signed)
Pt dropped off form put in your folder ? ?

## 2022-02-26 NOTE — Telephone Encounter (Signed)
Paperwork on Nivolumab received.  She included notes stating that this has affected her mouth, fingers on both hands (R>L), nails have turned white. She noted she was given a mouth wash. ?

## 2022-02-27 LAB — LIPID PANEL
Chol/HDL Ratio: 2 ratio (ref 0.0–4.4)
Cholesterol, Total: 165 mg/dL (ref 100–199)
HDL: 83 mg/dL (ref >39)
LDL Chol Calc (NIH): 69 mg/dL (ref 0–99)
Triglycerides: 69 mg/dL (ref 0–149)
VLDL Cholesterol Cal: 13 mg/dL (ref 5–40)

## 2022-02-27 LAB — HEMOGLOBIN A1C
Est. average glucose Bld gHb Est-mCnc: 128 mg/dL
Hgb A1c MFr Bld: 6.1 % — ABNORMAL HIGH (ref 4.8–5.6)

## 2022-02-27 LAB — MICROALBUMIN / CREATININE URINE RATIO
Creatinine, Urine: 96.8 mg/dL
Microalb/Creat Ratio: 154 mg/g creat — ABNORMAL HIGH (ref 0–29)
Microalbumin, Urine: 148.6 ug/mL

## 2022-02-27 NOTE — Progress Notes (Signed)
?Chief Complaint  ?Patient presents with  ? Medicare Wellness  ?  Nonfasting AWV, no new concerns. Has not yet had DEXA, will schedule and will get Tdap when she can.   ? ?Tonya Rogers is a 86 y.o. female who presents for follow-up on chronic medical conditions, and for breast/pelvic exam (not done at her AWV in November). She had labs done prior to her visit, see below. ?She is accompanied by her daughter today. ? ?Melanoma:  She has had numerous unresectable in-transit lesions, and in August was referred by Dr. Clovis Riley for immunotherapy.  She has multiple pigmented lesions on her R shin that continued to change.  She completed 6 infusions of nivolumab.  ?PET scan showed new soft tissue nodule within anterior aspect of R mid-thigh, and new small superficial soft tissue nodule along medial surface of R leg at level of mid-femur.  Due to lack of response to nivolumab, plan is to change treatment.  She had mucositis (treated with rx mouthwash, and improving), arthritis (mainly in hands) as toxicity from this treatment.  The plan is to change treatment to encorafenib and binimetinib, waiting on insurance approval and then will follow-up with them 2 weeks after starting to f/u on toxicity/SE. ?They have noted mild increase in TSH and FT4 on levothyroxine, clinically euthyroid; they did not change her thyroid medication (have been monitoring it monthly). ? ? ?DM: Doing well on the Bydureon B-cise, pioglitazone and metformin.  A1c was 6.1% in 09/2021. ?Sugars are not checked often, up to 138, lowest being 95. ?Denies hypoglycemia, polydipsia or polyuria. Has some neuropathy--tingling in feet has been chronic, has been having more tingling in her hands since the chemo treatments for melanoma. Not painful. ?Last eye exam was 06/2021, no retinopathy. She checks her feet regularly, no sores/concerns. ?She has microalbuminuria.  She is on Losartan. ?  ?Hyperlipidemia--She is compliant with her medications, taking '20mg'$  of  simvastatin and '3000mg'$  of fish oil. Denies any side effects to the medications, and is following a lowfat, low cholesterol diet.    ? ?Hypertension follow-up:  She is compliant with losartan '100mg'$ , and denies side effects. She limits the sodium in her diet. ?Blood pressures elsewhere are running okay (maybe higher initially at the oncologist, but comes down on recheck). ?Denies dizziness, headaches, chest pain, DOE, edema. ?BP Readings from Last 3 Encounters:  ?02/28/22 130/70  ?10/11/21 122/70  ?03/01/21 132/78  ? ? ?Hypothyroidism:  She denies any missed pills, taking the branded Synthroid.  She has always been crushing medication, mixing it with water, as she had trouble swallowing the pill when she tried swallowing it whole.  She has been taking it this way for many years. ?Denies any change in moods, temperature intolerance. She has had some issues with nail splitting over the years, but Biotin has helped. Dry skin mainly in winter. Constipation had been managed well with prunes, but today she reports her constipation is getting worse. ?She has had worsening hair loss. ?She has been getting thyroid studies checked monthly per oncologist, the last being done 02/09/22: ? ? Ref Range & Units 2 wk ago  ?TSH 0.45 - 5.00 UIU/ML 5.95 High    ? ?T4 TOTAL 5.5 - 11.8 mcg/dL 11.6   ?T3 Uptake 32.0 - 48.4 % 41.8   ?Free Thyroxine Index 4.8 - 11.3 12.4 High    ? ? Ref Range & Units 2 wk ago  ?FREE T4 0.6 - 1.3 NG/DL 2.1 High    ? ? ?Osteoporosis:  She took fosamax x 5 years, stopped in 8676 due to jaw complications (jaw bones were "breaking apart"). She continues to take regular calcium, Vitamin D. Hasn't gotten any weight-bearing exercise in the last 6 months or so.  Last bone density test 07/2019. T-2.2 at L femoral neck (decline from -1.9), T-2.2 at R femoral neck.  ?The plan was to recheck DEXA 07/2021.  This hasn't been done yet. ? ?Immunization History  ?Administered Date(s) Administered  ? Fluad Quad(high Dose 65+)  07/20/2019  ? Influenza Split 08/26/2013, 09/08/2014, 08/17/2015  ? Influenza, High Dose Seasonal PF 09/12/2017, 08/06/2018, 08/13/2020, 08/18/2021  ? Influenza-Unspecified 08/20/2016  ? PFIZER Comirnaty(Gray Top)Covid-19 Tri-Sucrose Vaccine 03/28/2021  ? PFIZER(Purple Top)SARS-COV-2 Vaccination 01/07/2020, 02/01/2020, 09/13/2020  ? Pension scheme manager 20yr & up 10/09/2021  ? Pneumococcal Conjugate-13 08/26/2014  ? Pneumococcal Polysaccharide-23 12/27/2000, 07/27/2008, 04/03/2018  ? Td 03/26/2005, 11/28/2017  ? Tdap 03/05/2012  ? Zoster Recombinat (Shingrix) 02/11/2018, 08/06/2018  ? Zoster, Live 02/25/2011  ? ?Last Pap smear: 10/2013, negative, no high risk HPV present ?Last mammogram: 01/2022 ?Last colonoscopy: 8/06; negative Cologard 01/2016 and 02/2020 ?Last DEXA: 07/2019. T-2.2 at L femoral neck (decline from -1.9), T-2.2 at R femoral neck. Abnormal FRAX score of 22% major osteoporotic fracture, 6% hip fracture. ?Dentist: every 6 months ?Ophtho: yearly ?Exercise: Walks in her house (no longer outside), and around the yard. ?She used to use 2# weights ,not recently. ?  ? ?PMH, PSH, SH and FH reviewed and updated ? ? ?Outpatient Encounter Medications as of 02/28/2022  ?Medication Sig Note  ? BIOTIN PO Take 1 tablet by mouth daily.   ? BYDUREON BCISE 2 MG/0.85ML AUIJ INJECT 1 PEN SUBCUTANEOUSLY ONCE A WEEK   ? Calcium Carbonate-Vitamin D 600-200 MG-UNIT TABS Take 1 tablet by mouth daily.   ? Cholecalciferol (VITAMIN D) 1000 UNITS capsule Take 1,000 Units by mouth daily.   ? Coenzyme Q10 (CO Q 10) 100 MG CAPS Take 1 capsule by mouth daily.   ? fish oil-omega-3 fatty acids 1000 MG capsule Take 3 g by mouth daily.   ? glucose blood test strip 1 each by Other route 2 (two) times daily. Test twice daily   ? losartan (COZAAR) 100 MG tablet TAKE 1 TABLET BY MOUTH DAILY   ? magic mouthwash w/lidocaine SOLN Take 30 mLs by mouth. 02/28/2022: Using 4-5 times daily  ? magnesium gluconate (MAGONATE) 500 MG tablet  Take 500 mg by mouth daily.   ? metFORMIN (GLUCOPHAGE) 1000 MG tablet TAKE 1 TABLET BY MOUTH  TWICE DAILY WITH MEALS   ? Multiple Vitamins-Minerals (MULTIVITAMIN WITH MINERALS) tablet Take 1 tablet by mouth daily.   ? pioglitazone (ACTOS) 15 MG tablet TAKE 1 TABLET BY MOUTH  DAILY   ? simvastatin (ZOCOR) 20 MG tablet Take 1 tablet (20 mg total) by mouth daily.   ? SYNTHROID 50 MCG tablet TAKE 1 TABLET DAILY BEFORE BREAKFAST FOR HYPOTHYROIDISM   ? vitamin C (ASCORBIC ACID) 500 MG tablet Take 500 mg by mouth daily.   ? [DISCONTINUED] gabapentin (NEURONTIN) 300 MG capsule Take 300 mg by mouth at bedtime. (Patient not taking: Reported on 02/28/2022)   ? ?No facility-administered encounter medications on file as of 02/28/2022.  ? ?No Known Allergies ? ? ?ROS:  The patient denies anorexia, fever, headaches,  vision changes, ear pain, breast concerns, chest pain, palpitations, dizziness, syncope, dyspnea on exertion, cough, swelling, nausea, vomiting, diarrhea, abdominal pain, melena, hematochezia, indigestion/heartburn, hematuria, incontinence (just with cough/sneeze), dysuria,vaginal bleeding, discharge, odor or  itch, genital lesions, joint pains, weakness, tremor, depression, anxiety, abnormal bleeding/bruising (mild, chronic), or enlarged lymph nodes. ?Constipation has worsened. ?Hearing loss bilaterally. Has hearing aids--not using them; doesn't find them all that helpful. Sees Dr. Benjamine Mola yearly. ?+tingling/mild burning in feet and now also in her fingers, not bad enough to want treatment. ?Mouth/gum soreness/mucositis related to immunotherapy. Magic mouthwash helps. ?Up once a night to void, unchanged.  ?Slight short-term memory issues (names), nothing that has progressed. ?+weight loss--multifactorial, much of it is due to mouth pain and loss of taste related to melanoma treatment. Weight loss was intentional at first, prior to mouth pain. ?Stiffness in L fingers, hard to close hand/make a fist. ?Denies joint pains. ?Saw  Dr. Pearline Cables last week, something removed from upper R posterior arm, path pending. ? ? ?PHYSICAL EXAM: ? ?BP 130/70   Pulse 80   Ht 4' 11.5" (1.511 m)   Wt 147 lb 3.2 oz (66.8 kg)   BMI 29.23 kg/m?  ? ?Wt Re

## 2022-02-27 NOTE — Patient Instructions (Addendum)
?  HEALTH MAINTENANCE RECOMMENDATIONS: ? ?It is recommended that you get at least 30 minutes of aerobic exercise at least 5 days/week (for weight loss, you may need as much as 60-90 minutes). This can be any activity that gets your heart rate up. This can be divided in 10-15 minute intervals if needed, but try and build up your endurance at least once a week.  Weight bearing exercise is also recommended twice weekly. ? ?Eat a healthy diet with lots of vegetables, fruits and fiber.  "Colorful" foods have a lot of vitamins (ie green vegetables, tomatoes, red peppers, etc).  Limit sweet tea, regular sodas and alcoholic beverages, all of which has a lot of calories and sugar.  Up to 1 alcoholic drink daily may be beneficial for women (unless trying to lose weight, watch sugars).  Drink a lot of water. ? ?Calcium recommendations are 1200-1500 mg daily (1500 mg for postmenopausal women or women without ovaries), and vitamin D 1000 IU daily.  This should be obtained from diet and/or supplements (vitamins), and calcium should not be taken all at once, but in divided doses. ? ?Monthly self breast exams and yearly mammograms for women over the age of 19 is recommended. ? ?Sunscreen of at least SPF 30 should be used on all sun-exposed parts of the skin when outside between the hours of 10 am and 4 pm (not just when at beach or pool, but even with exercise, golf, tennis, and yard work!)  Use a sunscreen that says "broad spectrum" so it covers both UVA and UVB rays, and make sure to reapply every 1-2 hours. ? ?Remember to change the batteries in your smoke detectors when changing your clock times in the spring and fall. Carbon monoxide detectors are recommended for your home. ? ?Use your seat belt every time you are in a car, and please drive safely and not be distracted with cell phones and texting while driving. ? ?Please schedule bone density test at [facility] (they will fax [place] an order to sign). ? ?Please get a tetanus booster  (TdaP) from the pharmacy. Please double check with your oncologist if there are any timing issues regarding when to best get this vaccine related to your infusions. ? ?Please try stopping the Biotin a week before the thyroid labs done by the oncologist (in case that is contributing to some of the unusual thyroid results findings. ? ?Let us know if you change your mind and are interested in seeing an endocrinologist to help figure this out (especially in light of worsening constipation, and weight loss). ? ?We discussed constipation--possibly adding a fiber supplement, continuing your water intake (ensuring at least 64 ounces daily), exercise and fiber-rich foods. ?We discussed possibly using colace '200mg'$  every day. ?If your bowels are not well regulated with these measures, you can start taking Miralax 1/2 -1 capful daily. ? ? ? ? ?

## 2022-02-28 ENCOUNTER — Ambulatory Visit (INDEPENDENT_AMBULATORY_CARE_PROVIDER_SITE_OTHER): Payer: Medicare Other | Admitting: Family Medicine

## 2022-02-28 ENCOUNTER — Encounter: Payer: Self-pay | Admitting: Family Medicine

## 2022-02-28 VITALS — BP 130/70 | HR 80 | Ht 59.5 in | Wt 147.2 lb

## 2022-02-28 DIAGNOSIS — E1159 Type 2 diabetes mellitus with other circulatory complications: Secondary | ICD-10-CM

## 2022-02-28 DIAGNOSIS — E039 Hypothyroidism, unspecified: Secondary | ICD-10-CM | POA: Diagnosis not present

## 2022-02-28 DIAGNOSIS — E118 Type 2 diabetes mellitus with unspecified complications: Secondary | ICD-10-CM

## 2022-02-28 DIAGNOSIS — M8580 Other specified disorders of bone density and structure, unspecified site: Secondary | ICD-10-CM

## 2022-02-28 DIAGNOSIS — I700 Atherosclerosis of aorta: Secondary | ICD-10-CM

## 2022-02-28 DIAGNOSIS — E785 Hyperlipidemia, unspecified: Secondary | ICD-10-CM

## 2022-02-28 DIAGNOSIS — I10 Essential (primary) hypertension: Secondary | ICD-10-CM | POA: Diagnosis not present

## 2022-02-28 DIAGNOSIS — E1169 Type 2 diabetes mellitus with other specified complication: Secondary | ICD-10-CM

## 2022-02-28 DIAGNOSIS — R809 Proteinuria, unspecified: Secondary | ICD-10-CM | POA: Diagnosis not present

## 2022-02-28 DIAGNOSIS — K5900 Constipation, unspecified: Secondary | ICD-10-CM

## 2022-02-28 DIAGNOSIS — E7800 Pure hypercholesterolemia, unspecified: Secondary | ICD-10-CM | POA: Diagnosis not present

## 2022-02-28 DIAGNOSIS — C439 Malignant melanoma of skin, unspecified: Secondary | ICD-10-CM

## 2022-02-28 DIAGNOSIS — E1149 Type 2 diabetes mellitus with other diabetic neurological complication: Secondary | ICD-10-CM | POA: Diagnosis not present

## 2022-02-28 DIAGNOSIS — C779 Secondary and unspecified malignant neoplasm of lymph node, unspecified: Secondary | ICD-10-CM | POA: Diagnosis not present

## 2022-02-28 DIAGNOSIS — I152 Hypertension secondary to endocrine disorders: Secondary | ICD-10-CM

## 2022-02-28 DIAGNOSIS — Z5181 Encounter for therapeutic drug level monitoring: Secondary | ICD-10-CM

## 2022-02-28 MED ORDER — LOSARTAN POTASSIUM 100 MG PO TABS: ORAL_TABLET | ORAL | 0 refills | Status: DC

## 2022-02-28 MED ORDER — SIMVASTATIN 20 MG PO TABS: 20.0000 mg | ORAL_TABLET | Freq: Every day | ORAL | 3 refills | Status: DC

## 2022-02-28 MED ORDER — PIOGLITAZONE HCL 15 MG PO TABS: 15.0000 mg | ORAL_TABLET | Freq: Every day | ORAL | 1 refills | Status: DC

## 2022-02-28 MED ORDER — SYNTHROID 50 MCG PO TABS: ORAL_TABLET | ORAL | 1 refills | Status: DC

## 2022-03-20 DIAGNOSIS — K633 Ulcer of intestine: Secondary | ICD-10-CM | POA: Diagnosis not present

## 2022-03-20 DIAGNOSIS — Z20822 Contact with and (suspected) exposure to covid-19: Secondary | ICD-10-CM | POA: Diagnosis not present

## 2022-03-20 DIAGNOSIS — I9589 Other hypotension: Secondary | ICD-10-CM | POA: Diagnosis not present

## 2022-03-20 DIAGNOSIS — E7800 Pure hypercholesterolemia, unspecified: Secondary | ICD-10-CM | POA: Diagnosis not present

## 2022-03-20 DIAGNOSIS — D638 Anemia in other chronic diseases classified elsewhere: Secondary | ICD-10-CM | POA: Diagnosis not present

## 2022-03-20 DIAGNOSIS — R509 Fever, unspecified: Secondary | ICD-10-CM | POA: Diagnosis not present

## 2022-03-20 DIAGNOSIS — D509 Iron deficiency anemia, unspecified: Secondary | ICD-10-CM | POA: Diagnosis not present

## 2022-03-20 DIAGNOSIS — K6389 Other specified diseases of intestine: Secondary | ICD-10-CM | POA: Diagnosis not present

## 2022-03-20 DIAGNOSIS — R000 Tachycardia, unspecified: Secondary | ICD-10-CM | POA: Diagnosis not present

## 2022-03-20 DIAGNOSIS — E119 Type 2 diabetes mellitus without complications: Secondary | ICD-10-CM | POA: Diagnosis not present

## 2022-03-20 DIAGNOSIS — K5289 Other specified noninfective gastroenteritis and colitis: Secondary | ICD-10-CM | POA: Diagnosis not present

## 2022-03-20 DIAGNOSIS — C779 Secondary and unspecified malignant neoplasm of lymph node, unspecified: Secondary | ICD-10-CM | POA: Diagnosis not present

## 2022-03-20 DIAGNOSIS — Z79899 Other long term (current) drug therapy: Secondary | ICD-10-CM | POA: Diagnosis not present

## 2022-03-20 DIAGNOSIS — D649 Anemia, unspecified: Secondary | ICD-10-CM | POA: Diagnosis not present

## 2022-03-20 DIAGNOSIS — K921 Melena: Secondary | ICD-10-CM | POA: Diagnosis not present

## 2022-03-20 DIAGNOSIS — E861 Hypovolemia: Secondary | ICD-10-CM | POA: Diagnosis not present

## 2022-03-20 DIAGNOSIS — Z9889 Other specified postprocedural states: Secondary | ICD-10-CM | POA: Diagnosis not present

## 2022-03-20 DIAGNOSIS — K5730 Diverticulosis of large intestine without perforation or abscess without bleeding: Secondary | ICD-10-CM | POA: Diagnosis not present

## 2022-03-20 DIAGNOSIS — I10 Essential (primary) hypertension: Secondary | ICD-10-CM | POA: Diagnosis not present

## 2022-03-20 DIAGNOSIS — E039 Hypothyroidism, unspecified: Secondary | ICD-10-CM | POA: Diagnosis not present

## 2022-03-20 DIAGNOSIS — B3781 Candidal esophagitis: Secondary | ICD-10-CM | POA: Diagnosis not present

## 2022-03-20 DIAGNOSIS — D62 Acute posthemorrhagic anemia: Secondary | ICD-10-CM | POA: Diagnosis not present

## 2022-03-20 DIAGNOSIS — C4371 Malignant melanoma of right lower limb, including hip: Secondary | ICD-10-CM | POA: Diagnosis not present

## 2022-03-20 DIAGNOSIS — B370 Candidal stomatitis: Secondary | ICD-10-CM | POA: Diagnosis not present

## 2022-03-20 DIAGNOSIS — K5521 Angiodysplasia of colon with hemorrhage: Secondary | ICD-10-CM | POA: Diagnosis not present

## 2022-03-20 DIAGNOSIS — K529 Noninfective gastroenteritis and colitis, unspecified: Secondary | ICD-10-CM | POA: Diagnosis not present

## 2022-03-20 DIAGNOSIS — Z8249 Family history of ischemic heart disease and other diseases of the circulatory system: Secondary | ICD-10-CM | POA: Diagnosis not present

## 2022-03-20 DIAGNOSIS — K922 Gastrointestinal hemorrhage, unspecified: Secondary | ICD-10-CM | POA: Diagnosis not present

## 2022-03-20 DIAGNOSIS — Z7985 Long-term (current) use of injectable non-insulin antidiabetic drugs: Secondary | ICD-10-CM | POA: Diagnosis not present

## 2022-03-20 DIAGNOSIS — C439 Malignant melanoma of skin, unspecified: Secondary | ICD-10-CM | POA: Diagnosis not present

## 2022-03-22 DIAGNOSIS — K5730 Diverticulosis of large intestine without perforation or abscess without bleeding: Secondary | ICD-10-CM | POA: Diagnosis not present

## 2022-03-22 DIAGNOSIS — K529 Noninfective gastroenteritis and colitis, unspecified: Secondary | ICD-10-CM | POA: Diagnosis not present

## 2022-03-22 DIAGNOSIS — K5521 Angiodysplasia of colon with hemorrhage: Secondary | ICD-10-CM | POA: Diagnosis not present

## 2022-03-22 DIAGNOSIS — K633 Ulcer of intestine: Secondary | ICD-10-CM | POA: Diagnosis not present

## 2022-03-26 ENCOUNTER — Telehealth: Payer: Self-pay | Admitting: *Deleted

## 2022-03-26 NOTE — Telephone Encounter (Signed)
Note ?  ?Transition Care Management Follow-up Telephone Call ?Date of discharge and from where: [facility] 03/23/22 ?How have you been since you were released from the hospital? very fatigued ?Any questions or concerns? none ?  ?Items Reviewed: ?Did the pt receive and understand the discharge instructions provided? yes ?Medications obtained and verified? yes ?Other?  ?Any new allergies since your discharge?  none ?Dietary orders reviewed? none given ?Do you have support at home? none, lives alone ?  ?Home Care and Equipment/Supplies: ?Were home health services ordered? none ?  ?Functional Questionnaire: (I = Independent and D = Dependent) ?ADLs: I ?  ?Bathing/Dressing- I ?  ?Meal Prep- I ?  ?Eating- I ?  ?Maintaining continence- I ?  ?Transferring/Ambulation- I ?  ?Managing Meds- I ?  ?Follow up appointments reviewed: ?  ?PCP Hospital f/u appt confirmed? seeing Dr.Knapp 03/29/22 '@11'$ :45am ?Are transportation arrangements needed? daughter will bring ?If their condition worsens, is the pt aware to call PCP or go to the Emergency Dept.? yes ?Was the patient provided with contact information for the PCP's office or ED? yes ?Was to pt encouraged to call back with questions or concerns? yes  ?  ? ?  ? ?

## 2022-03-28 NOTE — Progress Notes (Signed)
Chief Complaint  ?Patient presents with  ? Hospitalization Follow-up  ?  Hospital follow up. Patient states she is feeling better. She was taking Aleve earlier in the month although not on med list, daughter said she was "sneaking it in."  ? ?Patient presents for hospital f/u, accompanied by her daughter. She was hospitalized at [facility] 4/25-4/28/23 with lower GI bleed.  ? ?She had felt exhausted, then developed melena and "red jelly" in with her bowel movements. Hgb was 6.8 on admission (had been 12.6 in March). She was transfused.  Hgb remained stable and at discharge was 9.2.  Mg was low on admit (1.6), normal at 1.9 prior to d/c. Hyponatremic on admit (129), normal on d/c. ? ?EGD 03/20/22 showed normal stomach and duodenum.  There was plaque in the upper esophagus c/w candida. No bleeding noted. ?Colonoscopy 03/20/22 showed AVM in transverse colon with central erosion with active oozing (felt to be source of melena, hemoclips placed, achieving hemostasis).  Multiple ICV erosions that weren't bleeding (suspected NSAID-related). Pancolonic diverticula. ? ?Path reports revealed candida from esophageal biopsy. ?Ileocecal valve biopsies--focal ulceration and active inflammation, no malignancy. ?Transverse colon biopsy--focal active inflammation and superficial erosion, no malignancy. ? ?The inflammation/erosions were felt to be attributed to NSAID (Aleve) use. ?She was prescribed a 2 week course of fluconazole to treat Candida esophagitis (200mg  daily) ?Repeat colonoscopy was recommended if having recurrent bleeding or refractory anemia. ? ?She was supposed to speak with her oncologist prior to restarting her chemo (to restart 5/8 after checking with onc). She has oncology appointment tomorrow. ? ?She admits today that she had been taking 1 Aleve per night to "help her sleep better". ?Started when she had some pain at night, but slept better when taking it, so kept taking it at night (was not a PM version, was plain Aleve).   She denies any significant joint pains now.  No abdominal pain. ? ?Energy is improving, denies any dizziness. ?She is now having normal bowel movements. No abdominal pain. ?Denies nausea or vomiting, slightly decreased appetite. ? ?Losartan was stopped in the hospital. ?Not checking BP since home. ?Hasn't checked her sugars since home. ? ?They discussed that she had a "purple bracelet" in the hospital-- ?They had decided she would be DNR. ?We reviewed her current documentation--MOST form had +CPR, and we didn't have copy of living will or [facility] POA. ? ? ? ?PMH, PSH, SH reviewed ? ?Outpatient Encounter Medications as of 03/29/2022  ?Medication Sig Note  ? BIOTIN PO Take 1 tablet by mouth daily.   ? BYDUREON BCISE 2 MG/0.85ML AUIJ INJECT 1 PEN SUBCUTANEOUSLY ONCE A WEEK   ? Calcium Carbonate-Vitamin D 600-200 MG-UNIT TABS Take 1 tablet by mouth daily.   ? Cholecalciferol (VITAMIN D) 1000 UNITS capsule Take 1,000 Units by mouth daily.   ? Coenzyme Q10 (CO Q 10) 100 MG CAPS Take 1 capsule by mouth daily.   ? fish oil-omega-3 fatty acids 1000 MG capsule Take 3 g by mouth daily.   ? fluconazole (DIFLUCAN) 200 MG tablet Take 200 mg by mouth daily. 03/29/2022: Started 03/24/22  ? glucose blood test strip 1 each by Other route 2 (two) times daily. Test twice daily   ? magnesium gluconate (MAGONATE) 500 MG tablet Take 500 mg by mouth daily.   ? metFORMIN (GLUCOPHAGE) 1000 MG tablet TAKE 1 TABLET BY MOUTH  TWICE DAILY WITH MEALS   ? Multiple Vitamins-Minerals (EYE VITAMINS PO) Take 1 capsule by mouth daily.   ? Multiple Vitamins-Minerals (  MULTIVITAMIN WITH MINERALS) tablet Take 1 tablet by mouth daily.   ? pioglitazone (ACTOS) 15 MG tablet Take 1 tablet (15 mg total) by mouth daily.   ? simvastatin (ZOCOR) 20 MG tablet Take 1 tablet (20 mg total) by mouth daily.   ? SYNTHROID 50 MCG tablet TAKE 1 TABLET DAILY BEFORE BREAKFAST FOR HYPOTHYROIDISM   ? vitamin C (ASCORBIC ACID) 500 MG tablet Take 500 mg by mouth daily.   ? losartan  (COZAAR) 100 MG tablet TAKE 1 TABLET BY MOUTH DAILY (Patient not taking: Reported on 03/26/2022) 03/29/2022: Until she has visit tomorrow-stopped 03/19/22  ? [DISCONTINUED] magic mouthwash w/lidocaine SOLN Take 30 mLs by mouth. (Patient not taking: Reported on 03/26/2022) 02/28/2022: Using 4-5 times daily  ? ?No facility-administered encounter medications on file as of 03/29/2022.  ? ?No Known Allergies ? ?ROS: no fever, chills, URI symptoms, headaches, dizziness, chest pain, shortness of breath.  Energy is improving. No edema, bleeding, bruising, rashes. No n/v/d, abdominal pain or urinary complaints. ?See HPI ? ? ? ?PHYSICAL EXAM: ? ?BP 112/60   Pulse 96   Ht 4' 11.5" (1.511 m)   Wt 143 lb 9.6 oz (65.1 kg)   BMI 28.52 kg/m?  ? ?Wt Readings from Last 3 Encounters:  ?03/29/22 143 lb 9.6 oz (65.1 kg)  ?02/28/22 147 lb 3.2 oz (66.8 kg)  ?10/11/21 161 lb 9.6 oz (73.3 kg)  ? ?Pleasant female, in no distress ?HEENT: conjunctiva and sclera are clear, EOMI. Significant alopecia noted ?Skin appears pale. ?Neck: no lymphadenopathy, thyromegaly or mass ?Heart: regula rrate and rhythm ?Lungs: clear bilaterally ?Abdomen: soft, nontender, no mass ?Extremities: no edema.  (Lesions on RLE seem similar to prior visit) ?Back: no spinal or CVA tenderness ?Neuro: alert and oriented, normal strength, gait ?Psych: normal mood, affect, hygiene and grooming ? ? ?ASSESSMENT/PLAN: ? ?Lower GI bleed - Resolved (AVM treated during colonoscopy), now with normal stools. Due for recheck of CBC, prefers to wait to have labs with onc tomorrow ? ?AVM (arteriovenous malformation) of colon - cause for GI bleed per colonoscopy. Treated. Due for recheck of CBC to ensure stability (to be done at oncology visit tomorrow) ? ?Erosion of colon - multiple, felt to be related to use of NSAIDs. Surprisingly, normal stomach/EGD. Pt has stopped Aleve. To use Tylenol prn ? 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  ? ?Hyponatremia - noted on  admission, resolved at discharge. Rec recheck chem panel (also LFTs due to antifungal)--prefers to wait and get labs at oncologist tomorrow ? ?Controlled type 2 diabetes mellitus with complication, without long-term current use of insulin ([place]) - encouraged her to resume checking blood sugars ? ?Hypertension associated with diabetes ([place]) - currently off losartan.  BP still low. Will not restart yet.  Pt to monitor BP at home, and to restart if/when >130/80 (or else will restart lower dose at f/u) ? ?Advanced directives, counseling/discussion - DNR FFO, MOST form completed--DNR, limited care. PT given originals, copies to be scanned in ? ?Long discussion today--wants DNR. No ICU/intubation, but can be hospitalized. IV and tube feeds for trial period.  They will bring copies of her Living Will, healthcare POA. ? ?CBC, c-met rec--prefers to wait, as labs will be drawn at oncology visit tomorrow. ? ?All questions answered. ? ?I spent 50 minutes dedicated to the care of this patient, including pre-visit review of records, face to face time, post-visit ordering of testing and documentation. ? ?

## 2022-03-29 ENCOUNTER — Telehealth: Payer: Self-pay | Admitting: Family Medicine

## 2022-03-29 ENCOUNTER — Encounter: Payer: Self-pay | Admitting: Family Medicine

## 2022-03-29 ENCOUNTER — Ambulatory Visit (INDEPENDENT_AMBULATORY_CARE_PROVIDER_SITE_OTHER): Payer: Medicare Other | Admitting: Family Medicine

## 2022-03-29 VITALS — BP 112/60 | HR 96 | Ht 59.5 in | Wt 143.6 lb

## 2022-03-29 DIAGNOSIS — Z7189 Other specified counseling: Secondary | ICD-10-CM

## 2022-03-29 DIAGNOSIS — K922 Gastrointestinal hemorrhage, unspecified: Secondary | ICD-10-CM | POA: Diagnosis not present

## 2022-03-29 DIAGNOSIS — B3781 Candidal esophagitis: Secondary | ICD-10-CM | POA: Diagnosis not present

## 2022-03-29 DIAGNOSIS — E1159 Type 2 diabetes mellitus with other circulatory complications: Secondary | ICD-10-CM | POA: Diagnosis not present

## 2022-03-29 DIAGNOSIS — K5520 Angiodysplasia of colon without hemorrhage: Secondary | ICD-10-CM | POA: Diagnosis not present

## 2022-03-29 DIAGNOSIS — K633 Ulcer of intestine: Secondary | ICD-10-CM

## 2022-03-29 DIAGNOSIS — E871 Hypo-osmolality and hyponatremia: Secondary | ICD-10-CM

## 2022-03-29 DIAGNOSIS — E118 Type 2 diabetes mellitus with unspecified complications: Secondary | ICD-10-CM

## 2022-03-29 DIAGNOSIS — I152 Hypertension secondary to endocrine disorders: Secondary | ICD-10-CM | POA: Diagnosis not present

## 2022-03-29 NOTE — Telephone Encounter (Signed)
Pt's daughter came back in and dropped off pt's Health care power of attorney.  ?

## 2022-03-29 NOTE — Patient Instructions (Addendum)
You are due for repeat of complete blood count (to follow up on the bleed and ensure that your hemoglobin remains stable), and also a chem panel (to check your electrolytes, and liver tests (being on the antifungal)). ?These can be done by your oncologist tomorrow. ? ?Your losartan was stopped in the hospital due to low blood pressures . It is still on the low side today, so I'm not recommending restarting it yet. ?Please monitor your blood pressure at home.  Contact us if/when it becomes >130/80 and we can give guidance as to what dose to restart at. ? ?Please resume checking your blood sugars periodically as well. ? ?You may take some tylenol if needed for any pain. ?Do not take any aleve/naproxen/ibuprofen/advil/motrin/Goody or BC powder, or any aspirin. ? ?Please bring [place] copies of your Living Will and [place] once completed and notarized so that it can be scanned into your medical chart. ? ?

## 2022-03-30 DIAGNOSIS — R946 Abnormal results of thyroid function studies: Secondary | ICD-10-CM | POA: Diagnosis not present

## 2022-03-30 DIAGNOSIS — C4371 Malignant melanoma of right lower limb, including hip: Secondary | ICD-10-CM | POA: Diagnosis not present

## 2022-03-30 DIAGNOSIS — C439 Malignant melanoma of skin, unspecified: Secondary | ICD-10-CM | POA: Diagnosis not present

## 2022-03-30 DIAGNOSIS — B3781 Candidal esophagitis: Secondary | ICD-10-CM | POA: Diagnosis not present

## 2022-03-30 DIAGNOSIS — R7989 Other specified abnormal findings of blood chemistry: Secondary | ICD-10-CM | POA: Diagnosis not present

## 2022-03-30 DIAGNOSIS — Z79890 Hormone replacement therapy (postmenopausal): Secondary | ICD-10-CM | POA: Diagnosis not present

## 2022-03-30 DIAGNOSIS — C779 Secondary and unspecified malignant neoplasm of lymph node, unspecified: Secondary | ICD-10-CM | POA: Diagnosis not present

## 2022-03-30 DIAGNOSIS — R918 Other nonspecific abnormal finding of lung field: Secondary | ICD-10-CM | POA: Diagnosis not present

## 2022-03-30 DIAGNOSIS — Z8719 Personal history of other diseases of the digestive system: Secondary | ICD-10-CM | POA: Diagnosis not present

## 2022-03-30 DIAGNOSIS — D500 Iron deficiency anemia secondary to blood loss (chronic): Secondary | ICD-10-CM | POA: Diagnosis not present

## 2022-04-03 ENCOUNTER — Encounter: Payer: Self-pay | Admitting: Family Medicine

## 2022-04-05 ENCOUNTER — Ambulatory Visit (INDEPENDENT_AMBULATORY_CARE_PROVIDER_SITE_OTHER): Payer: Medicare Other | Admitting: Family Medicine

## 2022-04-05 ENCOUNTER — Encounter: Payer: Self-pay | Admitting: Family Medicine

## 2022-04-05 VITALS — BP 108/60 | HR 80 | Ht 59.5 in | Wt 142.4 lb

## 2022-04-05 DIAGNOSIS — Z79899 Other long term (current) drug therapy: Secondary | ICD-10-CM

## 2022-04-05 DIAGNOSIS — D75839 Thrombocytosis, unspecified: Secondary | ICD-10-CM | POA: Diagnosis not present

## 2022-04-05 DIAGNOSIS — R748 Abnormal levels of other serum enzymes: Secondary | ICD-10-CM | POA: Diagnosis not present

## 2022-04-05 DIAGNOSIS — C439 Malignant melanoma of skin, unspecified: Secondary | ICD-10-CM

## 2022-04-05 DIAGNOSIS — C779 Secondary and unspecified malignant neoplasm of lymph node, unspecified: Secondary | ICD-10-CM

## 2022-04-05 DIAGNOSIS — R531 Weakness: Secondary | ICD-10-CM

## 2022-04-05 DIAGNOSIS — M791 Myalgia, unspecified site: Secondary | ICD-10-CM

## 2022-04-05 LAB — CBC WITH DIFFERENTIAL/PLATELET
Basophils Absolute: 0 10*3/uL (ref 0.0–0.2)
Basos: 0 %
EOS (ABSOLUTE): 0.4 10*3/uL (ref 0.0–0.4)
Eos: 5 %
Hematocrit: 29.4 % — ABNORMAL LOW (ref 34.0–46.6)
Hemoglobin: 9.8 g/dL — ABNORMAL LOW (ref 11.1–15.9)
Immature Grans (Abs): 0 10*3/uL (ref 0.0–0.1)
Immature Granulocytes: 1 %
Lymphocytes Absolute: 0.9 10*3/uL (ref 0.7–3.1)
Lymphs: 12 %
MCH: 27 pg (ref 26.6–33.0)
MCHC: 33.3 g/dL (ref 31.5–35.7)
MCV: 81 fL (ref 79–97)
Monocytes Absolute: 0.7 10*3/uL (ref 0.1–0.9)
Monocytes: 9 %
Neutrophils Absolute: 5.6 10*3/uL (ref 1.4–7.0)
Neutrophils: 73 %
Platelets: 417 10*3/uL (ref 150–450)
RBC: 3.63 x10E6/uL — ABNORMAL LOW (ref 3.77–5.28)
RDW: 15.6 % — ABNORMAL HIGH (ref 11.7–15.4)
WBC: 7.7 10*3/uL (ref 3.4–10.8)

## 2022-04-05 LAB — COMPREHENSIVE METABOLIC PANEL
ALT: 33 IU/L — ABNORMAL HIGH (ref 0–32)
AST: 57 IU/L — ABNORMAL HIGH (ref 0–40)
Albumin/Globulin Ratio: 1.6 (ref 1.2–2.2)
Albumin: 3.9 g/dL (ref 3.6–4.6)
Alkaline Phosphatase: 51 IU/L (ref 44–121)
BUN/Creatinine Ratio: 19 (ref 12–28)
BUN: 19 mg/dL (ref 8–27)
Bilirubin Total: <0.2 mg/dL (ref 0.0–1.2)
CO2: 22 mmol/L (ref 20–29)
Calcium: 9.7 mg/dL (ref 8.7–10.3)
Chloride: 89 mmol/L — ABNORMAL LOW (ref 96–106)
Creatinine, Ser: 1.02 mg/dL — ABNORMAL HIGH (ref 0.57–1.00)
Globulin, Total: 2.4 g/dL (ref 1.5–4.5)
Glucose: 160 mg/dL — ABNORMAL HIGH (ref 70–99)
Potassium: 4.6 mmol/L (ref 3.5–5.2)
Sodium: 131 mmol/L — ABNORMAL LOW (ref 134–144)
Total Protein: 6.3 g/dL (ref 6.0–8.5)
eGFR: 55 mL/min/{1.73_m2} — ABNORMAL LOW (ref >59)

## 2022-04-05 LAB — POCT URINALYSIS DIP (PROADVANTAGE DEVICE)
Bilirubin, UA: NEGATIVE
Blood, UA: NEGATIVE
Glucose, UA: NEGATIVE mg/dL
Ketones, POC UA: NEGATIVE mg/dL
Leukocytes, UA: NEGATIVE
Nitrite, UA: POSITIVE — AB
Protein Ur, POC: NEGATIVE mg/dL
Specific Gravity, Urine: 1.02
Urobilinogen, Ur: NEGATIVE
pH, UA: 6.5 (ref 5.0–8.0)

## 2022-04-05 LAB — CK: Total CK: 947 U/L (ref 26–161)

## 2022-04-05 LAB — C-REACTIVE PROTEIN: CRP: 104 mg/L — ABNORMAL HIGH (ref 0–10)

## 2022-04-05 NOTE — Progress Notes (Signed)
Chief Complaint  ?Patient presents with  ? Fatigue  ?  Is not feeling well, very fatigued. Vomited Tues middle of the night, one time. Slid off the bed yesterday afternoon, spent 3hrs on the floor waiting for [name] to get there as she had no energy to get up. This morning had small bright red streaks of blood after normal BM.   ? ?Patient presents with complaint of fatigue, muscle aches and weakness.  She is accompanied by her daughter, [name].   ?Chart reviewed--had labs with onc on 5/5, Hgb 9.5 (stable). Plt were elevated at 713. ? ?Monday--didn't feel great, felt weak, but able to move around ?Tuesday--didn't really get out of bed much. ?Yesterday afternoon, she slid off the side of her bed (issue with deflating of mattress as cause, slopes/soft)--she had a controlled fall, where she was seated on the ground. She wasn't able to get back up to bed, sat for 3 hours until her daughter came. ?She has had decreased appetite since chemo, getting worse, but still forcing herself to eat. Eating small amounts. ? ?Today she reports have a hard time lifting her legs up, getting up off toilet (it is low). ?She has pain in her arms when she tries to lift above her head.  She has pain in legs--hurts to try and move them. Wasn't able to lift her legs to get into her car. ? ?She vomited once early Tuesday morning--looked like food she had eaten, dark, denies like coffee grounds. No further emesis.  She denies abdominal pain. ?Moving BM's daily, no melena,  ?Small amount of bright red blood at the end noted today (?hemorrhoid per daughter, who observed stool). ? ?Sugars have been good--were 104, 109 in the afternoon. ? ?She denies any significant urinary changes--notes some trouble emptying, takes a long time. No dysuria, no incontinence, no odor. ? ? ?PMH, PSH, SH reviewed ? ?Outpatient Encounter Medications as of 04/05/2022  ?Medication Sig Note  ? binimetinib (MEKTOVI) 15 MG tablet Take 30 mg by mouth 2 (two) times daily.  04/05/2022: Last dose yesterday  ? BIOTIN PO Take 1 tablet by mouth daily.   ? BYDUREON BCISE 2 MG/0.85ML AUIJ INJECT 1 PEN SUBCUTANEOUSLY ONCE A WEEK   ? Calcium Carbonate-Vitamin D 600-200 MG-UNIT TABS Take 1 tablet by mouth daily.   ? Cholecalciferol (VITAMIN D) 1000 UNITS capsule Take 1,000 Units by mouth daily.   ? Coenzyme Q10 (CO Q 10) 100 MG CAPS Take 1 capsule by mouth daily.   ? encorafenib (BRAFTOVI) 75 MG capsule Take 300 mg by mouth daily. 04/05/2022: Last dose was 2 days ago  ? fish oil-omega-3 fatty acids 1000 MG capsule Take 3 g by mouth daily.   ? fluconazole (DIFLUCAN) 200 MG tablet Take 200 mg by mouth daily. 03/29/2022: Started 03/24/22  ? glucose blood test strip 1 each by Other route 2 (two) times daily. Test twice daily   ? magnesium gluconate (MAGONATE) 500 MG tablet Take 500 mg by mouth daily.   ? metFORMIN (GLUCOPHAGE) 1000 MG tablet TAKE 1 TABLET BY MOUTH  TWICE DAILY WITH MEALS   ? Multiple Vitamins-Minerals (EYE VITAMINS PO) Take 1 capsule by mouth daily.   ? Multiple Vitamins-Minerals (MULTIVITAMIN WITH MINERALS) tablet Take 1 tablet by mouth daily.   ? NON FORMULARY Take 1 capsule by mouth in the morning and at bedtime. 04/05/2022: Hemp Oil and Turmeric curcumin, black pepper w/bioperine 6000mg   ? pioglitazone (ACTOS) 15 MG tablet Take 1 tablet (15 mg total) by mouth  daily.   ? simvastatin (ZOCOR) 20 MG tablet Take 1 tablet (20 mg total) by mouth daily.   ? SYNTHROID 50 MCG tablet TAKE 1 TABLET DAILY BEFORE BREAKFAST FOR HYPOTHYROIDISM   ? vitamin C (ASCORBIC ACID) 500 MG tablet Take 500 mg by mouth daily.   ? losartan (COZAAR) 100 MG tablet TAKE 1 TABLET BY MOUTH DAILY (Patient not taking: Reported on 03/26/2022)   ? ?No facility-administered encounter medications on file as of 04/05/2022.  ? ?Losartan being held since GI bleed. ? ?No Known Allergies ? ?ROS: no fever, chills, URI symptoms, abdominal pain, diarrhea, hematochezia, melena (just small amt of BRB at end of stool today).  No edema,  chest pain, shortness of breath.  +decreased appetite, weakness and muscle pains per HPI. ?See HPI ? ? ?PHYSICAL EXAM: ? ?BP 108/60   Pulse 80   Ht 4' 11.5" (1.511 m)   Wt 142 lb 6.4 oz (64.6 kg)   BMI 28.28 kg/m?  ? ?Wt Readings from Last 3 Encounters:  ?04/05/22 142 lb 6.4 oz (64.6 kg)  ?03/29/22 143 lb 9.6 oz (65.1 kg)  ?02/28/22 147 lb 3.2 oz (66.8 kg)  ? ?Elderly female, in no distress.  Doesn't appear any paler/different than at recent visit. ?Hard of hearing ?HEENT: conjunctiva and sclera are clear, EOMI ?Neck: no lymphadenopathy or mass ?Heart: regular rate and rhythm ?Lungs: clear bilaterally ?Back: no CVA tenderness ?Abdomen: soft, nontender ?Extremities: no edema.   ?Neuro: alert and oriented, cranial nerves grossly intact.  Ambulates with walker. ?Hip flexion--painful bilaterally, with some giving way (due to pain, per pt) ?Normal strength elsewhere in upper and lower extremities.  Some discomfort with deltoid testing, but normal strength. ?Area of her pain is in bilateral thighs, nontender to palpation ?Skin: normal turgor, no rashes ?Psych: normal mood, affect, hygiene and grooming. ? ? ?ASSESSMENT/PLAN: ? ?Generalized weakness - recheck labs. Doesn't appear dehydrated - Plan: CBC with Differential/Platelet, Comprehensive metabolic panel, C-reactive protein, CK, POCT Urinalysis DIP (Proadvantage Device), AMB Referral to [facility], CBC with Differential/Platelet ? ?Myalgia - in proximal muscles (thighs, upper arms).  elevated CPK is a risk with her chemo, check levels and contact oncologist if elevated. Has been holding meds - Plan: CBC with Differential/Platelet, Comprehensive metabolic panel, C-reactive protein, CK, POCT Urinalysis DIP (Proadvantage Device), CK ? ?Thrombocytosis - noted on 5/5 labs. Recheck today - Plan: CBC with Differential/Platelet, POCT Urinalysis DIP (Proadvantage Device) ? ?Patient on antineoplastic chemotherapy regimen - Plan: CBC with  Differential/Platelet, Comprehensive metabolic panel, C-reactive protein, CK, POCT Urinalysis DIP (Proadvantage Device), CK, CBC with Differential/Platelet ? 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 ?Elevated CK - noted on stat labs today--holding meds, and will recheck Monday, send results to oncologist. - Plan: CK ? ?Stat CBC, c-met, c-reactive protein, CPK ? ?[phone] Dr. Baltazar Najjar ([facility]) ? ?CCM referral--help at home (caregivers), call button, etc. ?Daughter 2 hours away, pt weak from chemo ? ?[phone] [name] (daughter) ? ?UPDATE: ?CK 947, CRP 104.  AST/ALT mildly elevated at 57/33 ?Chem ok--glu 160, Cr 1.02, Na 131, Cl 89, rest normal. ?CBC stable, WBC 7.7, hgb 9.8, plt 417 ? ?Stop both chemo agents and recheck labs here Monday (Hgb and CK per onc); we will forward labs to oncologist.  He will want to see her sooner than scheduled if not improving/feeling better next week. ? ? ?I spent 45 minutes dedicated to the care of this patient, including pre-visit review of  records, face to face time, post-visit ordering of testing, phone call to specialist, and documentation. ? ?

## 2022-04-05 NOTE — Progress Notes (Signed)
Called pt's home, spoke with dtr [name] (pt napping); advised her that I spoke with oncologist, who said to stop the chemo until CK normalizes. To repeat on Monday.  They will come Monday afternoon for lab visit, will forward results to [facility] oncologist.

## 2022-04-05 NOTE — Patient Instructions (Signed)
Try Glucerna shakes (vs or in addition to Atkins, depending on how much you are able to eat).  ?

## 2022-04-06 ENCOUNTER — Encounter: Payer: Self-pay | Admitting: Family Medicine

## 2022-04-07 ENCOUNTER — Other Ambulatory Visit: Payer: Self-pay | Admitting: Family Medicine

## 2022-04-07 DIAGNOSIS — E1149 Type 2 diabetes mellitus with other diabetic neurological complication: Secondary | ICD-10-CM

## 2022-04-09 ENCOUNTER — Other Ambulatory Visit: Payer: Medicare Other

## 2022-04-09 ENCOUNTER — Telehealth: Payer: Self-pay | Admitting: *Deleted

## 2022-04-09 ENCOUNTER — Other Ambulatory Visit: Payer: Self-pay | Admitting: *Deleted

## 2022-04-09 DIAGNOSIS — Z79899 Other long term (current) drug therapy: Secondary | ICD-10-CM | POA: Diagnosis not present

## 2022-04-09 DIAGNOSIS — M791 Myalgia, unspecified site: Secondary | ICD-10-CM | POA: Diagnosis not present

## 2022-04-09 DIAGNOSIS — R748 Abnormal levels of other serum enzymes: Secondary | ICD-10-CM

## 2022-04-09 DIAGNOSIS — E1149 Type 2 diabetes mellitus with other diabetic neurological complication: Secondary | ICD-10-CM

## 2022-04-09 DIAGNOSIS — R531 Weakness: Secondary | ICD-10-CM

## 2022-04-09 DIAGNOSIS — Z7969 Long term (current) use of other immunomodulators and immunosuppressants: Secondary | ICD-10-CM

## 2022-04-09 MED ORDER — BYDUREON BCISE 2 MG/0.85ML ~~LOC~~ AUIJ: AUTO-INJECTOR | SUBCUTANEOUS | 2 refills | Status: DC

## 2022-04-09 NOTE — Chronic Care Management (AMB) (Signed)
?  Chronic Care Management  ? ?Note ? ?04/09/2022 ?Name: Tonya Rogers MRN: [id number] DOB: 1936/06/02 ? ?Tonya Rogers is a 86 y.o. year old female who is a primary care patient of Rita Ohara, MD. I reached out to Tonya Rogers by phone today in response to a referral sent by Tonya Rogers's PCP. ? ?Tonya Rogers was given information about Chronic Care Management services today including:  ?CCM service includes personalized support from designated clinical staff supervised by her physician, including individualized plan of care and coordination with other care providers ?24/7 contact phone numbers for assistance for urgent and routine care needs. ?Service will only be billed when office clinical staff spend 20 minutes or more in a month to coordinate care. ?Only one practitioner may furnish and bill the service in a calendar month. ?The patient may stop CCM services at any time (effective at the end of the month) by phone call to the office staff. ?The patient is responsible for co-pay (up to 20% after annual deductible is met) if co-pay is required by the individual health plan.  ? ?Patient agreed to services and verbal consent obtained.  ? ?Follow up plan: ?Telephone appointment with care management team member scheduled for:BSW on 5/18 and RNCM on 5/25 ? ?Laverda Sorenson  ?Care Guide, Embedded Care Coordination ?[place]  Care Management  ?Direct Dial: [phone] ? ?

## 2022-04-10 LAB — CBC WITH DIFFERENTIAL/PLATELET
Basophils Absolute: 0.1 10*3/uL (ref 0.0–0.2)
Basos: 2 %
EOS (ABSOLUTE): 0.1 10*3/uL (ref 0.0–0.4)
Eos: 2 %
Hematocrit: 30.2 % — ABNORMAL LOW (ref 34.0–46.6)
Hemoglobin: 9.9 g/dL — ABNORMAL LOW (ref 11.1–15.9)
Immature Grans (Abs): 0.1 10*3/uL (ref 0.0–0.1)
Immature Granulocytes: 1 %
Lymphocytes Absolute: 1.7 10*3/uL (ref 0.7–3.1)
Lymphs: 31 %
MCH: 26.6 pg (ref 26.6–33.0)
MCHC: 32.8 g/dL (ref 31.5–35.7)
MCV: 81 fL (ref 79–97)
Monocytes Absolute: 0.7 10*3/uL (ref 0.1–0.9)
Monocytes: 13 %
Neutrophils Absolute: 2.8 10*3/uL (ref 1.4–7.0)
Neutrophils: 51 %
Platelets: 466 10*3/uL — ABNORMAL HIGH (ref 150–450)
RBC: 3.72 x10E6/uL — ABNORMAL LOW (ref 3.77–5.28)
RDW: 15.7 % — ABNORMAL HIGH (ref 11.7–15.4)
WBC: 5.3 10*3/uL (ref 3.4–10.8)

## 2022-04-10 LAB — CK: Total CK: 175 U/L — ABNORMAL HIGH (ref 26–161)

## 2022-04-12 ENCOUNTER — Ambulatory Visit (INDEPENDENT_AMBULATORY_CARE_PROVIDER_SITE_OTHER): Payer: Medicare Other

## 2022-04-12 ENCOUNTER — Telehealth: Payer: Medicare Other

## 2022-04-12 ENCOUNTER — Ambulatory Visit: Payer: Self-pay

## 2022-04-12 DIAGNOSIS — C439 Malignant melanoma of skin, unspecified: Secondary | ICD-10-CM

## 2022-04-12 DIAGNOSIS — R531 Weakness: Secondary | ICD-10-CM

## 2022-04-12 DIAGNOSIS — E118 Type 2 diabetes mellitus with unspecified complications: Secondary | ICD-10-CM

## 2022-04-12 NOTE — Chronic Care Management (AMB) (Signed)
Chronic Care Management    Social Work Note  04/12/2022 Name: Tonya Rogers MRN: [id number] DOB: 1936-09-04  Tonya Rogers is a 86 y.o. year old female who is a primary care patient of Tonya Ohara, MD. The CCM team was consulted to assist the patient with chronic disease management and/or care coordination needs related to:  Generalized Weakness, In-Transit Metastasis from Malignant Melanoma of Skin, DM II .   Engaged with patient by telephone for initial visit in response to provider referral for social work chronic care management and care coordination services.   Consent to Services:  The patient was given the following information about Chronic Care Management services today, agreed to services, and gave verbal consent: 1. CCM service includes personalized support from designated clinical staff supervised by the primary care provider, including individualized plan of care and coordination with other care providers 2. 24/7 contact phone numbers for assistance for urgent and routine care needs. 3. Service will only be billed when office clinical staff spend 20 minutes or more in a month to coordinate care. 4. Only one practitioner may furnish and bill the service in a calendar month. 5.The patient may stop CCM services at any time (effective at the end of the month) by phone call to the office staff. 6. The patient will be responsible for cost sharing (co-pay) of up to 20% of the service fee (after annual deductible is met). Patient agreed to services and consent obtained.  Patient agreed to services and consent obtained.   Assessment: Review of patient past medical history, allergies, medications, and health status, including review of relevant consultants reports was performed today as part of a comprehensive evaluation and provision of chronic care management and care coordination services.     SDOH (Social Determinants of Health) assessments and interventions performed:  SDOH  Interventions    Flowsheet Row Most Recent Value  SDOH Interventions   Food Insecurity Interventions Intervention Not Indicated  Housing Interventions Intervention Not Indicated  Transportation Interventions Intervention Not Indicated        Advanced Directives Status: See [employer] application for related entries.  CCM Care Plan  No Known Allergies  Outpatient Encounter Medications as of 04/12/2022  Medication Sig Note   binimetinib (MEKTOVI) 15 MG tablet Take 30 mg by mouth 2 (two) times daily. 04/05/2022: Last dose yesterday   BIOTIN PO Take 1 tablet by mouth daily.    Calcium Carbonate-Vitamin D 600-200 MG-UNIT TABS Take 1 tablet by mouth daily.    Cholecalciferol (VITAMIN D) 1000 UNITS capsule Take 1,000 Units by mouth daily.    Coenzyme Q10 (CO Q 10) 100 MG CAPS Take 1 capsule by mouth daily.    encorafenib (BRAFTOVI) 75 MG capsule Take 300 mg by mouth daily. 04/05/2022: Last dose was 2 days ago   Exenatide ER (BYDUREON BCISE) 2 MG/0.85ML AUIJ INJECT 1 PEN SUBCUTANEOUSLY ONCE A WEEK    fish oil-omega-3 fatty acids 1000 MG capsule Take 3 g by mouth daily.    fluconazole (DIFLUCAN) 200 MG tablet Take 200 mg by mouth daily. 03/29/2022: Started 03/24/22   glucose blood test strip 1 each by Other route 2 (two) times daily. Test twice daily    losartan (COZAAR) 100 MG tablet TAKE 1 TABLET BY MOUTH DAILY (Patient not taking: Reported on 03/26/2022)    magnesium gluconate (MAGONATE) 500 MG tablet Take 500 mg by mouth daily.    metFORMIN (GLUCOPHAGE) 1000 MG tablet TAKE 1 TABLET BY MOUTH  TWICE DAILY WITH MEALS  Multiple Vitamins-Minerals (EYE VITAMINS PO) Take 1 capsule by mouth daily.    Multiple Vitamins-Minerals (MULTIVITAMIN WITH MINERALS) tablet Take 1 tablet by mouth daily.    NON FORMULARY Take 1 capsule by mouth in the morning and at bedtime. 04/05/2022: Hemp Oil and Turmeric curcumin, black pepper w/bioperine 6000mg    pioglitazone (ACTOS) 15 MG tablet Take 1 tablet (15 mg total) by  mouth daily.    simvastatin (ZOCOR) 20 MG tablet Take 1 tablet (20 mg total) by mouth daily.    SYNTHROID 50 MCG tablet TAKE 1 TABLET DAILY BEFORE BREAKFAST FOR HYPOTHYROIDISM    vitamin C (ASCORBIC ACID) 500 MG tablet Take 500 mg by mouth daily.    No facility-administered encounter medications on file as of 04/12/2022.    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[place]) 10/12/2011   Type II or unspecified type diabetes mellitus without mention of complication, not stated as uncontrolled 07/12/2011   Pure hypercholesterolemia 07/12/2011   Essential hypertension, benign 07/12/2011    Conditions to be addressed/monitored:  Generalized Weakness, In-Transit Metastasis from Malignant Melanoma of Skin, DM II ; Limited access to caregiver  Care Plan : Social Work Plan of Care  Updates made by Tonya Rogers since 04/12/2022 12:00 AM     Problem: Quality of Life (General Plan of Care)      Goal: Quality of Life Maintained   Start Date: 04/12/2022  Priority: High  Note:   Current Barriers:  Chronic disease management support and education needs related to  Generalized Weakness, In-transit Metastasis from Malignant Melanoma of Skin, DM II  Limited access to caregiver  Social Worker Clinical Goal(s):  patient will work with SW to identify and address any acute and/or chronic care coordination needs related to the self health management of  Generalized  Weakness and In-Transit Metastasis from Malignant Melanoma of Skin   patient will work with SW to address concerns related to caregiver access  SW Interventions:  Inter-disciplinary care team collaboration (see longitudinal plan of care) Collaboration with Tonya Ohara, MD regarding development and update of comprehensive plan of care as evidenced by provider attestation and co-signature Telephonic visit completed with the patient and her daughter [name] Discussed the patient at times feels very weak following oncology treatments and may benefit from a caregiver considering [name] lives in [place] and it takes her approximately 2 hours to get to the patients home when she is needed SDoH screening completed - patient has no acute challenges. Discussed [name] transports patient to all oncology appointments at [facility]. Patient is independent with ADL's and iADL's when she is feeling well Provided education on Mom's Meals as an option to have on hand when the patient does not feel up to preparing her own meals. Education sent via e-mail for [name] and the patient to review Verbal education provided on caregiver options including [facility] [employer] "caregiver on call" program - family is interested in learning more Discussed plan for SW to place referral and follow up with the patient and [name] over the  next two weeks. The patient will also be engaged by RN Case Manager  to address care management needs Collaboration with Arnell Asal with [facility] who advises the caregiver on call program is no longer being offered but the patient may benefit from the home care program Voice message left with [place] Director with [employer] [employer] to gain more information  Call placed to [facility] to determine they could offer services to the patient but would need approximately 24-48 hours notice when a caregiver is needed SW collaboration with RN Care Manager  to discuss patients enrollment into care management program and discuss interventions and plan SW will follow up with [name] after speaking with Mrs. Mooney with [facility]  Patient Goals/Self-Care Activities patient will:   -  Work with SW to identify resources options -Engage with Consulting civil engineer to develop an individualized plan of care  Follow Up Plan: The care management team will reach out to the patient again over the next 14 days.        Follow Up Plan: SW will follow up with patient by phone over the next week.      Tonya Rogers, BSW, CDP Social Worker, Certified Dementia Practitioner [facility] [phone]

## 2022-04-12 NOTE — Patient Instructions (Signed)
Social Worker Visit Information  Goals we discussed today:  Patient Goals/Self-Care Activities patient will:   - Work with SW to identify resources options -Engage with Consulting civil engineer to develop an individualized plan of care  Materials provided: Verbal education about resources provided by phone  Ms. Crilly was given information about Chronic Care Management services today including:  CCM service includes personalized support from designated clinical staff supervised by her physician, including individualized plan of care and coordination with other care providers 24/7 contact phone numbers for assistance for urgent and routine care needs. Service will only be billed when office clinical staff spend 20 minutes or more in a month to coordinate care. Only one practitioner may furnish and bill the service in a calendar month. The patient may stop CCM services at any time (effective at the end of the month) by phone call to the office staff. The patient will be responsible for cost sharing (co-pay) of up to 20% of the service fee (after annual deductible is met).  Patient agreed to services and verbal consent obtained.   Patient verbalizes understanding of instructions and care plan provided today and agrees to view in [place]. Active MyChart status and patient understanding of how to access instructions and care plan via MyChart confirmed with patient.     Follow up plan: SW will follow up with patient by phone over the next week after speaking with Mrs. Mooney with [employer] [employer]   Daneen Schick, BSW, CDP Social Worker, Certified Dementia Practitioner [facility] [place] [facility] [phone]

## 2022-04-12 NOTE — Chronic Care Management (AMB) (Signed)
Care Management    RN Visit Note  04/12/2022 Name: Tonya Rogers MRN: [id number] DOB: 10-26-1936  Subjective: Tonya Rogers is a 86 y.o. year old female who is a primary care patient of Rita Ohara, MD. The care management team was consulted for assistance with disease management and care coordination needs.    Collaboration with Daneen Schick BSW  for  establishment of plan of care for chronic disease states  in response to provider referral for case management and/or care coordination services.   Consent to Services:   Ms. Lamaster was given information about Care Management services today including:  Care Management services includes personalized support from designated clinical staff supervised by her physician, including individualized plan of care and coordination with other care providers 24/7 contact phone numbers for assistance for urgent and routine care needs. The patient may stop case management services at any time by phone call to the office staff.  Patient agreed to services and consent obtained.   Assessment: Review of patient past medical history, allergies, medications, health status, including review of consultants reports, laboratory and other test data, was performed as part of comprehensive evaluation and provision of chronic care management services.   SDOH (Social Determinants of Health) assessments and interventions performed:    Care Plan  No Known Allergies  Outpatient Encounter Medications as of 04/12/2022  Medication Sig Note   binimetinib (MEKTOVI) 15 MG tablet Take 30 mg by mouth 2 (two) times daily. 04/05/2022: Last dose yesterday   BIOTIN PO Take 1 tablet by mouth daily.    Calcium Carbonate-Vitamin D 600-200 MG-UNIT TABS Take 1 tablet by mouth daily.    Cholecalciferol (VITAMIN D) 1000 UNITS capsule Take 1,000 Units by mouth daily.    Coenzyme Q10 (CO Q 10) 100 MG CAPS Take 1 capsule by mouth daily.    encorafenib (BRAFTOVI) 75 MG capsule  Take 300 mg by mouth daily. 04/05/2022: Last dose was 2 days ago   Exenatide ER (BYDUREON BCISE) 2 MG/0.85ML AUIJ INJECT 1 PEN SUBCUTANEOUSLY ONCE A WEEK    fish oil-omega-3 fatty acids 1000 MG capsule Take 3 g by mouth daily.    fluconazole (DIFLUCAN) 200 MG tablet Take 200 mg by mouth daily. 03/29/2022: Started 03/24/22   glucose blood test strip 1 each by Other route 2 (two) times daily. Test twice daily    losartan (COZAAR) 100 MG tablet TAKE 1 TABLET BY MOUTH DAILY (Patient not taking: Reported on 03/26/2022)    magnesium gluconate (MAGONATE) 500 MG tablet Take 500 mg by mouth daily.    metFORMIN (GLUCOPHAGE) 1000 MG tablet TAKE 1 TABLET BY MOUTH  TWICE DAILY WITH MEALS    Multiple Vitamins-Minerals (EYE VITAMINS PO) Take 1 capsule by mouth daily.    Multiple Vitamins-Minerals (MULTIVITAMIN WITH MINERALS) tablet Take 1 tablet by mouth daily.    NON FORMULARY Take 1 capsule by mouth in the morning and at bedtime. 04/05/2022: Hemp Oil and Turmeric curcumin, black pepper w/bioperine '6000mg'$    pioglitazone (ACTOS) 15 MG tablet Take 1 tablet (15 mg total) by mouth daily.    simvastatin (ZOCOR) 20 MG tablet Take 1 tablet (20 mg total) by mouth daily.    SYNTHROID 50 MCG tablet TAKE 1 TABLET DAILY BEFORE BREAKFAST FOR HYPOTHYROIDISM    vitamin C (ASCORBIC ACID) 500 MG tablet Take 500 mg by mouth daily.    No facility-administered encounter medications on file as of 04/12/2022.    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[place]) 10/12/2011   Type II or unspecified type diabetes mellitus without mention of complication, not stated as uncontrolled 07/12/2011   Pure hypercholesterolemia 07/12/2011   Essential hypertension, benign 07/12/2011    Conditions to be addressed/monitored:  Generalized weakness, 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  Care Plan : [place] of Care  Updates made by Lynne Logan, RN since 04/12/2022 12:00 AM     Problem: No plan of care established for management of chronic disease states (Generalized weakness, 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)   Priority: High     Long-Range Goal: Establishment of plan of care for management of chronic disease states (Generalized weakness, 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)   Start Date: 04/12/2022  Expected End Date: 04/12/2023  This Visit's Progress: On track  Priority: High  Note:   Current Barriers:  Knowledge Deficits related to plan of care for management of Generalized weakness, 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  Care Coordination needs related to Limited access to caregiver and Lacks knowledge of community resource: home delivered meals  Chronic Disease Management support and education needs related to Generalized weakness, 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   RNCM Clinical Goal(s):  Patient will continue to work with RN Care Manager to address care management and care coordination needs related to  Generalized weakness, 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   Interventions: 1:1 collaboration with primary care provider regarding development and update of comprehensive plan of care as evidenced by provider attestation and co-signature Inter-disciplinary care team collaboration (see longitudinal plan of care) Evaluation of current treatment plan related to  self management and patient's adherence to plan as established by provider  Interdisciplinary Collaboration Interventions:  (Status: New goal.) Long Term Goal   Collaborated with BSW to initiate plan of care to address needs related to Limited access to caregiver and Lacks knowledge of community resource: home delivered meals  in patient with  Generalized weakness, 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 Collaboration with Rita Ohara, MD regarding development and update of comprehensive plan of care as evidenced by provider attestation and co-signature Inter-disciplinary care team collaboration   Patient Goals/Self-Care Activities: Take all medications as prescribed Attend all scheduled provider appointments Call pharmacy for medication refills 3-7 days in advance of running out of medications Call provider office for new concerns or questions  Work with the social worker to address care coordination needs and will continue to work with the clinical team to address health care and disease management related needs  Follow Up Plan:  Initial RN Care manager telephone appointment  is scheduled for:  04/19/22     Barb Merino, RN, BSN, CCM Care Management Coordinator [facility] [facility]  Direct Phone: [phone]

## 2022-04-18 ENCOUNTER — Ambulatory Visit: Payer: Medicare Other

## 2022-04-18 DIAGNOSIS — R531 Weakness: Secondary | ICD-10-CM

## 2022-04-18 DIAGNOSIS — C439 Malignant melanoma of skin, unspecified: Secondary | ICD-10-CM

## 2022-04-18 DIAGNOSIS — E118 Type 2 diabetes mellitus with unspecified complications: Secondary | ICD-10-CM

## 2022-04-18 NOTE — Patient Instructions (Signed)
Social Worker Visit Information  Goals we discussed today:  Patient Goals/Self-Care Activities patient will: with the help of her daughter -Contact caregiver agencies to determine which may be a good fit for future needs -Engage with Consulting civil engineer to develop an individualized plan of care  Materials Provided: Yes: verbal education about caregiver resource options provided by phone  Patient verbalizes understanding of instructions and care plan provided today and agrees to view in [place]. Active MyChart status and patient understanding of how to access instructions and care plan via MyChart confirmed with patient.     Follow Up Plan:  No SW follow up planned at this time. Please contact me as needed.   Daneen Schick, BSW, CDP Social Worker, Certified Dementia Practitioner [facility] [phone]

## 2022-04-18 NOTE — Chronic Care Management (AMB) (Signed)
Chronic Care Management    Social Work Note  04/18/2022 Name: Tonya Rogers MRN: [id number] DOB: 1936/03/19  Tonya Rogers is a 86 y.o. year old female who is a primary care patient of Rita Ohara, MD. The CCM team was consulted to assist the patient with chronic disease management and/or care coordination needs related to:  Generalized Weakness, In-Transit Metastasis from Malignant Melanoma of Skin .   Engaged with patients daughter and caregiver [name] by phone  for follow up visit in response to provider referral for social work chronic care management and care coordination services.   Consent to Services:  The patient was given information about Chronic Care Management services, agreed to services, and gave verbal consent prior to initiation of services.  Please see initial visit note for detailed documentation.   Patient agreed to services and consent obtained.   Assessment: Review of patient past medical history, allergies, medications, and health status, including review of relevant consultants reports was performed today as part of a comprehensive evaluation and provision of chronic care management and care coordination services.     SDOH (Social Determinants of Health) assessments and interventions performed:    Advanced Directives Status: Not addressed in this encounter.  CCM Care Plan  No Known Allergies  Outpatient Encounter Medications as of 04/18/2022  Medication Sig Note   binimetinib (MEKTOVI) 15 MG tablet Take 30 mg by mouth 2 (two) times daily. 04/05/2022: Last dose yesterday   BIOTIN PO Take 1 tablet by mouth daily.    Calcium Carbonate-Vitamin D 600-200 MG-UNIT TABS Take 1 tablet by mouth daily.    Cholecalciferol (VITAMIN D) 1000 UNITS capsule Take 1,000 Units by mouth daily.    Coenzyme Q10 (CO Q 10) 100 MG CAPS Take 1 capsule by mouth daily.    encorafenib (BRAFTOVI) 75 MG capsule Take 300 mg by mouth daily. 04/05/2022: Last dose was 2 days ago   Exenatide  ER (BYDUREON BCISE) 2 MG/0.85ML AUIJ INJECT 1 PEN SUBCUTANEOUSLY ONCE A WEEK    fish oil-omega-3 fatty acids 1000 MG capsule Take 3 g by mouth daily.    fluconazole (DIFLUCAN) 200 MG tablet Take 200 mg by mouth daily. 03/29/2022: Started 03/24/22   glucose blood test strip 1 each by Other route 2 (two) times daily. Test twice daily    losartan (COZAAR) 100 MG tablet TAKE 1 TABLET BY MOUTH DAILY (Patient not taking: Reported on 03/26/2022)    magnesium gluconate (MAGONATE) 500 MG tablet Take 500 mg by mouth daily.    metFORMIN (GLUCOPHAGE) 1000 MG tablet TAKE 1 TABLET BY MOUTH  TWICE DAILY WITH MEALS    Multiple Vitamins-Minerals (EYE VITAMINS PO) Take 1 capsule by mouth daily.    Multiple Vitamins-Minerals (MULTIVITAMIN WITH MINERALS) tablet Take 1 tablet by mouth daily.    NON FORMULARY Take 1 capsule by mouth in the morning and at bedtime. 04/05/2022: Hemp Oil and Turmeric curcumin, black pepper w/bioperine '6000mg'$    pioglitazone (ACTOS) 15 MG tablet Take 1 tablet (15 mg total) by mouth daily.    simvastatin (ZOCOR) 20 MG tablet Take 1 tablet (20 mg total) by mouth daily.    SYNTHROID 50 MCG tablet TAKE 1 TABLET DAILY BEFORE BREAKFAST FOR HYPOTHYROIDISM    vitamin C (ASCORBIC ACID) 500 MG tablet Take 500 mg by mouth daily.    No facility-administered encounter medications on file as of 04/18/2022.    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[place]) 10/12/2011    Type II or unspecified type diabetes mellitus without mention of complication, not stated as uncontrolled 07/12/2011   Pure hypercholesterolemia 07/12/2011   Essential hypertension, benign 07/12/2011    Conditions to be addressed/monitored:  Generalized Weakness, In-Transit Metastasis from Malignant Melanoma of Skin ; Limited access to caregiver  Care Plan : Social Work Plan of Care  Updates made by Daneen Schick since 04/18/2022 12:00 AM  Completed 04/18/2022   Problem: Quality of Life (General Plan of Care) Resolved 04/18/2022     Goal: Quality of Life Maintained Completed 04/18/2022  Start Date: 04/12/2022  Priority: High  Note:   Current Barriers:  Chronic disease management support and education needs related to  Generalized Weakness, In-transit Metastasis from Malignant Melanoma of Skin, DM II   Limited access to caregiver  Social Worker Clinical Goal(s):  patient will work with SW to identify and address any acute and/or chronic care coordination needs related to the self health management of  Generalized Weakness and In-Transit Metastasis from Malignant Melanoma of Skin   patient will work with SW to address concerns related to caregiver access  SW Interventions:  Inter-disciplinary care team collaboration (see longitudinal plan of care) Collaboration with Rita Ohara, MD regarding development and update of comprehensive plan of care as evidenced by provider attestation and co-signature Collaboration with [place] Director Hermine Messick who indicates they could service the patient but would need 24-48 hours notice and require a 4 hour minimum Telephonic visit completed with the patients daughter [name] to advise the "Caregiver On call" program is no longer offered under [facility] Discussed that although the patient has been opposed to having a caregiver in the home it may be beneficial to enroll with an agency to have an assessment completed so that  if/when the patient needs assistance she is established and can arrange for a caregiver Reviewed most agencies require 24-48 hour notice and a 4 hour minimum of caregiver time each day the caregiver assists a patient Discussed that as the patient continues chemotherapy if a pattern develops of weakness specific days after treatment the patient could plan to arrange a caregiver in the home during that time- daughter [name] agreeable and open to looking at agencies Provided [name] with contact information to [facility], [facility], and [facility] to determine if either agency is suitable for the patients needs Discussed the plan for SW to sign off at this time with option for [name] to contact SW directly with future care coordination needs  Collaboration with Consulting civil engineer to advise of interventions and plan  Patient Goals/Self-Care Activities patient will: with the help of her daughter Financial controller caregiver agencies to determine which may be a good fit for future needs -Engage with RN Care Manager to develop an individualized plan of care        Follow Up Plan:  No SW follow up planned at this time. The patient will be engaged by RN Care Manager to address care management needs. SW is available to assist as needed with future care coordination needs.  Daneen Schick, BSW, CDP Social Worker, Certified Dementia Practitioner  [place] [facility] [phone]

## 2022-04-19 ENCOUNTER — Telehealth: Payer: Medicare Other

## 2022-04-20 DIAGNOSIS — C779 Secondary and unspecified malignant neoplasm of lymph node, unspecified: Secondary | ICD-10-CM | POA: Diagnosis not present

## 2022-04-20 DIAGNOSIS — R918 Other nonspecific abnormal finding of lung field: Secondary | ICD-10-CM | POA: Diagnosis not present

## 2022-04-20 DIAGNOSIS — E611 Iron deficiency: Secondary | ICD-10-CM | POA: Diagnosis not present

## 2022-04-20 DIAGNOSIS — E039 Hypothyroidism, unspecified: Secondary | ICD-10-CM | POA: Diagnosis not present

## 2022-04-20 DIAGNOSIS — C4371 Malignant melanoma of right lower limb, including hip: Secondary | ICD-10-CM | POA: Diagnosis not present

## 2022-04-20 DIAGNOSIS — R946 Abnormal results of thyroid function studies: Secondary | ICD-10-CM | POA: Diagnosis not present

## 2022-04-20 DIAGNOSIS — D500 Iron deficiency anemia secondary to blood loss (chronic): Secondary | ICD-10-CM | POA: Diagnosis not present

## 2022-04-20 DIAGNOSIS — R5383 Other fatigue: Secondary | ICD-10-CM | POA: Diagnosis not present

## 2022-04-20 DIAGNOSIS — R748 Abnormal levels of other serum enzymes: Secondary | ICD-10-CM | POA: Diagnosis not present

## 2022-04-20 DIAGNOSIS — C439 Malignant melanoma of skin, unspecified: Secondary | ICD-10-CM | POA: Diagnosis not present

## 2022-04-20 DIAGNOSIS — Z79890 Hormone replacement therapy (postmenopausal): Secondary | ICD-10-CM | POA: Diagnosis not present

## 2022-04-24 ENCOUNTER — Other Ambulatory Visit: Payer: Self-pay | Admitting: Family Medicine

## 2022-04-24 DIAGNOSIS — Z5181 Encounter for therapeutic drug level monitoring: Secondary | ICD-10-CM

## 2022-04-25 DIAGNOSIS — D649 Anemia, unspecified: Secondary | ICD-10-CM | POA: Diagnosis not present

## 2022-04-25 DIAGNOSIS — C439 Malignant melanoma of skin, unspecified: Secondary | ICD-10-CM | POA: Diagnosis not present

## 2022-04-25 DIAGNOSIS — Z9889 Other specified postprocedural states: Secondary | ICD-10-CM | POA: Diagnosis not present

## 2022-04-25 DIAGNOSIS — E118 Type 2 diabetes mellitus with unspecified complications: Secondary | ICD-10-CM

## 2022-04-25 DIAGNOSIS — C779 Secondary and unspecified malignant neoplasm of lymph node, unspecified: Secondary | ICD-10-CM | POA: Diagnosis not present

## 2022-04-25 DIAGNOSIS — Z9229 Personal history of other drug therapy: Secondary | ICD-10-CM | POA: Diagnosis not present

## 2022-04-25 DIAGNOSIS — Z85820 Personal history of malignant melanoma of skin: Secondary | ICD-10-CM | POA: Diagnosis not present

## 2022-05-02 DIAGNOSIS — Z79899 Other long term (current) drug therapy: Secondary | ICD-10-CM | POA: Diagnosis not present

## 2022-05-02 DIAGNOSIS — R5383 Other fatigue: Secondary | ICD-10-CM | POA: Diagnosis not present

## 2022-05-02 DIAGNOSIS — E039 Hypothyroidism, unspecified: Secondary | ICD-10-CM | POA: Diagnosis not present

## 2022-05-02 DIAGNOSIS — K5900 Constipation, unspecified: Secondary | ICD-10-CM | POA: Diagnosis not present

## 2022-05-02 DIAGNOSIS — C4371 Malignant melanoma of right lower limb, including hip: Secondary | ICD-10-CM | POA: Diagnosis not present

## 2022-05-02 DIAGNOSIS — D500 Iron deficiency anemia secondary to blood loss (chronic): Secondary | ICD-10-CM | POA: Diagnosis not present

## 2022-05-02 DIAGNOSIS — C779 Secondary and unspecified malignant neoplasm of lymph node, unspecified: Secondary | ICD-10-CM | POA: Diagnosis not present

## 2022-05-03 ENCOUNTER — Encounter: Payer: Self-pay | Admitting: Family Medicine

## 2022-05-04 ENCOUNTER — Encounter: Payer: Self-pay | Admitting: Family Medicine

## 2022-05-08 ENCOUNTER — Telehealth: Payer: Medicare Other

## 2022-05-08 ENCOUNTER — Ambulatory Visit (INDEPENDENT_AMBULATORY_CARE_PROVIDER_SITE_OTHER): Payer: BLUE CROSS/BLUE SHIELD

## 2022-05-08 DIAGNOSIS — R531 Weakness: Secondary | ICD-10-CM

## 2022-05-08 DIAGNOSIS — C779 Secondary and unspecified malignant neoplasm of lymph node, unspecified: Secondary | ICD-10-CM

## 2022-05-08 DIAGNOSIS — E118 Type 2 diabetes mellitus with unspecified complications: Secondary | ICD-10-CM

## 2022-05-08 NOTE — Progress Notes (Signed)
Chief Complaint  Patient presents with   Follow-up    Follow up on labs. Wants referral for podiatry     Patient presents to discuss recent thyroid labs done at [facility].  She is currently on 35mcg daily of branded Synthroid.  She continues to take medication on an empty stomach, separate from other medications.  Her chemo agents may be affecting this, but these have been on hold (see below). Overall she is doing better--improved energy, no longer having as much mouth discomfort, eating better. Myalgias resolved upon stopping the chemo (and CK levels normalized).  Constipation is chronic, no recent changes. No significant changes to hair (chronic alopecia).  Some increased brittle nails, but also a SE from chemo, as is peeling of skin. Moods have remained good. No weight changes.  TSH was repeated today at [facility], 18.77.  It had been 22.08 on 05/02/22.  At that time, free T4 was at the lower end of normal, 0.7, normal T3 uptake, T4 total and free thyroxine index.  TSH was 17.76 5/26 with normal free T4; 16.01 on 03/30/2022, 5.95 in 01/2022. It was 2.15 in 10/2021 with an elevated free T4 at 2.1  She is requesting B12 level to be checked. She is a diabetic and on metformin. It doesn't appear that this has been checked in the past, but she had no symptoms, and no anemia (until recently) or macrocytosis to suggest an issue. She previously took B12 supplements (many years ago), does take MVI daily. Not having any memory issues. She has some numbness and tingling in her hands only since starting chemo (after being on it a few months).  She has had issues with mouth pain related to chemo, still a little sensitive.  No longer has any tongue pain.  She is concerned about her toenails.  Since on chemo she has some more flaking and peeling of the skin (on her leg and around the toes).  She has had thickened toenails since a trip 4-5 years ago (wore hiking shoes for a month straight). She denies ingrowing nails or  pain. Discolored and thickened.  05/02/22 visit with Dr. Ethelene Hal got Injectafer infusion at that visit. She had a 2nd infusion today. His plan--Continue to HOLD encorafenib and binimetinib. Plan to restart May 14, 2022 at a decreased dose of encorafenib $RemoveBefore'225mg'[id number]$  qd (50% dose reduction) and binimetinib 15 mg bid (66% dose reduction).   Other labs done at [facility]: Today--Hg 9.8, plt 436 Up from Hg 9.4 on 6/7 Other labs from 6/7--low iron (12), ferritin (4), 3% saturation, normal retic.  C-met today normal, glu 114, also normal 6/7. Last CK level was back down to normal (50) on 04/20/22    PMH, PSH, SH reviewed  Outpatient Encounter Medications as of 05/09/2022  Medication Sig Note   BIOTIN PO Take 1 tablet by mouth daily.    Calcium Carbonate-Vitamin D 600-200 MG-UNIT TABS Take 1 tablet by mouth daily.    Cholecalciferol (VITAMIN D) 1000 UNITS capsule Take 1,000 Units by mouth daily.    Coenzyme Q10 (CO Q 10) 100 MG CAPS Take 1 capsule by mouth daily.    Exenatide ER (BYDUREON BCISE) 2 MG/0.85ML AUIJ INJECT 1 PEN SUBCUTANEOUSLY ONCE A WEEK    fish oil-omega-3 fatty acids 1000 MG capsule Take 3 g by mouth daily.    glucose blood test strip 1 each by Other route 2 (two) times daily. Test twice daily    magnesium gluconate (MAGONATE) 500 MG tablet Take 500 mg by  mouth daily.    metFORMIN (GLUCOPHAGE) 1000 MG tablet TAKE 1 TABLET BY MOUTH TWICE  DAILY WITH MEALS    Multiple Vitamins-Minerals (EYE VITAMINS PO) Take 1 capsule by mouth daily.    Multiple Vitamins-Minerals (MULTIVITAMIN WITH MINERALS) tablet Take 1 tablet by mouth daily.    NON FORMULARY Take 1 capsule by mouth in the morning and at bedtime. 04/05/2022: Hemp Oil and Turmeric curcumin, black pepper w/bioperine 6000mg    pioglitazone (ACTOS) 15 MG tablet Take 1 tablet (15 mg total) by mouth daily.    simvastatin (ZOCOR) 20 MG tablet Take 1 tablet (20 mg total) by mouth daily.    vitamin C (ASCORBIC ACID) 500 MG tablet Take 500  mg by mouth daily.    [DISCONTINUED] SYNTHROID 50 MCG tablet TAKE 1 TABLET DAILY BEFORE BREAKFAST FOR HYPOTHYROIDISM    binimetinib (MEKTOVI) 15 MG tablet Take 30 mg by mouth 2 (two) times daily. (Patient not taking: Reported on 05/09/2022)    encorafenib (BRAFTOVI) 75 MG capsule Take 300 mg by mouth daily. (Patient not taking: Reported on 05/09/2022) 04/05/2022: Last dose was 2 days ago   fluconazole (DIFLUCAN) 200 MG tablet Take 200 mg by mouth daily. (Patient not taking: Reported on 05/09/2022)    losartan (COZAAR) 100 MG tablet TAKE 1 TABLET BY MOUTH DAILY (Patient not taking: Reported on 03/26/2022)    SYNTHROID 50 MCG tablet TAKE 1 TABLET DAILY BEFORE BREAKFAST FOR HYPOTHYROIDISM.  TAKE 2 TABLETS ON SUNDAYS    No facility-administered encounter medications on file as of 05/09/2022.   No Known Allergies  ROS:  no fever, chills, URI symptoms, headaches, dizziness, chest pain, shortness of breath. Some fatigue, but improving. Mouth is sensitive, but no longer as painful. Numbness and tingling in fingers and toes since the chemo Thickened toenails.  Recently with peeling of skin on the RLE, and around the L great toenail, thinks related to chemo.  See HPI   PHYSICAL EXAM:  BP 120/60   Pulse 93   Wt 142 lb (64.4 kg)   SpO2 100%   BMI 28.20 kg/m   Wt Readings from Last 3 Encounters:  05/09/22 142 lb (64.4 kg)  04/05/22 142 lb 6.4 oz (64.6 kg)  03/29/22 143 lb 9.6 oz (65.1 kg)    Elderly female, in no distress. Hard of hearing. Accompanied by her daughter. HEENT: conjunctiva and sclera are clear, EOMI Neck: no lymphadenopathy, thyromegaly or mass Heart: regular rate and rhythm Lungs: clear bilaterally Extremities: no edema. +thickened toenails x 10.  Some white discoloration/peeling around the nailbeds, most prominent at the L great toenail. Some peeling of skin on the R shin.  No sign of infection. Neuro: alert and oriented, cranial nerves grossly intact.   Skin: normal turgor, no  rashes Psych: normal mood, affect, hygiene and grooming.    ASSESSMENT/PLAN:  Hypothyroidism, unspecified type - elevated TSH, low-normal fT4.  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  Melanoma of skin (HCC)  Iron deficiency anemia, unspecified iron deficiency anemia type - received 2 iron infusions per heme-onc at [facility]  Onychomycosis - educated; disc treatment, but asymptomatic.  Could use assistance with nail care, diabetic.  - Plan: Ambulatory referral to Podiatry  Controlled type 2 diabetes mellitus with complication, without long-term current use of insulin ([place]) - Plan: Ambulatory  referral to Podiatry  Medication monitoring encounter - Plan: Vitamin B12  Paresthesias - pt relates this to her chemo.  no weakness - Plan: Vitamin B12  TSH is regularly monitored with [facility] labs. Pt/daughter will let us know when she had labs so I can review in [place].  Will check B12--pt is on metformin, should be checked.  Doubt it will be low, as up until recently has had normal CBC, normal indices, takes MVI.  F/u as scheduled in October  I spent 34 minutes dedicated to the care of this patient, including pre-visit review of records, face to face time, post-visit ordering of testing and documentation.

## 2022-05-09 ENCOUNTER — Encounter: Payer: Self-pay | Admitting: Family Medicine

## 2022-05-09 ENCOUNTER — Ambulatory Visit (INDEPENDENT_AMBULATORY_CARE_PROVIDER_SITE_OTHER): Payer: Medicare Other | Admitting: Family Medicine

## 2022-05-09 VITALS — BP 120/60 | HR 93 | Wt 142.0 lb

## 2022-05-09 DIAGNOSIS — R202 Paresthesia of skin: Secondary | ICD-10-CM | POA: Diagnosis not present

## 2022-05-09 DIAGNOSIS — C439 Malignant melanoma of skin, unspecified: Secondary | ICD-10-CM

## 2022-05-09 DIAGNOSIS — Z5181 Encounter for therapeutic drug level monitoring: Secondary | ICD-10-CM | POA: Diagnosis not present

## 2022-05-09 DIAGNOSIS — B351 Tinea unguium: Secondary | ICD-10-CM | POA: Diagnosis not present

## 2022-05-09 DIAGNOSIS — C779 Secondary and unspecified malignant neoplasm of lymph node, unspecified: Secondary | ICD-10-CM

## 2022-05-09 DIAGNOSIS — D509 Iron deficiency anemia, unspecified: Secondary | ICD-10-CM | POA: Diagnosis not present

## 2022-05-09 DIAGNOSIS — E039 Hypothyroidism, unspecified: Secondary | ICD-10-CM | POA: Diagnosis not present

## 2022-05-09 DIAGNOSIS — E118 Type 2 diabetes mellitus with unspecified complications: Secondary | ICD-10-CM | POA: Diagnosis not present

## 2022-05-09 DIAGNOSIS — Z79899 Other long term (current) drug therapy: Secondary | ICD-10-CM | POA: Diagnosis not present

## 2022-05-09 DIAGNOSIS — D500 Iron deficiency anemia secondary to blood loss (chronic): Secondary | ICD-10-CM | POA: Diagnosis not present

## 2022-05-09 MED ORDER — SYNTHROID 50 MCG PO TABS: ORAL_TABLET | ORAL | 0 refills | Status: DC

## 2022-05-09 NOTE — Chronic Care Management (AMB) (Signed)
Chronic Care Management   CCM RN Visit Note  05/09/2022 Name: Tonya Rogers MRN: [id number] DOB: 1936-10-21  Subjective: Tonya Rogers is a 86 y.o. year old female who is a primary care patient of Rita Ohara, MD. The care management team was consulted for assistance with disease management and care coordination needs.    Engaged with patient by telephone for initial visit in response to provider referral for case management and/or care coordination services.   Consent to Services:  The patient was given information about Chronic Care Management services, agreed to services, and gave verbal consent prior to initiation of services.  Please see initial visit note for detailed documentation.   Patient agreed to services and verbal consent obtained.   Assessment: Review of patient past medical history, allergies, medications, health status, including review of consultants reports, laboratory and other test data, was performed as part of comprehensive evaluation and provision of chronic care management services.   SDOH (Social Determinants of Health) assessments and interventions performed:  Yes, no acute needs   CCM Care Plan  No Known Allergies  Outpatient Encounter Medications as of 05/08/2022  Medication Sig Note   binimetinib (MEKTOVI) 15 MG tablet Take 30 mg by mouth 2 (two) times daily. 04/05/2022: Last dose yesterday   BIOTIN PO Take 1 tablet by mouth daily.    Calcium Carbonate-Vitamin D 600-200 MG-UNIT TABS Take 1 tablet by mouth daily.    Cholecalciferol (VITAMIN D) 1000 UNITS capsule Take 1,000 Units by mouth daily.    Coenzyme Q10 (CO Q 10) 100 MG CAPS Take 1 capsule by mouth daily.    encorafenib (BRAFTOVI) 75 MG capsule Take 300 mg by mouth daily. 04/05/2022: Last dose was 2 days ago   Exenatide ER (BYDUREON BCISE) 2 MG/0.85ML AUIJ INJECT 1 PEN SUBCUTANEOUSLY ONCE A WEEK    fish oil-omega-3 fatty acids 1000 MG capsule Take 3 g by mouth daily.    fluconazole (DIFLUCAN)  200 MG tablet Take 200 mg by mouth daily. 03/29/2022: Started 03/24/22   glucose blood test strip 1 each by Other route 2 (two) times daily. Test twice daily    losartan (COZAAR) 100 MG tablet TAKE 1 TABLET BY MOUTH DAILY (Patient not taking: Reported on 03/26/2022)    magnesium gluconate (MAGONATE) 500 MG tablet Take 500 mg by mouth daily.    metFORMIN (GLUCOPHAGE) 1000 MG tablet TAKE 1 TABLET BY MOUTH TWICE  DAILY WITH MEALS    Multiple Vitamins-Minerals (EYE VITAMINS PO) Take 1 capsule by mouth daily.    Multiple Vitamins-Minerals (MULTIVITAMIN WITH MINERALS) tablet Take 1 tablet by mouth daily.    NON FORMULARY Take 1 capsule by mouth in the morning and at bedtime. 04/05/2022: Hemp Oil and Turmeric curcumin, black pepper w/bioperine '6000mg'$    pioglitazone (ACTOS) 15 MG tablet Take 1 tablet (15 mg total) by mouth daily.    simvastatin (ZOCOR) 20 MG tablet Take 1 tablet (20 mg total) by mouth daily.    SYNTHROID 50 MCG tablet TAKE 1 TABLET DAILY BEFORE BREAKFAST FOR HYPOTHYROIDISM    vitamin C (ASCORBIC ACID) 500 MG tablet Take 500 mg by mouth daily.    No facility-administered encounter medications on file as of 05/08/2022.    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   Type II or unspecified type diabetes mellitus without mention of complication, not stated as uncontrolled 07/12/2011   Pure hypercholesterolemia 07/12/2011   Essential  hypertension, benign 07/12/2011    Conditions to be addressed/monitored: Generalized weakness, 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, DM II  Care Plan : [place] of Care  Updates made by Lynne Logan, RN since 05/09/2022 12:00 AM     Problem: No plan of care established for management of chronic disease states (Generalized weakness, 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)   Priority: High     Long-Range Goal: Establishment of plan of care for management of chronic disease states (Generalized weakness, 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)   Start Date: 04/12/2022  Expected End Date: 04/12/2023  Recent Progress: On track  Priority: High  Note:   Current Barriers:  Knowledge Deficits related to plan of care for management of Generalized weakness, 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  Care Coordination needs related to Limited access to caregiver and Lacks knowledge of community resource: home delivered meals  Chronic Disease Management support and education needs related to Generalized weakness, 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   RNCM Clinical Goal(s):  Patient will continue to work with RN Care Manager to address care management and care coordination needs related to  Generalized weakness, 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   Interventions: 1:1 collaboration with primary care provider regarding development and update of  comprehensive plan of care as evidenced by provider attestation and co-signature Inter-disciplinary care team collaboration (see longitudinal plan of care) Evaluation of current treatment plan related to  self management and patient's adherence to plan as established by provider  Interdisciplinary Collaboration Interventions:  (Status: Goal on track:  Yes.) Long Term Goal   Collaborated with BSW to initiate plan of care to address needs related to Limited access to caregiver and Lacks knowledge of community resource: home delivered meals  in patient with  Generalized weakness, 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 Collaboration with Rita Ohara, MD regarding development and update of comprehensive plan of care as evidenced by provider attestation and co-signature Inter-disciplinary care team collaboration    Anemia/Bleeding Interventions:  (Status:  New goal.) Long Term Goal  Assessment of understanding of anemia/bleeding disorder diagnosis  Basic overview and discussion of anemia/bleeding disorder or acute disease state  Medications reviewed  Counseled on avoidance of NSAIDs due to increased bleeding risk Counseled on importance of regular laboratory monitoring as directed by provider Provided education about signs and symptoms of active bleeding such as stomach discomfort, coughing up blood or blood tinged secretions, bleeding from the gums/teeth, nosebleeds, increased bruising, blood in the urine/stool and/or if a traumatic injury occurs, regardless of severity of injury  Advised to call provider or 911 if active bleeding or signs and  symptoms of active bleeding occur recommended promotion of rest and energy-conserving measures to manage fatigue, such as balancing activity with periods of rest encouraged optimal oral intake to support fluid balance and nutrition encouraged dietary changes to increase dietary intake of iron, Vitamin H60 and folic acid as  advised/prescribed Assessed social determinant of health barriers  Collaboration with PCP regarding patient's request to have her Vitamin B12 checked at next office visit  Discussed plans with patient for ongoing care management follow up and provided patient with direct contact information for care management team Lab Results  Component Value Date   WBC 5.3 04/09/2022   HGB 9.9 (L) 04/09/2022   HCT 30.2 (L) 04/09/2022   MCV 81 04/09/2022   PLT 466 (H) 04/09/2022  Oncology:  (Status: New goal.) Long Term Goal Assessment of understanding of oncology diagnosis:  Assessed patient understanding of cancer diagnosis and recommended treatment plan, Reviewed upcoming provider appointments and treatment appointments, Assessed available transportation to appointments and treatments. Has consistent/reliable transportation: Yes, Assessed support system. Has consistent/reliable family or other support: Yes, and Nutrition assessment performed Assessed for SDOH barriers, none identified at this time, BSW provided caregiver resources to daughter [name] for future use if needed  Discussed plans with patient for ongoing care management follow up and provided patient with direct contact information for care management team  Patient Goals/Self-Care Activities: Take all medications as prescribed Attend all scheduled provider appointments Call pharmacy for medication refills 3-7 days in advance of running out of medications Call provider office for new concerns or questions  Work with the social worker to address care coordination needs and will continue to work with the clinical team to address health care and disease management related needs   Follow Up Plan:  Telephone follow up appointment with care management team member scheduled for:  06/05/22      Barb Merino, RN, BSN, CCM Care Management Coordinator [place]  Direct Phone: [phone]

## 2022-05-09 NOTE — Patient Instructions (Signed)
Visit Information  Thank you for taking time to visit with me today. Please don't hesitate to contact me if I can be of assistance to you before our next scheduled telephone appointment.  Following are the goals we discussed today:  (Copy and paste patient goals from clinical care plan here)  Our next appointment is by telephone on 06/05/22 at 11:20 AM   Please call the care guide team at [phone] if you need to cancel or reschedule your appointment.  call 1-800-273-TALK (toll free, 24 hour hotline) If you are experiencing a Mental Health or [place] or need someone to talk to, please call 1-800-273-TALK (toll free, 24 hour hotline)   Patient verbalizes understanding of instructions and care plan provided today and agrees to view in [place]. Active MyChart status and patient understanding of how to access instructions and care plan via MyChart confirmed with patient.     Barb Merino, RN, BSN, CCM Care Management Coordinator [place] [facility]  Direct Phone: [phone]

## 2022-05-09 NOTE — Patient Instructions (Signed)
Double up on your thyroid medication (take TWO of the 100mg tablets) once a week (Sundays). Please let me know when [place] labs [place] [facility] [place] that [place] them.

## 2022-05-10 ENCOUNTER — Encounter: Payer: Self-pay | Admitting: Family Medicine

## 2022-05-10 LAB — VITAMIN B12: Vitamin B-12: 338 pg/mL (ref 232–1245)

## 2022-05-30 DIAGNOSIS — L271 Localized skin eruption due to drugs and medicaments taken internally: Secondary | ICD-10-CM | POA: Diagnosis not present

## 2022-05-30 DIAGNOSIS — C779 Secondary and unspecified malignant neoplasm of lymph node, unspecified: Secondary | ICD-10-CM | POA: Diagnosis not present

## 2022-05-30 DIAGNOSIS — R918 Other nonspecific abnormal finding of lung field: Secondary | ICD-10-CM | POA: Diagnosis not present

## 2022-05-30 DIAGNOSIS — B3781 Candidal esophagitis: Secondary | ICD-10-CM | POA: Diagnosis not present

## 2022-05-30 DIAGNOSIS — C439 Malignant melanoma of skin, unspecified: Secondary | ICD-10-CM | POA: Diagnosis not present

## 2022-05-30 DIAGNOSIS — D500 Iron deficiency anemia secondary to blood loss (chronic): Secondary | ICD-10-CM | POA: Diagnosis not present

## 2022-05-30 DIAGNOSIS — Z79890 Hormone replacement therapy (postmenopausal): Secondary | ICD-10-CM | POA: Diagnosis not present

## 2022-05-30 DIAGNOSIS — M79641 Pain in right hand: Secondary | ICD-10-CM | POA: Diagnosis not present

## 2022-05-30 DIAGNOSIS — R5383 Other fatigue: Secondary | ICD-10-CM | POA: Diagnosis not present

## 2022-05-30 DIAGNOSIS — E039 Hypothyroidism, unspecified: Secondary | ICD-10-CM | POA: Diagnosis not present

## 2022-05-30 DIAGNOSIS — M79642 Pain in left hand: Secondary | ICD-10-CM | POA: Diagnosis not present

## 2022-05-30 DIAGNOSIS — C4371 Malignant melanoma of right lower limb, including hip: Secondary | ICD-10-CM | POA: Diagnosis not present

## 2022-06-01 ENCOUNTER — Encounter: Payer: Self-pay | Admitting: Family Medicine

## 2022-06-05 ENCOUNTER — Telehealth: Payer: Self-pay

## 2022-06-05 ENCOUNTER — Ambulatory Visit (INDEPENDENT_AMBULATORY_CARE_PROVIDER_SITE_OTHER): Payer: Medicare Other

## 2022-06-05 ENCOUNTER — Telehealth: Payer: Medicare Other

## 2022-06-05 DIAGNOSIS — C779 Secondary and unspecified malignant neoplasm of lymph node, unspecified: Secondary | ICD-10-CM

## 2022-06-05 DIAGNOSIS — E118 Type 2 diabetes mellitus with unspecified complications: Secondary | ICD-10-CM

## 2022-06-05 DIAGNOSIS — R531 Weakness: Secondary | ICD-10-CM

## 2022-06-05 DIAGNOSIS — D509 Iron deficiency anemia, unspecified: Secondary | ICD-10-CM

## 2022-06-05 NOTE — Patient Instructions (Addendum)
Visit Information  Thank you for taking time to visit with me today. Please don't hesitate to contact me if I can be of assistance to you before our next scheduled telephone appointment.  Following are the goals we discussed today:  Take all medications as prescribed Attend all scheduled provider appointments Call pharmacy for medication refills 3-7 days in advance of running out of medications Perform all self care activities independently  Call provider office for new concerns or questions  Our next appointment is by telephone on 07/03/22 at 2:00 PM   Please call the care guide team at [phone] if you need to cancel or reschedule your appointment.   If you are experiencing a Mental Health or [place] or need someone to talk to, please call 1-800-273-TALK (toll free, 24 hour hotline)   Patient verbalizes understanding of instructions and care plan provided today and agrees to view in [place]. Active MyChart status and patient understanding of how to access instructions and care plan via MyChart confirmed with patient.     Barb Merino, RN, BSN, CCM Care Management Coordinator [place] [facility] Direct Phone: [phone]

## 2022-06-05 NOTE — Chronic Care Management (AMB) (Signed)
Chronic Care Management   CCM RN Visit Note  06/05/2022 Name: Tonya Rogers MRN: [id number] DOB: 1936-08-14  Subjective: Tonya Rogers is a 86 y.o. year old female who is a primary care patient of Rita Ohara, MD. The care management team was consulted for assistance with disease management and care coordination needs.    Engaged with patient by telephone for follow up visit in response to provider referral for case management and/or care coordination services.   Consent to Services:  The patient was given information about Chronic Care Management services, agreed to services, and gave verbal consent prior to initiation of services.  Please see initial visit note for detailed documentation.   Patient agreed to services and verbal consent obtained.   Assessment: Review of patient past medical history, allergies, medications, health status, including review of consultants reports, laboratory and other test data, was performed as part of comprehensive evaluation and provision of chronic care management services.   SDOH (Social Determinants of Health) assessments and interventions performed:  Yes, no acute needs  CCM Care Plan  No Known Allergies  Outpatient Encounter Medications as of 06/05/2022  Medication Sig Note   binimetinib (MEKTOVI) 15 MG tablet Take 30 mg by mouth 2 (two) times daily. (Patient not taking: Reported on 05/09/2022)    BIOTIN PO Take 1 tablet by mouth daily.    Calcium Carbonate-Vitamin D 600-200 MG-UNIT TABS Take 1 tablet by mouth daily.    Cholecalciferol (VITAMIN D) 1000 UNITS capsule Take 1,000 Units by mouth daily.    Coenzyme Q10 (CO Q 10) 100 MG CAPS Take 1 capsule by mouth daily.    encorafenib (BRAFTOVI) 75 MG capsule Take 300 mg by mouth daily. (Patient not taking: Reported on 05/09/2022) 04/05/2022: Last dose was 2 days ago   Exenatide ER (BYDUREON BCISE) 2 MG/0.85ML AUIJ INJECT 1 PEN SUBCUTANEOUSLY ONCE A WEEK    fish oil-omega-3 fatty acids 1000 MG  capsule Take 3 g by mouth daily.    fluconazole (DIFLUCAN) 200 MG tablet Take 200 mg by mouth daily. (Patient not taking: Reported on 05/09/2022)    glucose blood test strip 1 each by Other route 2 (two) times daily. Test twice daily    losartan (COZAAR) 100 MG tablet TAKE 1 TABLET BY MOUTH DAILY (Patient not taking: Reported on 03/26/2022)    magnesium gluconate (MAGONATE) 500 MG tablet Take 500 mg by mouth daily.    metFORMIN (GLUCOPHAGE) 1000 MG tablet TAKE 1 TABLET BY MOUTH TWICE  DAILY WITH MEALS    Multiple Vitamins-Minerals (EYE VITAMINS PO) Take 1 capsule by mouth daily.    Multiple Vitamins-Minerals (MULTIVITAMIN WITH MINERALS) tablet Take 1 tablet by mouth daily.    NON FORMULARY Take 1 capsule by mouth in the morning and at bedtime. 04/05/2022: Hemp Oil and Turmeric curcumin, black pepper w/bioperine '6000mg'$    pioglitazone (ACTOS) 15 MG tablet Take 1 tablet (15 mg total) by mouth daily.    simvastatin (ZOCOR) 20 MG tablet Take 1 tablet (20 mg total) by mouth daily.    SYNTHROID 50 MCG tablet TAKE 1 TABLET DAILY BEFORE BREAKFAST FOR HYPOTHYROIDISM.  TAKE 2 TABLETS ON SUNDAYS    vitamin C (ASCORBIC ACID) 500 MG tablet Take 500 mg by mouth daily.    No facility-administered encounter medications on file as of 06/05/2022.    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[place]) 10/12/2011   Type II or unspecified type diabetes mellitus without mention of  complication, not stated as uncontrolled 07/12/2011   Pure hypercholesterolemia 07/12/2011   Essential hypertension, benign 07/12/2011    Conditions to be addressed/monitored: Generalized weakness, 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, DM II, Iron deficiency anemia  Care Plan : RN Care Manager Plan of Care  Updates made by Lynne Logan, RN since 06/05/2022 12:00 AM     Problem: No plan of care established for management of chronic disease states (Generalized weakness, 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)   Priority: High     Long-Range Goal: Establishment of plan of care for management of chronic disease states (Generalized weakness, 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)   Start Date: 04/12/2022  Expected End Date: 04/12/2023  Recent Progress: On track  Priority: High  Note:   Current Barriers:  Knowledge Deficits related to plan of care for management of Generalized weakness, 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  Care Coordination needs related to Limited access to caregiver and Lacks knowledge of community resource: home delivered meals  Chronic Disease Management support and education needs related to Generalized weakness, 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   RNCM Clinical Goal(s):  Patient will continue to work with RN Care Manager to address care management and care coordination needs related to  Generalized weakness, 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   Interventions: 1:1 collaboration with primary care provider regarding development and update of comprehensive plan of care as evidenced by provider attestation and co-signature Inter-disciplinary care team collaboration (see longitudinal plan of care) Evaluation of current treatment plan related to  self management and patient's adherence to plan as established by provider  Interdisciplinary Collaboration Interventions:  (Status: Goal on track:  Yes.) Long Term Goal   Collaborated with BSW to initiate plan of care to address needs related to Limited access to caregiver and Lacks knowledge of community resource: home delivered meals  in patient with  Generalized weakness, 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 Collaboration with Rita Ohara, MD regarding development and update of comprehensive plan of care as evidenced by provider attestation and co-signature Inter-disciplinary care team collaboration    Anemia/Bleeding Interventions:  (Status:  Goal on track:  Yes.) Long Term Goal     Assessment of understanding of anemia/bleeding disorder diagnosis Review of patient status, including review of consultant's reports, relevant laboratory and other test results, and medications completed Determined patient verbalizes understanding of her condition and when to call her doctor with new symptoms or concerns Discussed plans with patient for ongoing care management follow up and provided patient with direct contact information for care management team Component Ref Range & Units 3 wk  ago  Vitamin B-12 232 - 1,245 pg/mL 338   CBC    Component Value Date/Time   WBC 5.3 04/09/2022 1507   WBC 6.6 03/18/2017 0818   RBC 3.72 (L) 04/09/2022 1507   RBC 4.75 03/18/2017 0818   HGB 9.9 (L) 04/09/2022 1507   HCT 30.2 (L) 04/09/2022 1507   PLT 466 (H) 04/09/2022 1507   MCV 81 04/09/2022 1507   MCH 26.6 04/09/2022 1507   MCH 29.5 03/18/2017 0818   MCHC 32.8  04/09/2022 1507   MCHC 32.6 03/18/2017 0818   RDW 15.7 (H) 04/09/2022 1507   LYMPHSABS 1.7 04/09/2022 1507   MONOABS 660 03/18/2017 0818   EOSABS 0.1 04/09/2022 1507   BASOSABS 0.1 04/09/2022 1507     Oncology:  (Status: Goal on track:  Yes.) Long Term Goal Assessment of understanding of oncology diagnosis:  Assessed patient understanding of cancer diagnosis and recommended treatment plan, Reviewed upcoming provider appointments and treatment appointments, Assessed available transportation to appointments and treatments. Has consistent/reliable transportation: Yes, Assessed support system. Has consistent/reliable family or other support: Yes, and Nutrition assessment performed Assessed for SDOH barriers, no acute needs identified Reviewed and discussed with patient MD recommendations for Oncology treatment and determined patient verbalizes understanding;  RECOMMENDATIONS Reviewed labs. Suspend targeted treatment due to intolerance. FU with Dr. Clovis Riley for TVEC injections Continue current levothyroxine per PCP. TSH trending back down.  Follow up: RTC in 2 months for visit and labs RTC sooner if problems. Discussed plans with patient for ongoing care management follow up and provided patient with direct contact information for care management team  Patient Goals/Self-Care Activities: Take all medications as prescribed Attend all scheduled provider appointments Call pharmacy for medication refills 3-7 days in advance of running out of medications Perform all self care activities independently  Call provider office for new concerns or questions   Follow Up Plan:  Telephone follow up appointment with care management team member scheduled for:  07/03/22     Barb Merino, RN, BSN, CCM Care Management Coordinator [place] Direct Phone: [phone]

## 2022-06-05 NOTE — Telephone Encounter (Signed)
  Care Management   Follow Up Note   06/05/2022 Name: Tonya Rogers MRN: [id number] DOB: Feb 17, 1936   Referred by: Rita Ohara, MD Reason for referral : Chronic Care Management (RN CM Follow up call )   An unsuccessful telephone outreach was attempted today. The patient was referred to the case management team for assistance with care management and care coordination.   Follow Up Plan: A HIPPA compliant phone message was left for the patient providing contact information and requesting a return call.   Barb Merino, RN, BSN, CCM Care Management Coordinator [place] [facility]  Direct Phone: [phone]

## 2022-06-19 ENCOUNTER — Telehealth: Payer: Medicare Other

## 2022-06-20 ENCOUNTER — Ambulatory Visit: Payer: Medicare Other | Admitting: Podiatry

## 2022-06-25 DIAGNOSIS — D509 Iron deficiency anemia, unspecified: Secondary | ICD-10-CM

## 2022-06-25 DIAGNOSIS — E118 Type 2 diabetes mellitus with unspecified complications: Secondary | ICD-10-CM

## 2022-06-27 DIAGNOSIS — C439 Malignant melanoma of skin, unspecified: Secondary | ICD-10-CM | POA: Diagnosis not present

## 2022-06-27 DIAGNOSIS — Z9229 Personal history of other drug therapy: Secondary | ICD-10-CM | POA: Diagnosis not present

## 2022-06-27 DIAGNOSIS — Z9889 Other specified postprocedural states: Secondary | ICD-10-CM | POA: Diagnosis not present

## 2022-06-27 DIAGNOSIS — C779 Secondary and unspecified malignant neoplasm of lymph node, unspecified: Secondary | ICD-10-CM | POA: Diagnosis not present

## 2022-07-03 ENCOUNTER — Ambulatory Visit (INDEPENDENT_AMBULATORY_CARE_PROVIDER_SITE_OTHER): Payer: Medicare Other | Admitting: Podiatry

## 2022-07-03 ENCOUNTER — Other Ambulatory Visit: Payer: Self-pay

## 2022-07-03 ENCOUNTER — Ambulatory Visit: Payer: Self-pay

## 2022-07-03 ENCOUNTER — Telehealth: Payer: Self-pay | Admitting: Podiatry

## 2022-07-03 ENCOUNTER — Encounter: Payer: Self-pay | Admitting: Podiatry

## 2022-07-03 DIAGNOSIS — M79675 Pain in left toe(s): Secondary | ICD-10-CM

## 2022-07-03 DIAGNOSIS — E1149 Type 2 diabetes mellitus with other diabetic neurological complication: Secondary | ICD-10-CM

## 2022-07-03 DIAGNOSIS — M79674 Pain in right toe(s): Secondary | ICD-10-CM

## 2022-07-03 DIAGNOSIS — B351 Tinea unguium: Secondary | ICD-10-CM

## 2022-07-03 MED ORDER — CICLOPIROX 8 % EX SOLN: Freq: Every day | CUTANEOUS | 0 refills | Status: DC

## 2022-07-03 NOTE — Progress Notes (Signed)
  Subjective:  Patient ID: Tonya Rogers, female    DOB: October 10, 1936,   MRN: [id number]  Chief Complaint  Patient presents with   Diabetes    Diabetic foot care  A1C 6.3 FBS - 98 PCP-July 2023  Possible fungus - disoloration , thickness in toes     86 y.o. female presents for concern of thickened elongated and painful nails that are difficult to trim. Requesting to have them trimmed today. Relates burning and tingling in their feet. Patient is diabetic and last A1c was  Lab Results  Component Value Date   HGBA1C 6.1 (H) 02/26/2022   .   PCP:  Rita Ohara, MD  Last seen 05/09/22  . Denies any other pedal complaints. Denies n/v/f/c.   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 Rip Harbour   Osteoporosis    Pulmonary nodules    small, seen on CT 12/09   Pure hypercholesterolemia     Objective:  Physical Exam: Vascular: DP/PT pulses 2/4 bilateral. CFT <3 seconds. Absent hair growth on digits. Edema noted to bilateral lower extremities. Xerosis noted bilaterally.  Skin. No lacerations or abrasions bilateral feet. Nails 1-5 bilateral  are thickened discolored and elongated with subungual debris.  Musculoskeletal: MMT 5/5 bilateral lower extremities in DF, PF, Inversion and Eversion. Deceased ROM in DF of ankle joint.  Neurological: Sensation intact to light touch. Protective sensation  diminished bilateral.    Assessment:   1. Pain due to onychomycosis of toenails of both feet   2. 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 Onychomycosis      Plan:  Patient was evaluated and treated and all questions answered. -Discussed and educated patient on diabetic foot care, especially with  regards to the vascular, neurological and musculoskeletal systems.  -Stressed the importance of good glycemic control and the detriment of not  controlling glucose levels in relation to the foot. -Discussed supportive shoes at all times and checking feet regularly.  -Mechanically debrided all nails 1-5 bilateral using sterile nail nipper and filed with dremel without incident  -Penlac provided to see if this will help clear nails a bit.  -Answered all patient questions -Patient to return  in 3 months for at risk foot care -Patient advised to call the office if any problems or questions arise in the meantime.   Lorenda Peck, DPM

## 2022-07-03 NOTE — Progress Notes (Signed)
This encounter was created in error - please disregard.

## 2022-07-03 NOTE — Telephone Encounter (Signed)
Pt would like medication sent to local pharmacy.  ciclopirox (PENLAC) 8 % solution   [id number] - [place], [place] - [address] AT [address]  Please advise.

## 2022-07-03 NOTE — Patient Outreach (Signed)
  Care Coordination   Follow Up Visit Note   07/03/2022 Name: Tonya Rogers MRN: [id number] DOB: 10-06-1936  Tonya Rogers is a 86 y.o. year old female who sees Tonya Ohara, MD for primary care. I spoke with  Tonya Rogers by phone today  What matters to the patients health and wellness today?  Patient is hopeful her new cancer treatment will be effective without side effects.     Goals Addressed       Patient Stated     "To tolerate the new cancer therapy" (pt-stated)        Care Coordination Interventions: Assessment of understanding of oncology diagnosis:  Assessed patient understanding of cancer diagnosis and recommended treatment plan,  Reviewed upcoming provider appointments and treatment appointments, Assessed available transportation to appointments and treatments. Has consistent/reliable transportation: Yes, Assessed support system. Has consistent/reliable family or other support: Yes, and Nutrition assessment performed Assessed for SDOH barriers, no acute needs identified Reviewed and discussed with patient MD recommendations for Oncology treatment and determined patient verbalizes understanding;  ASSESSMENT/PLAN: Tonya Rogers, was evaluated today in clinic. She has had multiple in transit lesions resected over a decade. Currently holding encorafenib/binimetinib due to side effects. No acute need for surgical intervention, although further excisions are possible.. Will follow up with Dr. Baltazar Rogers regarding further medical therapy.   Could consider oncolytic virus vs repeat isolated limb infusion for persistent disease after full trial of pembrolizumab.  -RTC for TVEC injections to residual in transit lesions. Reviewed and discussed with patient, TVEC injection schedule; initial TVEC injection scheduled for 8/18, subsequent #2 injection will occur 3 weeks from initial and then every 2 weeks x 13 total injections Determined patient has a good understanding of her  prescribed treatment plan, discussed daughter Tonya Rogers will accompany patient to her appointments     SDOH assessments and interventions completed:  Yes     Care Coordination Interventions Activated:  Yes  Care Coordination Interventions:  Yes, provided   Follow up plan: Follow up call scheduled for 07/31/22 '@10'$ :00 AM     Encounter Outcome:  Pt. Visit Completed

## 2022-07-03 NOTE — Patient Instructions (Signed)
Visit Information  Thank you for taking time to visit with me today. Please don't hesitate to contact me if I can be of assistance to you.   Following are the goals we discussed today:   Goals Addressed       Patient Stated     "To tolerate the new cancer therapy" (pt-stated)        Care Coordination Interventions: Assessment of understanding of oncology diagnosis:  Assessed patient understanding of cancer diagnosis and recommended treatment plan,  Reviewed upcoming provider appointments and treatment appointments, Assessed available transportation to appointments and treatments. Has consistent/reliable transportation: Yes, Assessed support system. Has consistent/reliable family or other support: Yes, and Nutrition assessment performed Assessed for SDOH barriers, no acute needs identified Reviewed and discussed with patient MD recommendations for Oncology treatment and determined patient verbalizes understanding;  ASSESSMENT/PLAN: Tonya Rogers, was evaluated today in clinic. She has had multiple in transit lesions resected over a decade. Currently holding encorafenib/binimetinib due to side effects. No acute need for surgical intervention, although further excisions are possible.. Will follow up with Dr. Baltazar Najjar regarding further medical therapy.   Could consider oncolytic virus vs repeat isolated limb infusion for persistent disease after full trial of pembrolizumab.  -RTC for TVEC injections to residual in transit lesions. Reviewed and discussed with patient, TVEC injection schedule; initial TVEC injection scheduled for 8/18, subsequent #2 injection will occur 3 weeks from initial and then every 2 weeks x 13 total injections Determined patient has a good understanding of her prescribed treatment plan, discussed daughter [name] will accompany patient to her appointments       Our next appointment is by telephone on 07/31/22 at 10:00 AM   Please call the care guide team at  [phone] if you need to cancel or reschedule your appointment.   If you are experiencing a Mental Health or [place] or need someone to talk to, please call [phone]-TALK (toll free, 24 hour hotline)  Patient verbalizes understanding of instructions and care plan provided today and agrees to view in [place]. Active MyChart status and patient understanding of how to access instructions and care plan via MyChart confirmed with patient.     Barb Merino, RN, BSN, CCM Care Management Coordinator [facility] Care Management Direct Phone: [phone]

## 2022-07-04 ENCOUNTER — Other Ambulatory Visit: Payer: Self-pay | Admitting: Podiatry

## 2022-07-04 MED ORDER — CICLOPIROX 8 % EX SOLN
Freq: Every day | CUTANEOUS | 0 refills | Status: DC
Start: 2022-07-04 — End: 2023-06-05

## 2022-07-13 DIAGNOSIS — C779 Secondary and unspecified malignant neoplasm of lymph node, unspecified: Secondary | ICD-10-CM | POA: Diagnosis not present

## 2022-07-13 DIAGNOSIS — C4371 Malignant melanoma of right lower limb, including hip: Secondary | ICD-10-CM | POA: Diagnosis not present

## 2022-07-13 DIAGNOSIS — Z5111 Encounter for antineoplastic chemotherapy: Secondary | ICD-10-CM | POA: Diagnosis not present

## 2022-07-26 ENCOUNTER — Other Ambulatory Visit: Payer: Self-pay | Admitting: Family Medicine

## 2022-07-26 DIAGNOSIS — E1149 Type 2 diabetes mellitus with other diabetic neurological complication: Secondary | ICD-10-CM

## 2022-07-31 ENCOUNTER — Ambulatory Visit: Payer: Self-pay

## 2022-07-31 NOTE — Patient Instructions (Signed)
Visit Information  Thank you for taking time to visit with me today. Please don't hesitate to contact me if I can be of assistance to you.   Following are the goals we discussed today:   Goals Addressed     Patient Stated     "To tolerate the new cancer therapy" (pt-stated)        Care Coordination Interventions: Assessment of understanding of oncology diagnosis:  Assessed patient understanding of cancer diagnosis and recommended treatment plan,  Reviewed upcoming provider appointments and treatment appointments, Assessed available transportation to appointments and treatments. Has consistent/reliable transportation: Yes, Assessed support system. Has consistent/reliable family or other support: Yes, and Nutrition assessment performed Determined patient is experiencing lose/watery stools since receiving her 1st Imlygic injection for melanoma Determined daughter [name] notified patient's doctor of symptoms  Educated patient on importance of hydration replacing electrolytes with Pedialyte and or Gatorade zero   Educated on importance of keeping skin clean and dry and or apply Desitin or Vasolin for skin barrier Instructed patient to notify her doctor if symptoms are persistent     Our next appointment is by telephone on 08/28/22 at 09:45 AM  Please call the care guide team at [phone] if you need to cancel or reschedule your appointment.   If you are experiencing a Mental Health or [place] or need someone to talk to, please call 1-800-273-TALK (toll free, 24 hour hotline)  Patient verbalizes understanding of instructions and care plan provided today and agrees to view in [place]. Active MyChart status and patient understanding of how to access instructions and care plan via MyChart confirmed with patient.     Barb Merino, RN, BSN, CCM Care Management Coordinator [facility] Care Management Direct Phone: [phone]

## 2022-07-31 NOTE — Patient Outreach (Signed)
  Care Coordination   Follow Up Visit Note   07/31/2022 Name: Luda Charbonneau MRN: [id number] DOB: 1936/08/29  Stephanye Finnicum is a 86 y.o. year old female who sees Rita Ohara, MD for primary care. I spoke with  [name] by phone today.  What matters to the patients health and wellness today?  Patient is experiencing GI upset secondary to her Cancer treatment.     Goals Addressed     Patient Stated     "To tolerate the new cancer therapy" (pt-stated)        Care Coordination Interventions: Assessment of understanding of oncology diagnosis:  Assessed patient understanding of cancer diagnosis and recommended treatment plan,  Reviewed upcoming provider appointments and treatment appointments, Assessed available transportation to appointments and treatments. Has consistent/reliable transportation: Yes, Assessed support system. Has consistent/reliable family or other support: Yes, and Nutrition assessment performed Determined patient is experiencing lose/watery stools since receiving her 1st Imlygic injection for melanoma Determined daughter [name] notified patient's doctor of symptoms  Educated patient on importance of hydration replacing electrolytes with Pedialyte and or Gatorade zero   Educated on importance of keeping skin clean and dry and or apply Desitin or Vasolin for skin barrier Instructed patient to notify her doctor if symptoms are persistent     SDOH assessments and interventions completed:  Yes     Care Coordination Interventions Activated:  Yes  Care Coordination Interventions:  Yes, provided   Follow up plan: Follow up call scheduled for 08/28/22 '@09'$ :45 AM    Encounter Outcome:  Pt. Visit Completed

## 2022-08-01 ENCOUNTER — Encounter: Payer: Self-pay | Admitting: Internal Medicine

## 2022-08-01 DIAGNOSIS — R946 Abnormal results of thyroid function studies: Secondary | ICD-10-CM | POA: Diagnosis not present

## 2022-08-01 DIAGNOSIS — Z79899 Other long term (current) drug therapy: Secondary | ICD-10-CM | POA: Diagnosis not present

## 2022-08-01 DIAGNOSIS — C779 Secondary and unspecified malignant neoplasm of lymph node, unspecified: Secondary | ICD-10-CM | POA: Diagnosis not present

## 2022-08-01 DIAGNOSIS — R918 Other nonspecific abnormal finding of lung field: Secondary | ICD-10-CM | POA: Diagnosis not present

## 2022-08-01 DIAGNOSIS — R5383 Other fatigue: Secondary | ICD-10-CM | POA: Diagnosis not present

## 2022-08-01 DIAGNOSIS — Z9889 Other specified postprocedural states: Secondary | ICD-10-CM | POA: Diagnosis not present

## 2022-08-01 DIAGNOSIS — B3781 Candidal esophagitis: Secondary | ICD-10-CM | POA: Diagnosis not present

## 2022-08-01 DIAGNOSIS — Z9221 Personal history of antineoplastic chemotherapy: Secondary | ICD-10-CM | POA: Diagnosis not present

## 2022-08-01 DIAGNOSIS — C4371 Malignant melanoma of right lower limb, including hip: Secondary | ICD-10-CM | POA: Diagnosis not present

## 2022-08-01 DIAGNOSIS — E039 Hypothyroidism, unspecified: Secondary | ICD-10-CM | POA: Diagnosis not present

## 2022-08-03 DIAGNOSIS — C4371 Malignant melanoma of right lower limb, including hip: Secondary | ICD-10-CM | POA: Diagnosis not present

## 2022-08-13 ENCOUNTER — Other Ambulatory Visit: Payer: Self-pay | Admitting: Family Medicine

## 2022-08-13 DIAGNOSIS — Z5181 Encounter for therapeutic drug level monitoring: Secondary | ICD-10-CM

## 2022-08-17 DIAGNOSIS — Z5111 Encounter for antineoplastic chemotherapy: Secondary | ICD-10-CM | POA: Diagnosis not present

## 2022-08-17 DIAGNOSIS — E039 Hypothyroidism, unspecified: Secondary | ICD-10-CM | POA: Diagnosis not present

## 2022-08-17 DIAGNOSIS — C779 Secondary and unspecified malignant neoplasm of lymph node, unspecified: Secondary | ICD-10-CM | POA: Diagnosis not present

## 2022-08-17 DIAGNOSIS — C4371 Malignant melanoma of right lower limb, including hip: Secondary | ICD-10-CM | POA: Diagnosis not present

## 2022-08-27 ENCOUNTER — Other Ambulatory Visit: Payer: Self-pay | Admitting: Family Medicine

## 2022-08-27 DIAGNOSIS — E1149 Type 2 diabetes mellitus with other diabetic neurological complication: Secondary | ICD-10-CM

## 2022-08-28 ENCOUNTER — Ambulatory Visit: Payer: Self-pay

## 2022-08-28 NOTE — Telephone Encounter (Signed)
Sent my chart message. [place]

## 2022-08-28 NOTE — Patient Outreach (Signed)
  Care Coordination   08/28/2022 Name: Tonya Rogers MRN: [id number] DOB: Feb 23, 1936   Care Coordination Outreach Attempts:  An unsuccessful telephone outreach was attempted for a scheduled appointment today.  Follow Up Plan:  Additional outreach attempts will be made to offer the patient care coordination information and services.   Encounter Outcome:  Pt. Request to Call Back  Care Coordination Interventions Activated:  No   Care Coordination Interventions:  No, not indicated    Barb Merino, RN, BSN, CCM Care Management Coordinator [place] Management Direct Phone: [phone]

## 2022-08-29 DIAGNOSIS — Z23 Encounter for immunization: Secondary | ICD-10-CM | POA: Diagnosis not present

## 2022-08-30 ENCOUNTER — Other Ambulatory Visit: Payer: Self-pay | Admitting: Family Medicine

## 2022-08-30 DIAGNOSIS — E1149 Type 2 diabetes mellitus with other diabetic neurological complication: Secondary | ICD-10-CM

## 2022-08-31 DIAGNOSIS — C4371 Malignant melanoma of right lower limb, including hip: Secondary | ICD-10-CM | POA: Diagnosis not present

## 2022-08-31 DIAGNOSIS — Z5111 Encounter for antineoplastic chemotherapy: Secondary | ICD-10-CM | POA: Diagnosis not present

## 2022-08-31 DIAGNOSIS — C779 Secondary and unspecified malignant neoplasm of lymph node, unspecified: Secondary | ICD-10-CM | POA: Diagnosis not present

## 2022-09-04 ENCOUNTER — Encounter: Payer: Self-pay | Admitting: Internal Medicine

## 2022-09-05 NOTE — Progress Notes (Deleted)
No chief complaint on file.  Tonya Rogers is a 86 y.o. female who presents for annual wellness visit and follow-up on chronic medical conditions  Melanoma:  She has had numerous unresectable in-transit lesions, and in August was referred by Dr. Clovis Riley for immunotherapy.  She has multiple pigmented lesions on her R shin that continue to change. She had a few different treatments (one not tolerated due to elevated CPK, muscle weakness). She has been getting injection of Imlygic into the RLE q 2 weeks, starting mid-August.   She is under the care of Dr. Baltazar Najjar at [facility] (oncology), and Dr. Clovis Riley (derm).  DM: Doing well on the Bydureon B-cise, pioglitazone and metformin.  Sugars have been controlled, with last A1c of 6.1% in April 2023. Sugars have been running   Denies hypoglycemia, polydipsia or polyuria. Has some neuropathy--tingling in feet (only very slight tingling in hands), a mild burning, not painful or bothersome, unchanged/chronic.  Last eye exam was 06/2021, no retinopathy. She checks her feet regularly, no sores/concerns. She has microalbuminuria.  She is on Losartan.    Hyperlipidemia--She is compliant with her medications, taking '20mg'$  of simvastatin and '3000mg'$  of fish oil. Denies any side effects to the medications, and is following a lowfat, low cholesterol diet.   Lipids were at goal on last check: Lab Results  Component Value Date   CHOL 165 02/26/2022   HDL 83 02/26/2022   LDLCALC 69 02/26/2022   TRIG 69 02/26/2022   CHOLHDL 2.0 02/26/2022    Hypertension follow-up:  She is compliant with losartan '100mg'$ , and denies side effects. She limits the sodium in her diet. Blood pressures elsewhere are running   She does some exercises at home, yardwork. Denies dizziness, headaches, chest pain, DOE, edema.  Hypothyroidism:  She denies any missed pills, taking the branded Synthroid.  She has always been crushing medication, mixing it with water, as she had trouble swallowing the  pill when she tried swallowing it whole.  She has been taking it this way for many years. Last TSH here was 3.120 in 09/2021.   Thyroid was monitored closely by [facility] oncologist, due to the treatments she was getting. TSH had one up.  It has been coming down from 22.08 on 6/7, to the most recent TSH at [facility] of 11.88 in 07/2022 (went from 22.08 to 18.77 to 13.88 to 11.88 from June/July/Sept 2023). We had increased her Synthroid dose slightly in mid-June, having her take 2 of her 8mg tablets on Sundays, and 1 pill daily the other 6 days (had been taking '50mg'$  daily prior to that).  Denies any change in moods, bowels, temperature intolerance. She has had some issues with nail splitting over the years, but Biotin has helped. Dry skin mainly in winter. Constipation is managed well with prunes.   Osteoporosis:  She took fosamax x 5 years, stopped in 26295due to jaw complications (jaw bones were "breaking apart"). She continues to take regular calcium, Vitamin D, and tries to get some weight-bearing exercise. Last bone density test 07/2019. T-2.2 at L femoral neck (decline from -1.9), T-2.2 at R femoral neck.  We discussed these results in detail at prior visit, that since she didn't tolerate alendronate due to jaw problems, Prolia may not be a good choice for her either. We discussed Evista, so she could research and decide if she wants to try this, vs recheck DEXA 07/2021. She wanted to repeat DEXA. Done?? ***  Immunization History  Administered Date(s) Administered   Fluad Quad(high  Dose 65+) 07/20/2019   Influenza Split 08/26/2013, 09/08/2014, 08/17/2015   Influenza, High Dose Seasonal PF 09/12/2017, 08/06/2018, 08/13/2020, 08/18/2021   Influenza-Unspecified 08/20/2016   PFIZER Comirnaty(Gray Top)Covid-19 Tri-Sucrose Vaccine 03/28/2021   PFIZER(Purple Top)SARS-COV-2 Vaccination 01/07/2020, 02/01/2020, 09/13/2020   Pfizer Covid-19 Vaccine Bivalent Booster 27yr & up 10/09/2021   Pneumococcal Conjugate-13  08/26/2014   Pneumococcal Polysaccharide-23 12/27/2000, 07/27/2008, 04/03/2018   Td 03/26/2005, 11/28/2017   Tdap 03/05/2012   Zoster Recombinat (Shingrix) 02/11/2018, 08/06/2018   Zoster, Live 02/25/2011   Last Pap smear: 10/2013, negative, no high risk HPV present Last mammogram: 01/2022 Colon cancer screening: negative Cologard 01/2016 and 02/2020. 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 Last DEXA: 07/2019. T-2.2 at L femoral neck (decline from -1.9), T-2.2 at R femoral neck. Abnormal FRAX score of 22% major osteoporotic fracture, 6% hip fracture. Dentist: every 6 months Ophtho: yearly Exercise:   Walking in her cul-de-sac, yard for 20 minutes 3-4x/week. She uses 2# weights 3-5x/week.  Also works in the yard. (Prior routine: gym 3-4x/week, treadmill, and a 30 min program with 8-10 machines, and bike, rowing machines. She is there 60-90 minutes, at least half is cardio.)   Patient Care Team: KRita Ohara MD as PCP - General (Family Medicine) BOren Binet BCari Caraway DMD (Dentistry) SRutherford Guys MD as Consulting Physician (Ophthalmology) TLeta Baptist MD as Consulting Physician (Otolaryngology) LDruscilla Brownie MD as Referring Physician (Dermatology) Gastroenterology, ENeoma Laming PHorton Marshall(Hematology and Oncology) LSheilah Mins MD as Referring Physician (Surgical Oncology) LRex KrasAClaudette Stapler RN as TMcCurtainManagement Local Dermatologist:  Dr. GPearline Cablesat [place]Office [place] from [place]/2022 in [place] PHQ-2 Total Score 0        Falls screen:     02/28/2022    2:17 PM 10/11/2021   11:32 AM 03/01/2021    3:12 PM 09/01/2020    2:51 PM 10/21/2019    9:58 AM  [place]in the past year? 0 0 0 0 1  Comment     Emmi Telephone Survey: data to providers prior to load  Number falls in past yr: 0 0 0  1  Comment     Emmi Telephone Survey Actual Response = 1   Injury with Fall? 0 0 0  0  Risk for fall due to : No Fall Risks No Fall Risks No Fall Risks    Follow up Falls evaluation completed Falls evaluation completed Falls evaluation completed       Functional Status Survey:   Doesn't find that her hearing aids help.  She plans to see [facility] to evaluate her earing (has excess noise, denies ringing).         End of Life Discussion:  Patient has a living will and medical power of attorney   PMH, PSH, SH and FH reviewed and updated    ROS:  The patient denies anorexia, fever, headaches,  vision changes, ear pain, sore throat, breast concerns, chest pain, palpitations, dizziness, syncope, dyspnea on exertion, cough, swelling, nausea, vomiting, diarrhea, abdominal pain, melena, hematochezia, indigestion/heartburn, hematuria, incontinence (just with cough/sneeze), dysuria,vaginal bleeding, discharge, odor or itch, genital lesions, joint pains, weakness, tremor, suspicious skin lesions, depression, anxiety, abnormal bleeding/bruising (mild, chronic), or enlarged lymph nodes. Constipation, unchanged/chronic--controlled Hearing loss bilaterally. Has hearing aids--not using them; doesn't find them all that helpful. Sees Dr. TBenjamine Molayearly. +tingling/mild burning in feet, chronic, unchanged. Dry mouth. Still using fluoride  trays at night. Having mouth and gum soreness from her immunotherapy. Up twice a night to void, unchanged.  Slight short-term memory issues (names), nothing that has progressed. Increased fatigue since starting immunotherapy. +intentional weight loss   PHYSICAL EXAM:  There were no vitals taken for this visit.  Wt Readings from Last 3 Encounters:  05/09/22 142 lb (64.4 kg)  04/05/22 142 lb 6.4 oz (64.6 kg)  03/29/22 143 lb 9.6 oz (65.1 kg)   Well developed, pleasant female, in good spirits HEENT: conjunctiva and sclera are clear, EOMI. OP clear. Sinuses nontender Neck: no lymphadenopathy, thyromegaly or mass, no  bruit Heart: regular rate and rhythm, murmur at RUSB unchanged Lungs: clear bilaterally Back: no CVA or spinal tenderness Breast exam:  *** Abdomen: soft, nontender, no organomegaly or mass Pelvic: declined Extremities: no edema, normal pulses. A few thickened onycomycotic nails (L>R). Normal monofilament exam. Dry skin on legs. Skin: Alopecia/thinning of hair diffusely on top of head, unchanged. 9 pigmented papules on RLE, linear along the inner R shin Psych: normal mood, affect, hygiene and grooming Neuro: alert and oriented, normal gait, using cane.  DIABETIC FOOT EXAM ***skin exam/RLE   ASSESSMENT/PLAN:   LAST AWV WAS 10/11/2021--THIS IS <1 YEAR.  IS THIS OKAY?? DID SOMEONE CONFIRM IT CAN BE DONE EARLY? She has straight Medicare?  Flu shot, COVID RSV from pharmacy TdaP is due, from pharmacy  Has she had diabetic eye exam?? (Last in chart 06/2021) Did she get DEXA done at [facility]? (Was discussed at her last physical, ?if done and we didn't get or if not done).  A1c Breast exam, can skip pelvic if no complaints  Consider increasing dose from 50 mcg daily and 139mg on Sundays to having her double up on Sundays and Thursday (total dose 450 mcg/week, increasing by 50--less of an increase than changing to 774m daily, which would be 525 mcg/week)   Discussed monthly self breast exams and yearly mammograms; at least 30 minutes of aerobic activity at least 5 days/week and weight-bearing exercise 2x/week; proper sunscreen use reviewed; healthy diet, including goals of calcium and vitamin D intake and alcohol recommendations (less than or equal to 1 drink/day) reviewed; regular seatbelt use; changing batteries in smoke detectors.  Immunization recommendations discussed--continue yearly flu shots.  COVID booster RSV vaccine from pharmacy. TdaP due from pharmacy. Colonoscopy recommendations reviewed, UTD. [facility] due [facility] and low risk.   MOST form  reviewed and updated, Full Code, Full care.

## 2022-09-06 ENCOUNTER — Ambulatory Visit (INDEPENDENT_AMBULATORY_CARE_PROVIDER_SITE_OTHER): Payer: Medicare Other | Admitting: Family Medicine

## 2022-09-06 ENCOUNTER — Encounter: Payer: Self-pay | Admitting: Family Medicine

## 2022-09-06 ENCOUNTER — Ambulatory Visit: Payer: Medicare Other | Admitting: Family Medicine

## 2022-09-06 VITALS — BP 120/61 | Temp 98.7°F | Ht 59.5 in | Wt 130.0 lb

## 2022-09-06 DIAGNOSIS — Z7185 Encounter for immunization safety counseling: Secondary | ICD-10-CM

## 2022-09-06 DIAGNOSIS — E039 Hypothyroidism, unspecified: Secondary | ICD-10-CM

## 2022-09-06 MED ORDER — LEVOTHYROXINE SODIUM 75 MCG PO TABS: ORAL_TABLET | ORAL | 0 refills | Status: DC

## 2022-09-06 NOTE — Progress Notes (Signed)
Start time: 1:43 End time:  2pm   Virtual Visit via Telephone Note  I connected with Tonya Rogers on 09/06/22 by telephone and verified that I am speaking with the correct person using two identifiers. Patient reported inability to do video visit.  Location: Patient: home Provider: office   I discussed the limitations of evaluation and management by telemedicine and the availability of in person appointments. The patient expressed understanding and agreed to proceed.  History of Present Illness:  Chief Complaint  Patient presents with   Hypothyroidism    PHONE CALL appt for thyroid.     Hypothyroidism:  She denies any missed pills, taking the branded Synthroid.  She has always been crushing medication, mixing it with water, as she had trouble swallowing the pill when she tried swallowing it whole.  She has been taking it this way for many years. Last TSH here was 3.120 in 09/2021. Thyroid is being monitored closely by [facility] oncologist, due to the treatments she was getting. TSH had gone up.  It has been coming down from 22.08 on 6/7, to the most recent TSH at [facility] of 11.88 in 07/2022 (went from 22.08 to 18.77 to 13.88 to 11.88 from June/July/Sept 2023). We had increased her Synthroid dose slightly in mid-June, having her take 2 of her 42mg tablets on Sundays, and 1 pill daily the other 6 days (had been taking '50mg'$  daily prior to that).   Denies any change in moods, bowels, temperature intolerance.  Energy is almost back to normal. She has had some issues with nail splitting over the years, but Biotin has helped. Dry skin mainly in winter. Constipation is managed well with prunes.  She has had intentional weight loss. She feels good overall.  She has been getting chemo injections to the [facility] to treat her melanoma. She reports these injections are quite painful.  PMH, PSH, SH reviewed  Outpatient Encounter Medications as of 09/06/2022  Medication Sig Note   BIOTIN PO Take 1 tablet by  mouth daily.    BYDUREON BCISE 2 MG/0.85ML AUIJ INJECT 1 PEN SUBCUTANEOUSLY ONCE A WEEK    Calcium Carbonate-Vitamin D 600-200 MG-UNIT TABS Take 1 tablet by mouth daily.    Cholecalciferol (VITAMIN D) 1000 UNITS capsule Take 1,000 Units by mouth daily.    ciclopirox (PENLAC) 8 % solution Apply topically at bedtime. Apply over nail and surrounding skin. Apply daily over previous coat. After seven (7) days, may remove with alcohol and continue cycle.    Coenzyme Q10 (CO Q 10) 100 MG CAPS Take 1 capsule by mouth daily.    fish oil-omega-3 fatty acids 1000 MG capsule Take 3 g by mouth daily.    glucose blood test strip 1 each by Other route 2 (two) times daily. Test twice daily    levothyroxine (SYNTHROID) 75 MCG tablet Take as directed by MD. Take 1 tablet by mouth daily, 6 days/week; skip Sundays. Take 30 mins before eating    magnesium gluconate (MAGONATE) 500 MG tablet Take 500 mg by mouth daily.    metFORMIN (GLUCOPHAGE) 1000 MG tablet TAKE 1 TABLET BY MOUTH TWICE  DAILY WITH MEALS    Multiple Vitamins-Minerals (EYE VITAMINS PO) Take 1 capsule by mouth daily.    Multiple Vitamins-Minerals (MULTIVITAMIN WITH MINERALS) tablet Take 1 tablet by mouth daily.    NON FORMULARY Take 1 capsule by mouth in the morning and at bedtime. 04/05/2022: Hemp Oil and Turmeric curcumin, black pepper w/bioperine '6000mg'$    pioglitazone (ACTOS) 15 MG tablet  TAKE 1 TABLET BY MOUTH DAILY    simvastatin (ZOCOR) 20 MG tablet Take 1 tablet (20 mg total) by mouth daily.    vitamin C (ASCORBIC ACID) 500 MG tablet Take 500 mg by mouth daily.    [DISCONTINUED] SYNTHROID 50 MCG tablet TAKE 1 TABLET DAILY BEFORE BREAKFAST FOR HYPOTHYROIDISM.  TAKE 2 TABLETS ON SUNDAYS    [DISCONTINUED] binimetinib (MEKTOVI) 15 MG tablet Take 30 mg by mouth 2 (two) times daily. (Patient not taking: Reported on 05/09/2022)    [DISCONTINUED] encorafenib (BRAFTOVI) 75 MG capsule Take 300 mg by mouth daily. (Patient not taking: Reported on 05/09/2022)  04/05/2022: Last dose was 2 days ago   [DISCONTINUED] fluconazole (DIFLUCAN) 200 MG tablet Take 200 mg by mouth daily. (Patient not taking: Reported on 05/09/2022)    [DISCONTINUED] losartan (COZAAR) 100 MG tablet TAKE 1 TABLET BY MOUTH DAILY (Patient not taking: Reported on 03/26/2022)    No facility-administered encounter medications on file as of 09/06/2022.   Taking Synthroid 50mg 6d/week and 109m on Sundays prior to today's visit  No Known Allergies  ROS: no fever, chills, URI symptoms, headaches, dizziness, GI complaints. Energy is improving, moods are good, bowels are controlled.  +intentional weight loss.  No change to hair/skin/nails. Decreased hearing--hears me very well over the telephone today (finds the echoes in the office make it hard to hear in person).   Observations/Objective:  BP 120/61   Temp 98.7 F (37.1 C) (Temporal)   Ht 4' 11.5" (1.511 m)   Wt 130 lb (59 kg)   BMI 25.82 kg/m   Wt Readings from Last 3 Encounters:  09/06/22 130 lb (59 kg)  05/09/22 142 lb (64.4 kg)  04/05/22 142 lb 6.4 oz (64.6 kg)   Patient is alert an oriented. She is in good spirits. Normal speech. Exam is limited due to the virtual nature of the visit.   Assessment and Plan:  Hypothyroidism, unspecified type - elevated TSH. Will slightly increase dose change from 50 mcg 6d/wk and 100 mcg Sundays to taking 75 mcg 6 days/week, skip Sunday. Recheck 6 wks - Plan: levothyroxine (SYNTHROID) 75 MCG tablet  Vaccine counseling - discussed RSV vaccine (to get from pharmacy), COVID booster, and timing (had flu shot lat week)  R/S Medicare wellness visit for 6 weeks out (>1 year from the last), and can recheck TSH at visit. To stop biotin for 1-2 weeks prior to visit  RSV vaccine and COVID booster recommended, timing discussed. Got flu shot last week.   Follow Up Instructions:    I discussed the assessment and treatment plan with the patient. The patient was provided an opportunity to  ask questions and all were answered. The patient agreed with the plan and demonstrated an understanding of the instructions.   The patient was advised to call back or seek an in-person evaluation if the symptoms worsen or if the condition fails to improve as anticipated.  I spent 23 minutes dedicated to the care of this patient, including pre-visit review of records, face to face time, post-visit ordering of testing and documentation.    EvVikki PortsMD

## 2022-09-06 NOTE — Patient Instructions (Addendum)
Today we discussed changing your thyroid dosing. Instead of taking 50 mcg 6 days/week, with 2 tablets on Sunday, I recommend the following:  75 mcg 6 days/week, and SKIP Sunday's dose. This is a slight increase in overall weekly dose. I sent a new prescription for the 30mg dose to [facility]. You can take a 50 plus a 276m tablet (samples) together on Monday through Saturday until the new dose comes.  We should recheck the TSH in 6 weeks.  Stop taking Biotin for 1-2 weeks prior to that blood test.  I recommend getting the RSV vaccine--you will need to get this from the pharmacy (covered by Medicare Part D).  I also recommend getting the COVID booster.  You can get this at our office or from the pharmacy.  These vaccines should be separated from each other by 2 weeks (and from the flu shot that you had last week).  Someone should be contacting you to reschedule your Medicare Annual Wellness Visit.  Glad to hear you're doing well.  I hope your son-in-law recovers quickly.

## 2022-09-11 ENCOUNTER — Ambulatory Visit: Payer: Self-pay

## 2022-09-11 NOTE — Patient Instructions (Signed)
Visit Information  Thank you for taking time to visit with me today. Please don't hesitate to contact me if I can be of assistance to you.   Following are the goals we discussed today:   Goals Addressed               This Visit's Progress     Patient Stated     "To tolerate the new cancer therapy" (pt-stated)        Care Coordination Interventions: Assessment of understanding of oncology diagnosis:  Assessed patient understanding of cancer diagnosis and recommended treatment plan,  Reviewed upcoming provider appointments and treatment appointments, Assessed available transportation to appointments and treatments. Has consistent/reliable transportation: Yes, Assessed support system. Has consistent/reliable family or other support: Yes, and Nutrition assessment performed Determined patient to have lose/watery stools as a side effect to Imlygic injections used for her Melanoma cancer treatments  Reinforced the importance of hydration replacing electrolytes with Pedialyte and or Gatorade zero   Educated patient regarding resources to assist with medical transportation if needed in the future         Our next appointment is by telephone on 10/09/22 at 11:00 AM  Please call the care guide team at [phone] if you need to cancel or reschedule your appointment.   If you are experiencing a Mental Health or [place] or need someone to talk to, please call 1-800-273-TALK (toll free, 24 hour hotline) go to [facility] [address] [place] [phone])  Patient verbalizes understanding of instructions and care plan provided today and agrees to view in [place]. Active MyChart status and patient understanding of how to access instructions and care plan via MyChart confirmed with patient.     Barb Merino, RN, BSN, CCM Care Management Coordinator [place] Management Direct Phone: [phone]

## 2022-09-11 NOTE — Patient Outreach (Signed)
  Care Coordination   Follow Up Visit Note   09/11/2022 Name: Tonya Rogers MRN: [id number] DOB: 08-27-1936  Tonya Rogers is a 86 y.o. year old female who sees Rita Ohara, MD for primary care. I spoke with  Tonya Rogers by phone today.  What matters to the patients health and wellness today?  Patient continues to f/u with the [facility] for treatment of Melanoma.     Goals Addressed               This Visit's Progress     Patient Stated     "To tolerate the new cancer therapy" (pt-stated)        Care Coordination Interventions: Assessment of understanding of oncology diagnosis:  Assessed patient understanding of cancer diagnosis and recommended treatment plan,  Reviewed upcoming provider appointments and treatment appointments, Assessed available transportation to appointments and treatments. Has consistent/reliable transportation: Yes, Assessed support system. Has consistent/reliable family or other support: Yes, and Nutrition assessment performed Determined patient to have lose/watery stools as a side effect to Imlygic injections used for her Melanoma cancer treatments  Reinforced the importance of hydration replacing electrolytes with Pedialyte and or Gatorade zero   Educated patient regarding resources to assist with medical transportation if needed in the future         SDOH assessments and interventions completed:  No     Care Coordination Interventions Activated:  Yes  Care Coordination Interventions:  Yes, provided   Follow up plan: Follow up call scheduled for 10/09/22 '@11'$ :00 AM    Encounter Outcome:  Pt. Visit Completed

## 2022-09-12 DIAGNOSIS — Z23 Encounter for immunization: Secondary | ICD-10-CM | POA: Diagnosis not present

## 2022-09-14 DIAGNOSIS — Z5111 Encounter for antineoplastic chemotherapy: Secondary | ICD-10-CM | POA: Diagnosis not present

## 2022-09-14 DIAGNOSIS — C4371 Malignant melanoma of right lower limb, including hip: Secondary | ICD-10-CM | POA: Diagnosis not present

## 2022-09-14 DIAGNOSIS — C779 Secondary and unspecified malignant neoplasm of lymph node, unspecified: Secondary | ICD-10-CM | POA: Diagnosis not present

## 2022-09-14 DIAGNOSIS — Z5112 Encounter for antineoplastic immunotherapy: Secondary | ICD-10-CM | POA: Diagnosis not present

## 2022-09-28 DIAGNOSIS — C779 Secondary and unspecified malignant neoplasm of lymph node, unspecified: Secondary | ICD-10-CM | POA: Diagnosis not present

## 2022-09-28 DIAGNOSIS — C439 Malignant melanoma of skin, unspecified: Secondary | ICD-10-CM | POA: Diagnosis not present

## 2022-09-28 DIAGNOSIS — C4371 Malignant melanoma of right lower limb, including hip: Secondary | ICD-10-CM | POA: Diagnosis not present

## 2022-09-28 DIAGNOSIS — Z5112 Encounter for antineoplastic immunotherapy: Secondary | ICD-10-CM | POA: Diagnosis not present

## 2022-10-04 ENCOUNTER — Other Ambulatory Visit: Payer: Self-pay | Admitting: Family Medicine

## 2022-10-04 ENCOUNTER — Telehealth: Payer: Self-pay | Admitting: *Deleted

## 2022-10-04 DIAGNOSIS — E1149 Type 2 diabetes mellitus with other diabetic neurological complication: Secondary | ICD-10-CM

## 2022-10-04 NOTE — Telephone Encounter (Signed)
error 

## 2022-10-09 ENCOUNTER — Ambulatory Visit: Payer: Self-pay

## 2022-10-09 DIAGNOSIS — E1169 Type 2 diabetes mellitus with other specified complication: Secondary | ICD-10-CM

## 2022-10-09 NOTE — Patient Instructions (Addendum)
Visit Information  Thank you for taking time to visit with me today. Please don't hesitate to contact me if I can be of assistance to you.   Following are the goals we discussed today:   Goals Addressed               This Visit's Progress     Patient Stated     "To tolerate the new cancer therapy" (pt-stated)        Care Coordination Interventions: Assessment of understanding of oncology diagnosis:  Assessed patient understanding of cancer diagnosis and recommended treatment plan,  Reviewed upcoming provider appointments and treatment appointments, Assessed available transportation to appointments and treatments. Has consistent/reliable transportation: Yes, Assessed support system. Has consistent/reliable family or other support: Yes, and Nutrition assessment performed Determined patient to have lose/watery stools as a side effect to Imlygic injections used for her Melanoma cancer treatments  Reinforced the importance of hydration replacing electrolytes with Pedialyte and or Gatorade zero   Educated patient regarding resources to assist with medical transportation if needed in the future       I am cutting back on my carbs (pt-stated)        Care Coordination Interventions: Provided education to patient about basic DM disease process Reviewed medications with patient and discussed importance of medication adherence Advised patient, providing education and rationale, to check cbg daily before meals and record, calling PCP for findings outside established parameters Review of patient status, including review of consultants reports, relevant laboratory and other test results, and medications completed           Our next appointment is by telephone on 11/06/22 at 10:30 AM  Please call the care guide team at [phone] if you need to cancel or reschedule your appointment.   If you are experiencing a Mental Health or [place] or need someone to talk to, please  call 1-800-273-TALK (toll free, 24 hour hotline)  Patient verbalizes understanding of instructions and care plan provided today and agrees to view in [place]. Active MyChart status and patient understanding of how to access instructions and care plan via MyChart confirmed with patient.     Barb Merino, RN, BSN, CCM Care Management Coordinator [facility] Care Management  Direct Phone: [phone]

## 2022-10-09 NOTE — Patient Outreach (Signed)
  Care Coordination   Follow Up Visit Note   10/09/2022 Name: Tonya Rogers MRN: [id number] DOB: [birth year]  Tonya Rogers is a 86 y.o. year old female who sees Tonya Ohara, MD for primary care. I spoke with  Tonya Rogers by phone today.  What matters to the patients health and wellness today?  Patient continues to receive cancer treatments for her Melanoma skin lesions. Patient is monitoring her blood sugars and adhering to a diabetic diet.     Goals Addressed               This Visit's Progress     Patient Stated     "To tolerate the new cancer therapy" (pt-stated)        Care Coordination Interventions: Assessment of understanding of oncology diagnosis:  Assessed patient understanding of cancer diagnosis and recommended treatment plan,  Reviewed upcoming provider appointments and treatment appointments, Assessed available transportation to appointments and treatments. Has consistent/reliable transportation: Yes, Assessed support system. Has consistent/reliable family or other support: Yes, and Nutrition assessment performed Determined patient to have lose/watery stools as a side effect to Imlygic injections used for her Melanoma cancer treatments  Reinforced the importance of hydration replacing electrolytes with Pedialyte and or Gatorade zero   Educated patient regarding resources to assist with medical transportation if needed in the future       I am cutting back on my carbs (pt-stated)        Care Coordination Interventions: Provided education to patient about basic DM disease process Reviewed medications with patient and discussed importance of medication adherence Advised patient, providing education and rationale, to check cbg daily before meals and record, calling PCP for findings outside established parameters Review of patient status, including review of consultants reports, relevant laboratory and other test results, and medications completed            SDOH assessments and interventions completed:  Yes  SDOH Interventions Today    Flowsheet Row Most Recent Value  SDOH Interventions   Transportation Interventions Other (Comment)  [patient needs medical transportation one day in January when her daughter is not available to drive her]        Care Coordination Interventions Activated:  Yes  Care Coordination Interventions:  Yes, provided   Follow up plan: Referral made to [place] to assist with medical transportation Follow up call scheduled for 11/06/22 '@1030'$  AM    Encounter Outcome:  Pt. Visit Completed

## 2022-10-10 ENCOUNTER — Other Ambulatory Visit: Payer: Self-pay | Admitting: *Deleted

## 2022-10-10 ENCOUNTER — Telehealth: Payer: Self-pay | Admitting: *Deleted

## 2022-10-10 DIAGNOSIS — E118 Type 2 diabetes mellitus with unspecified complications: Secondary | ICD-10-CM

## 2022-10-10 DIAGNOSIS — E039 Hypothyroidism, unspecified: Secondary | ICD-10-CM

## 2022-10-10 NOTE — Telephone Encounter (Signed)
Needs A1c (last was in April, can be sooner than December). Needs flu, COVID, RSV and TdaP (last 2 from pharmacy) Will need another TSH, unsure when they plan to check. We can do in December if they haven't rechecked it by then. Otherwise, fine to do it virtually to minimize time in the office

## 2022-10-10 NOTE — Telephone Encounter (Signed)
Spoke with patient and she will come for A1c and TSH on 10/22/22, lab visit scheduled and future orders placed. She had flu and covid shot, abstracted. She will get RSV and TDap at pharmacy when she can. And appt on 10/29/22 changed to virtual-I will check with Melissa and see if telephone is ok.

## 2022-10-10 NOTE — Telephone Encounter (Signed)
Patient called and asked if we could do her AWV 12/4 as a telephone call, she cannot get to the office due to her skin shots.

## 2022-10-11 ENCOUNTER — Telehealth: Payer: Self-pay

## 2022-10-11 NOTE — Telephone Encounter (Signed)
   Telephone encounter was:  Successful.  10/11/2022 Name: Tonya Rogers MRN: [id number] DOB: 1936/05/02  Tonya Rogers is a 86 y.o. year old female who is a primary care patient of Rita Ohara, MD . The community resource team was consulted for assistance with Transportation Needs   Care guide performed the following interventions: Spoke with patient about transportation for 11/27/22 appointment at [facility]. Patient stated that she has a friend that can take her and stay with her during the appointment.  I told the patient that if something changes please call the office and they will send a referral. I did not give my personal information because if I am out of the office someone will get the referral and assist her.  Follow Up Plan:  No further follow up planned at this time. The patient has been provided with needed resources.  [place] Resource Care Guide   [email or web address]  ?? [phone]   Website: [email or web address]  [email or web address]

## 2022-10-15 DIAGNOSIS — C779 Secondary and unspecified malignant neoplasm of lymph node, unspecified: Secondary | ICD-10-CM | POA: Diagnosis not present

## 2022-10-15 DIAGNOSIS — C4371 Malignant melanoma of right lower limb, including hip: Secondary | ICD-10-CM | POA: Diagnosis not present

## 2022-10-15 DIAGNOSIS — C439 Malignant melanoma of skin, unspecified: Secondary | ICD-10-CM | POA: Diagnosis not present

## 2022-10-16 ENCOUNTER — Ambulatory Visit: Payer: Medicare Other | Admitting: Podiatry

## 2022-10-22 ENCOUNTER — Other Ambulatory Visit: Payer: Medicare Other

## 2022-10-22 DIAGNOSIS — E118 Type 2 diabetes mellitus with unspecified complications: Secondary | ICD-10-CM

## 2022-10-22 DIAGNOSIS — E039 Hypothyroidism, unspecified: Secondary | ICD-10-CM

## 2022-10-23 LAB — TSH: TSH: 5.32 u[IU]/mL — ABNORMAL HIGH (ref 0.450–4.500)

## 2022-10-23 LAB — HEMOGLOBIN A1C
Est. average glucose Bld gHb Est-mCnc: 105 mg/dL
Hgb A1c MFr Bld: 5.3 % (ref 4.8–5.6)

## 2022-10-26 DIAGNOSIS — C779 Secondary and unspecified malignant neoplasm of lymph node, unspecified: Secondary | ICD-10-CM | POA: Diagnosis not present

## 2022-10-26 DIAGNOSIS — C439 Malignant melanoma of skin, unspecified: Secondary | ICD-10-CM | POA: Diagnosis not present

## 2022-10-26 DIAGNOSIS — Z5112 Encounter for antineoplastic immunotherapy: Secondary | ICD-10-CM | POA: Diagnosis not present

## 2022-10-26 DIAGNOSIS — C4371 Malignant melanoma of right lower limb, including hip: Secondary | ICD-10-CM | POA: Diagnosis not present

## 2022-10-28 NOTE — Progress Notes (Signed)
Start time: 1:42 End time: 2:23  Virtual Visit via Telephone Note  I connected with Tonya Rogers on 10/29/22 by telephone (unable to use any video platform), and verified that I am speaking with the correct person using two identifiers.  Location: Patient: home Provider: office   I discussed the limitations of evaluation and management by telemedicine and the availability of in person appointments. The patient expressed understanding and agreed to proceed.  History of Present Illness:  Chief Complaint  Patient presents with   Medicare Wellness    TELEPHONE AWV visit.  Never restarted losartan. Needs Bydureon refill to [facility], asking for 6 months. She did not have diabetic eye exam this year. She thinks she has had DEXA at [facility] this year, I will get. No new concerns. Did 6CIT.   Tonya Rogers is a 86 y.o. female who presents for annual wellness visit and follow-up on chronic medical conditions See below for labs done prior to her visit.  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 She continues regular care at [facility]. She has had 7 treatments so far with [facility]), last on 12/1. She reports they have gone down from 13 places to inject, down to just 5. Injections are very painful, but overall is tolerating treatments. She has had cold hands and feet, and some diarrhea. Hands and feet were warm after blood transfusion, but reports they are back to being cold now.  She doesn't have recurrent anemia, last Hgb was 14.5 last week. She is scheduled for PET scan on Friday.  Hypothyroidism:  In October we changed her thyroid dose--switching from 50 mcg 6 days/week and 100 mcg on Sundays, we switched to 75 mcg 6 days/week, and SKIPPING Sunday's dose. She was reminded to stop Biotin for 1-2 weeks prior to thyroid testing, though isn't clear whether or not she actually stopped it.  She denies any significant thyroid symptoms--previously had constipation, but now having  diarrhea related to the chemo.  No significant changes to hair (chronic alopecia).  She previously noted brittle nails and peeling of skin, felt to be related to her chemo. Nails are better. Skin is more dry and flaky, relates to the chemo, not her thyroid. Moods have remained good.  Decreased appetite.  +weight loss (see below, some intentional, some not intentional).  Lower GI bleed in April. AVM treated during colonoscopy. There were multiple erosions of the colon, felt to be related to use of NSAIDs (which she has stopped using, no longer takes Aleve, just Tylenol). Normal stomach/EGD.  Candida esophagitis was noted, and was treated with fluconazole.  She denies any recurrent melena, denies abdominal pain, hematochezia, chest pain, dysphagia.  DM: Doing well on the Bydureon B-cise, pioglitazone and metformin.   Sugars are not checked often. Max was 118, low of 81. Denies hypoglycemia, polydipsia or polyuria. 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 Not painful or burning. Last eye exam was 06/2021, no retinopathy. She is waiting until chemo is finished (February) to schedule her eye exam. She checks her feet regularly, no sores/concerns. She has microalbuminuria (ratio elevated at 154 in 02/2022).  She previously took Losartan, stopped after GI bleed in May (when BP's low).   Hyperlipidemia--She is compliant with her medications, taking 20mg  of simvastatin and 3000mg  of fish oil. Denies any side effects to the medications, and is following a lowfat, low cholesterol diet.  Lipids were at goal on last check: Lab Results  Component Value  Date   CHOL 165 02/26/2022   HDL 83 02/26/2022   LDLCALC 69 02/26/2022   TRIG 69 02/26/2022   CHOLHDL 2.0 02/26/2022   Hypertension follow-up:  She previously took losartan 100mg . This was stopped after hospitalization for GI bleed, when BP's were low.  She reports checking BP every 3-4 days. Today she  reports ranging 120-139/60's-70's. She limits the sodium in her diet. Denies dizziness, headaches, chest pain, DOE, edema. BP Readings from Last 3 Encounters:  10/29/22 139/72  09/06/22 120/61  05/09/22 120/60   Osteoporosis:  She took fosamax x 5 years, stopped in 2011 due to jaw complications (jaw bones were "breaking apart"). She continues to take regular calcium, Vitamin D. Hasn't been getting any weight-bearing exercise. Last bone density test 07/2019. T-2.2 at L femoral neck (decline from -1.9), T-2.2 at R femoral neck.  The plan was to recheck DEXA 07/2021.  This hasn't been done yet.  Immunization History  Administered Date(s) Administered   COVID-19, mRNA, vaccine(Comirnaty)12 years and older 09/12/2022   Fluad Quad(high Dose 65+) 07/20/2019, 08/29/2022   Influenza Split 08/26/2013, 09/08/2014, 08/17/2015   Influenza, High Dose Seasonal PF 09/12/2017, 08/06/2018, 08/13/2020, 08/18/2021   Influenza-Unspecified 08/20/2016   PFIZER Comirnaty(Gray Top)Covid-19 Tri-Sucrose Vaccine 03/28/2021   PFIZER(Purple Top)SARS-COV-2 Vaccination 01/07/2020, 02/01/2020, 09/13/2020   Pfizer Covid-19 Vaccine Bivalent Booster 43yrs & up 10/09/2021   Pneumococcal Conjugate-13 08/26/2014   Pneumococcal Polysaccharide-23 12/27/2000, 07/27/2008, 04/03/2018   Td 03/26/2005, 11/28/2017   Tdap 03/05/2012   Zoster Recombinat (Shingrix) 02/11/2018, 08/06/2018   Zoster, Live 02/25/2011   Last Pap smear: 10/2013, negative, no high risk HPV present Last mammogram: 01/2022 Last colonoscopy: 02/2022 when she had GI bleed--AVM, erosions (negative Cologard 01/2016 and 02/2020) Last DEXA: 07/2019. T-2.2 at L femoral neck (decline from -1.9), T-2.2 at R femoral neck. Abnormal FRAX score of 22% major osteoporotic fracture, 6% hip fracture. Dentist: every 6 months Ophtho: yearly (past due, plans to wait until after chemo). Exercise: Walks in her house, and in the parking lot.  15-30 minutes 4-5 days/week. Weights for  hands and feet, every other day.   Patient Care Team: Joselyn Arrow, MD as PCP - General (Family Medicine) Cyndee Brightly, Verdene Lennert, DMD (Dentistry) Jethro Bolus, MD as Consulting Physician (Ophthalmology) Newman Pies, MD as Consulting Physician (Otolaryngology) Cherlyn Roberts, MD as Referring Physician (Dermatology) Gastroenterology, Lenise Herald, Ivar Drape (Hematology and Oncology) Tracie Harrier, MD as Referring Physician (Surgical Oncology) Riley Churches, RN as [facility] Care Management Local Dermatologist:  Dr. Wallace Cullens at [facility]: Dr. Jola Babinski at [facility] did last EGD/colonoscopy during 02/2022 hospitalization  Depression Screening: Flowsheet Row Office Visit from 10/29/2022 in [facility]  PHQ-2 Total Score 0        Falls screen:     10/29/2022    1:18 PM 02/28/2022    2:17 PM 10/11/2021   11:32 AM 03/01/2021    3:12 PM 09/01/2020    2:51 PM  Fall Risk   Falls in the past year? 0 0 0 0 0  Number falls in past yr: 0 0 0 0   Injury with Fall? 0 0 0 0   Risk for fall due to : No Fall Risks No Fall Risks No Fall Risks No Fall Risks   Follow up Falls evaluation completed Falls evaluation completed Falls evaluation completed Falls evaluation completed      Functional Status Survey: Is the patient deaf or have difficulty hearing?: Yes (she said her hearing is terrible) Does the  patient have difficulty seeing, even when wearing glasses/contacts?: No (only uses glasses for reading) Does the patient have difficulty concentrating, remembering, or making decisions?: Yes (some memory issues) Does the patient have difficulty walking or climbing stairs?: Yes (goes slowly) Does the patient have difficulty dressing or bathing?: No Does the patient have difficulty doing errands alone such as visiting a doctor's office or shopping?: No     Not using hearing aids--not helpful on the left, needs to be "retuned" on the right.  6CIT screen was done--missed 1/5  components in the address, and got the rest correct. (Unable to do clock drawing for mini-cog over phone)    May 2023-- new MOST form and DNR form completed.  Copies of living will and healthcare power of attorney are scanned into chart.  PMH, PSH, SH and FH reviewed and updated  Outpatient Encounter Medications as of 10/29/2022  Medication Sig Note   BIOTIN PO Take 1 tablet by mouth daily.    BYDUREON BCISE 2 MG/0.85ML AUIJ INJECT 1 PEN SUBCUTANEOUSLY ONCE A WEEK    Calcium Carbonate-Vitamin D 600-200 MG-UNIT TABS Take 1 tablet by mouth daily.    Cholecalciferol (VITAMIN D) 1000 UNITS capsule Take 1,000 Units by mouth daily.    Coenzyme Q10 (CO Q 10) 100 MG CAPS Take 1 capsule by mouth daily.    cyanocobalamin (VITAMIN B12) 1000 MCG tablet Take 1,000 mcg by mouth daily.    fish oil-omega-3 fatty acids 1000 MG capsule Take 3 g by mouth daily.    glucose blood test strip 1 each by Other route 2 (two) times daily. Test twice daily    levothyroxine (SYNTHROID) 75 MCG tablet Take as directed by MD. Take 1 tablet by mouth daily, 6 days/week; skip Sundays. Take 30 mins before eating    magnesium gluconate (MAGONATE) 500 MG tablet Take 500 mg by mouth daily.    metFORMIN (GLUCOPHAGE) 1000 MG tablet TAKE 1 TABLET BY MOUTH TWICE  DAILY WITH MEALS    Multiple Vitamins-Minerals (EYE VITAMINS PO) Take 1 capsule by mouth daily.    Multiple Vitamins-Minerals (MULTIVITAMIN WITH MINERALS) tablet Take 1 tablet by mouth daily.    pioglitazone (ACTOS) 15 MG tablet TAKE 1 TABLET BY MOUTH DAILY    simvastatin (ZOCOR) 20 MG tablet Take 1 tablet (20 mg total) by mouth daily.    vitamin C (ASCORBIC ACID) 500 MG tablet Take 500 mg by mouth daily.    ciclopirox (PENLAC) 8 % solution Apply topically at bedtime. Apply over nail and surrounding skin. Apply daily over previous coat. After seven (7) days, may remove with alcohol and continue cycle. (Patient not taking: Reported on 10/29/2022) 10/29/2022: finished   NON  FORMULARY Take 1 capsule by mouth in the morning and at bedtime. (Patient not taking: Reported on 10/29/2022) 04/05/2022: Hemp Oil and Turmeric curcumin, black pepper w/bioperine 6000mg    No facility-administered encounter medications on file as of 10/29/2022.   No Known Allergies  ROS:  The patient denies anorexia, fever, headaches, vision changes, ear pain, sore throat, breast concerns, chest pain, palpitations, dizziness, syncope, dyspnea on exertion, cough, swelling, nausea, vomiting, abdominal pain, melena, hematochezia, indigestion/heartburn, hematuria, incontinence (just with cough/sneeze, and some with urge), dysuria,vaginal bleeding, discharge, odor or itch, genital lesions, joint pains, weakness, tremor, suspicious skin lesions, depression, anxiety, abnormal bleeding/bruising (mild, chronic), or enlarged lymph nodes. No longer having constipation, has more diarrhea now, related to the chemo. Hearing loss bilaterally. Has hearing aids--not using them; doesn't find them all that helpful. Sees  Dr. Suszanne Conners yearly. +tingling in both hands, L>R, no longer in her feet. Dry mouth and dry skin Up 2-3x/night to void.  Slight short-term memory issues (names), nothing that has progressed.     Observations/Objective:  BP 139/72   Pulse 74   Ht 4' 11.5" (1.511 m)   Wt 124 lb (56.2 kg)   BMI 24.63 kg/m   Wt Readings from Last 3 Encounters:  10/29/22 124 lb (56.2 kg)  09/06/22 130 lb (59 kg)  05/09/22 142 lb (64.4 kg)   Patient has some trouble hearing, but spoke loudly and repeated myself when needed. She is alert and oriented, and in good spirits. She is in no distress. Normal speech.  Exam is limited due to the virtual nature of this visit.  Labs done prior to visit:  Lab Results  Component Value Date   HGBA1C 5.3 10/22/2022  (A1c was 6.1% in 02/2022)  Lab Results  Component Value Date   TSH 5.320 (H) 10/22/2022   Labs from [facility] 12/1:  Sodium 135 - 146 MMOL/L 129 Low   Potassium  3.5 - 5.3 MMOL/L 3.6  Comment: NO VISIBLE HEMOLYSIS  Chloride 98 - 110 MMOL/L 91 Low   CO2 21 - 31 MMOL/L 29  BUN 8 - 24 MG/DL 10  Glucose 70 - 99 MG/DL 956 High   Comment: Patients taking eltrombopag at doses >/= 100 mg daily may show falsely elevated values of 10% or greater.  Creatinine 0.60 - 1.20 MG/DL 2.13  Calcium 8.5 - 08.6 MG/DL 9.8  Total Protein 6.4 - 8.9 G/DL 7.4  Comment: Patients taking eltrombopag at doses >/= 100 mg daily may show falsely elevated values of 10% or greater.  Albumin 3.5 - 5.7 G/DL 4.2  Total Bilirubin 0.0 - 1.0 MG/DL 0.5  Comment: Patients taking eltrombopag at doses >/= 100 mg daily may show falsely elevated values of 10% or greater.  Alkaline Phosphatase 34 - 104 IU/L or U/L 53  AST (SGOT) 13 - 39 IU/L or U/L 20  ALT (SGPT) 7 - 52 IU/L or U/L 11  Anion Gap 4 - 14 MMOL/L 9  Est. GFR >=60 ML/MIN/1.73 M*2 85   WBC 4.4 - 11.0 x 10*3/uL 7.7  RBC 4.10 - 5.10 x 10*6/uL 4.62  Hemoglobin 12.3 - 15.3 G/DL 57.8  Hematocrit 46.9 - 44.6 % 41.4  MCV 80.0 - 96.0 FL 89.7  MCH 27.5 - 33.2 PG 30.8  MCHC 33.0 - 37.0 G/DL 62.9  RDW 52.8 - 41.3 % 15.4  Platelets 150 - 450 X 10*3/uL 422  MPV 6.8 - 10.2 FL 6.8    Assessment and Plan:  Medicare annual wellness visit, subsequent  Hypothyroidism, unspecified type - TSH improved on 6d/week. Slightly elevated (but diarrhea, losing wt, and no sx of hypothyroidism)--cont current dose.  Controlled type 2 diabetes mellitus with complication, without long-term current use of insulin (HCC) - A1c normal, lots of weight loss. Disc stopping pioglitazone vs lowering metformin. Prefers latter (due to diarrhea from chemo). 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  PET scan scheduled for this week  Melanoma of skin (HCC) - under care of onc and derm  Hyponatremia - noted on [facility] labs.  ?etiology (related to chemo,  diarrhea)  Hyperlipidemia associated with type 2 diabetes mellitus (HCC) - cont statin  Osteopenia with high risk of fracture - past due for recheck of DEXA. Encouraged to schedule for March when has  mammo (and when completed with chemo). Ca, D, wt-bearing exercise  Aortic atherosclerosis (HCC) - cont statin  Microalbuminuria - BP's occasionall above goal, no longer low, and with microalbuminuria, will restart losartan at lower dose. Cont to monitor BP - Plan: losartan (COZAAR) 25 MG tablet  Neuropathy due to chemotherapeutic drug (HCC) - no longer has DM neuropathy in feet, but has neuropathy in hands due to chemo.  Tolerable, just tingling  Diarrhea, unspecified type - pt believes is related to chemo. On Mg--trial qod to see if better; check Mg with next labs. Cut back on Metformin since A1c great  Mg--low during hospitalizaition 02/2022. Not checked since, has been taking daily supplement. ?if contributing to diarrhea. Will try cutting back to every other day Mg.  DM--discussed stopping actos vs decreasing metformin. Prefers to cut metformin to 1/2 tablet twice daily. Cont pioglitazone Past due for DM eye exam, wants to wait until chemo done. Schedule for Feb/March.   Discussed monthly self breast exams and yearly mammograms (up to her if she wants to continue vs stop screening mammograms, discussed); at least 30 minutes of aerobic activity at least 5 days/week and weight-bearing exercise 2x/week; proper sunscreen use reviewed; healthy diet, including goals of calcium and vitamin D intake and alcohol recommendations (less than or equal to 1 drink/day) reviewed; regular seatbelt use; changing batteries in smoke detectors.  Immunization recommendations discussed--continue yearly flu shots. RSV vaccine recommended, to get from pharmacy.  Prevnar-20 at her next in-office visit. Colonoscopy recommendations reviewed, UTD.  Consider repeat if anemia issues.  DEXA due--to contact [facility] to schedule  when next mammo is due (after chemo ended). Pap smears not needed due to age and low risk.  MOST form reviewed from 03/2022, DNR, limited interventions (no ICU), IV fluids and TF for a defined trial period.  F/u 3 month med check (in office) with an A1c at the visit, Mg, diabetic foot exam. Prevnar-20 at that visit.  After Visit Summary was mailed to patient (only daughter checks her MyChart).  Medicare Attestation I have personally reviewed: The patient's medical and social history Their use of alcohol, tobacco or illicit drugs Their current medications and supplements The patient's functional ability including ADLs,fall risks, home safety risks, cognitive, and hearing and visual impairment Diet and physical activities Evidence for depression or mood disorders  The patient's weight, height, BMI have been recorded in the chart.  I have made referrals, counseling, and provided education to the patient based on review of the above and I have provided the patient with a written personalized care plan for preventive services.     Follow Up Instructions:    I discussed the assessment and treatment plan with the patient. The patient was provided an opportunity to ask questions and all were answered. The patient agreed with the plan and demonstrated an understanding of the instructions.   The patient was advised to call back or seek an in-person evaluation if the symptoms worsen or if the condition fails to improve as anticipated.  I spent 55 minutes dedicated to the care of this patient, including pre-visit review of records, face to face time, post-visit ordering of testing and documentation.    Lavonda Jumbo, MD

## 2022-10-28 NOTE — Patient Instructions (Incomplete)
HEALTH MAINTENANCE RECOMMENDATIONS:  It is recommended that you get at least 30 minutes of aerobic exercise at least 5 days/week (for weight loss, you may need as much as 60-90 minutes). This can be any activity that gets your heart rate up. This can be divided in 10-15 minute intervals if needed, but try and build up your endurance at least once a week.  Weight bearing exercise is also recommended twice weekly.  Eat a healthy diet with lots of vegetables, fruits and fiber.  "Colorful" foods have a lot of vitamins (ie green vegetables, tomatoes, red peppers, etc).  Limit sweet tea, regular sodas and alcoholic beverages, all of which has a lot of calories and sugar.  Up to 1 alcoholic drink daily may be beneficial for women (unless trying to lose weight, watch sugars).  Drink a lot of water.  Calcium recommendations are 1200-1500 mg daily (1500 mg for postmenopausal women or women without ovaries), and vitamin D 1000 IU daily.  This should be obtained from diet and/or supplements (vitamins), and calcium should not be taken all at once, but in divided doses.  Monthly self breast exams and yearly mammograms for women over the age of 39 is recommended.  Sunscreen of at least SPF 30 should be used on all sun-exposed parts of the skin when outside between the hours of 10 am and 4 pm (not just when at beach or pool, but even with exercise, golf, tennis, and yard work!)  Use a sunscreen that says "broad spectrum" so it covers both UVA and UVB rays, and make sure to reapply every 1-2 hours.  Remember to change the batteries in your smoke detectors when changing your clock times in the spring and fall. Carbon monoxide detectors are recommended for your home.  Use your seat belt every time you are in a car, and please drive safely and not be distracted with cell phones and texting while driving.   Tonya Rogers , Thank you for taking time to come for your Medicare Wellness Visit. I appreciate your ongoing  commitment to your health goals. Please review the following plan we discussed and let me know if I can assist you in the future.   This is a list of the screening recommended for you and due dates:  Health Maintenance  Topic Date Due   Eye exam for diabetics  07/25/2022   COVID-19 Vaccine (6 - 2023-24 season) 07/27/2022   Medicare Annual Wellness Visit  10/11/2022   Complete foot exam   10/11/2022   Yearly kidney health urinalysis for diabetes  02/27/2023   Yearly kidney function blood test for diabetes  04/06/2023   Hemoglobin A1C  04/22/2023   DTaP/Tdap/Td vaccine (4 - Td or Tdap) 11/29/2027   Pneumonia Vaccine  Completed   Flu Shot  Completed   DEXA scan (bone density measurement)  Completed   Zoster (Shingles) Vaccine  Completed   HPV Vaccine  Aged Out   RSV vaccine is recommended.  Please get this from the pharmacy.  We discussed the recommendation for a bone density scan (your last was in 2020). If it shows progression to osteoporosis, we should discuss treatment options, to prevent fractures.  I obviously could not perform a diabetic foot exam at a virtual visit. If you are seeing a podiatrist, please ask them to do this and document appropriately in the medical chart to close this gap (and show that it was done). If you don't see a podiatrist regularly, please remind me at a  subsequent visit here, and I will do it.  Please schedule a diabetic eye exam.  Return in 6 months for a follow-up visit, sooner if any problems arise.

## 2022-10-29 ENCOUNTER — Encounter: Payer: Self-pay | Admitting: Family Medicine

## 2022-10-29 ENCOUNTER — Telehealth: Payer: Self-pay | Admitting: Family Medicine

## 2022-10-29 ENCOUNTER — Ambulatory Visit (INDEPENDENT_AMBULATORY_CARE_PROVIDER_SITE_OTHER): Payer: Medicare Other | Admitting: Family Medicine

## 2022-10-29 VITALS — BP 139/72 | HR 74 | Ht 59.5 in | Wt 124.0 lb

## 2022-10-29 DIAGNOSIS — R809 Proteinuria, unspecified: Secondary | ICD-10-CM

## 2022-10-29 DIAGNOSIS — M8580 Other specified disorders of bone density and structure, unspecified site: Secondary | ICD-10-CM

## 2022-10-29 DIAGNOSIS — R197 Diarrhea, unspecified: Secondary | ICD-10-CM

## 2022-10-29 DIAGNOSIS — E1169 Type 2 diabetes mellitus with other specified complication: Secondary | ICD-10-CM

## 2022-10-29 DIAGNOSIS — E118 Type 2 diabetes mellitus with unspecified complications: Secondary | ICD-10-CM

## 2022-10-29 DIAGNOSIS — G620 Drug-induced polyneuropathy: Secondary | ICD-10-CM | POA: Diagnosis not present

## 2022-10-29 DIAGNOSIS — T451X5A Adverse effect of antineoplastic and immunosuppressive drugs, initial encounter: Secondary | ICD-10-CM

## 2022-10-29 DIAGNOSIS — Z0000 Encounter for general adult medical examination without abnormal findings: Secondary | ICD-10-CM

## 2022-10-29 DIAGNOSIS — E039 Hypothyroidism, unspecified: Secondary | ICD-10-CM

## 2022-10-29 DIAGNOSIS — E785 Hyperlipidemia, unspecified: Secondary | ICD-10-CM

## 2022-10-29 DIAGNOSIS — E871 Hypo-osmolality and hyponatremia: Secondary | ICD-10-CM | POA: Diagnosis not present

## 2022-10-29 DIAGNOSIS — I700 Atherosclerosis of aorta: Secondary | ICD-10-CM | POA: Diagnosis not present

## 2022-10-29 DIAGNOSIS — C779 Secondary and unspecified malignant neoplasm of lymph node, unspecified: Secondary | ICD-10-CM

## 2022-10-29 DIAGNOSIS — C439 Malignant melanoma of skin, unspecified: Secondary | ICD-10-CM

## 2022-10-29 MED ORDER — LOSARTAN POTASSIUM 25 MG PO TABS: 25.0000 mg | ORAL_TABLET | Freq: Every day | ORAL | 1 refills | Status: DC

## 2022-10-29 NOTE — Telephone Encounter (Signed)
AVS ready to be mailed, and she is scheduled in March for med check.

## 2022-10-29 NOTE — Telephone Encounter (Signed)
-----   Message from Rita Ohara, MD sent at 10/29/2022  2:26 PM EST ----- Please mail a copy of her After Visit Summary to her house.  Please schedule a 3 month med check (in office, non-fasting)

## 2022-11-02 ENCOUNTER — Other Ambulatory Visit: Payer: Self-pay | Admitting: Family Medicine

## 2022-11-02 DIAGNOSIS — C4371 Malignant melanoma of right lower limb, including hip: Secondary | ICD-10-CM | POA: Diagnosis not present

## 2022-11-02 DIAGNOSIS — B3781 Candidal esophagitis: Secondary | ICD-10-CM | POA: Diagnosis not present

## 2022-11-02 DIAGNOSIS — E1149 Type 2 diabetes mellitus with other diabetic neurological complication: Secondary | ICD-10-CM

## 2022-11-02 DIAGNOSIS — C439 Malignant melanoma of skin, unspecified: Secondary | ICD-10-CM | POA: Diagnosis not present

## 2022-11-02 DIAGNOSIS — Z79899 Other long term (current) drug therapy: Secondary | ICD-10-CM | POA: Diagnosis not present

## 2022-11-02 DIAGNOSIS — C779 Secondary and unspecified malignant neoplasm of lymph node, unspecified: Secondary | ICD-10-CM | POA: Diagnosis not present

## 2022-11-06 ENCOUNTER — Ambulatory Visit: Payer: Self-pay

## 2022-11-06 ENCOUNTER — Ambulatory Visit: Payer: Medicare Other | Admitting: Podiatry

## 2022-11-06 NOTE — Patient Outreach (Signed)
  Care Coordination   11/06/2022 Name: Reia Viernes MRN: [id number] DOB: 07-Aug-1936   Care Coordination Outreach Attempts:  An unsuccessful telephone outreach was attempted today to offer the patient information about available care coordination services as a benefit of their health plan.   Follow Up Plan:  Additional outreach attempts will be made to offer the patient care coordination information and services.   Encounter Outcome:  No Answer   Care Coordination Interventions:  No, not indicated    Barb Merino, RN, BSN, CCM Care Management Coordinator [facility] Care Management  Direct Phone: [phone]

## 2022-11-09 DIAGNOSIS — C779 Secondary and unspecified malignant neoplasm of lymph node, unspecified: Secondary | ICD-10-CM | POA: Diagnosis not present

## 2022-11-09 DIAGNOSIS — C439 Malignant melanoma of skin, unspecified: Secondary | ICD-10-CM | POA: Diagnosis not present

## 2022-11-09 DIAGNOSIS — C4371 Malignant melanoma of right lower limb, including hip: Secondary | ICD-10-CM | POA: Diagnosis not present

## 2022-11-27 DIAGNOSIS — C4371 Malignant melanoma of right lower limb, including hip: Secondary | ICD-10-CM | POA: Diagnosis not present

## 2022-11-27 DIAGNOSIS — C779 Secondary and unspecified malignant neoplasm of lymph node, unspecified: Secondary | ICD-10-CM | POA: Diagnosis not present

## 2022-11-27 DIAGNOSIS — Z5111 Encounter for antineoplastic chemotherapy: Secondary | ICD-10-CM | POA: Diagnosis not present

## 2022-11-28 ENCOUNTER — Other Ambulatory Visit: Payer: Self-pay | Admitting: Family Medicine

## 2022-11-28 DIAGNOSIS — E1149 Type 2 diabetes mellitus with other diabetic neurological complication: Secondary | ICD-10-CM

## 2022-11-30 ENCOUNTER — Other Ambulatory Visit: Payer: Self-pay | Admitting: Family Medicine

## 2022-11-30 DIAGNOSIS — E1149 Type 2 diabetes mellitus with other diabetic neurological complication: Secondary | ICD-10-CM

## 2022-11-30 NOTE — Progress Notes (Signed)
12/13/22  [place]  Direct Dial: [phone] .

## 2022-12-11 DIAGNOSIS — Z5112 Encounter for antineoplastic immunotherapy: Secondary | ICD-10-CM | POA: Diagnosis not present

## 2022-12-11 DIAGNOSIS — C779 Secondary and unspecified malignant neoplasm of lymph node, unspecified: Secondary | ICD-10-CM | POA: Diagnosis not present

## 2022-12-11 DIAGNOSIS — C4371 Malignant melanoma of right lower limb, including hip: Secondary | ICD-10-CM | POA: Diagnosis not present

## 2022-12-13 ENCOUNTER — Other Ambulatory Visit: Payer: Self-pay | Admitting: Family Medicine

## 2022-12-13 ENCOUNTER — Ambulatory Visit: Payer: Self-pay

## 2022-12-13 DIAGNOSIS — E039 Hypothyroidism, unspecified: Secondary | ICD-10-CM

## 2022-12-13 NOTE — Patient Instructions (Signed)
Visit Information  Thank you for taking time to visit with me today. Please don't hesitate to contact me if I can be of assistance to you.   Following are the goals we discussed today:   Goals Addressed               This Visit's Progress     Patient Stated     "To tolerate the new cancer therapy" (pt-stated)        Care Coordination Interventions: Assessment of understanding of oncology diagnosis:  Determined patient continues to receive Imlygic injections for treatment of Melanoma cancer treatment  Determined patient is tolerating this treatment without noted SE, reports her skin nodules are shrinking Discussed patient is scheduled to receive 2 additional injections during the month of February Instructed patient to notify her doctor of new symptoms or concerns       I am cutting back on my carbs (pt-stated)        Care Coordination Interventions: Evaluation of current treatment plan related to diabetes and patient's adherence to plan as established by provider Determined patient completed a follow up with her PCP on 10/29/22 Review of patient status, including review of consultant's reports, relevant laboratory and other test results, and medications completed Mailed printed educational materials related to Diabetes Management              Our next appointment is by telephone on 02/01/23 at 1 PM  Please call the care guide team at [phone] if you need to cancel or reschedule your appointment.   If you are experiencing a Mental Health or [place] or need someone to talk to, please go to [facility] [address] [place] [phone])  Patient verbalizes understanding of instructions and care plan provided today and agrees to view in [place]. Active MyChart status and patient understanding of how to access instructions and care plan via MyChart confirmed with patient.     Barb Merino, RN, BSN, CCM Care  Management Coordinator [facility] Care Management Direct Phone: [phone]

## 2022-12-13 NOTE — Patient Outreach (Signed)
  Care Coordination   Follow Up Visit Note   12/13/2022 Name: Jem Castro MRN: [id number] DOB: [birth year]  Elloise Roark is a 87 y.o. year old female who sees Rita Ohara, MD for primary care. I spoke with  [name] by phone today.  What matters to the patients health and wellness today?  Patient would like to complete her Chemo treatments over the next month. She will continue to adhere to her diabetes diet.     Goals Addressed               This Visit's Progress     Patient Stated     "To tolerate the new cancer therapy" (pt-stated)        Care Coordination Interventions: Assessment of understanding of oncology diagnosis:  Determined patient continues to receive Imlygic injections for treatment of Melanoma cancer treatment  Determined patient is tolerating this treatment without noted SE, reports her skin nodules are shrinking Discussed patient is scheduled to receive 2 additional injections during the month of February Instructed patient to notify her doctor of new symptoms or concerns       I am cutting back on my carbs (pt-stated)        Care Coordination Interventions: Evaluation of current treatment plan related to diabetes and patient's adherence to plan as established by provider Determined patient completed a follow up with her PCP on 10/29/22 Review of patient status, including review of consultant's reports, relevant laboratory and other test results, and medications completed Mailed printed educational materials related to Diabetes Management              SDOH assessments and interventions completed:  No     Care Coordination Interventions:  Yes, provided   Follow up plan: Follow up call scheduled for 02/01/23 '@1'$  PM    Encounter Outcome:  Pt. Visit Completed

## 2022-12-28 DIAGNOSIS — C779 Secondary and unspecified malignant neoplasm of lymph node, unspecified: Secondary | ICD-10-CM | POA: Diagnosis not present

## 2022-12-28 DIAGNOSIS — C439 Malignant melanoma of skin, unspecified: Secondary | ICD-10-CM | POA: Diagnosis not present

## 2022-12-28 DIAGNOSIS — C4371 Malignant melanoma of right lower limb, including hip: Secondary | ICD-10-CM | POA: Diagnosis not present

## 2022-12-28 DIAGNOSIS — Z5112 Encounter for antineoplastic immunotherapy: Secondary | ICD-10-CM | POA: Diagnosis not present

## 2023-01-11 DIAGNOSIS — C779 Secondary and unspecified malignant neoplasm of lymph node, unspecified: Secondary | ICD-10-CM | POA: Diagnosis not present

## 2023-01-11 DIAGNOSIS — C4371 Malignant melanoma of right lower limb, including hip: Secondary | ICD-10-CM | POA: Diagnosis not present

## 2023-01-11 DIAGNOSIS — Z5111 Encounter for antineoplastic chemotherapy: Secondary | ICD-10-CM | POA: Diagnosis not present

## 2023-01-29 NOTE — Progress Notes (Unsigned)
No chief complaint on file.  Patient presents for 3 month follow-up on chronic problems.  Diabetes:  Last A1c was normal at 5.3 (09/2022). She had lost a lot of weight.  We had discussed stopping pioglitazone vs lowering metformin, and she preferred the latter, due to having diarrhea from chemo.  She remains on Bydureon B-cise, pioglitazone, and decreased the metformin to 1/2 tablet twice daily. We also discussed trying to cut back on the Mg to qod to see if that helped with her diarrhea.  Sugars are running  She is past due for diabetic eye exam (hadn't wanted to go too many places while on chemo). SCHEDULED?? She has microalbuminuria, and was restarted on low dose losartan at last visit. She checks her feet regularly and denies concerns.  Chemistries have been monitored regularly through [facility]. She has had stable mild hyponatremia, and low chloride, rest okay. (? Cause--related to chemo vs diarrhea)  Component 01/11/23 12/28/22 12/11/22 11/27/22 11/09/22 11/02/22  HX SODIUM 131 Low  131 Low  131 Low  133 Low  133 Low  133 Low   HX POTASSIUM 3.5  3.8  3.6  3.6  3.6  3.9   HX CHLORIDE 94 Low  93 Low  93 Low  95 Low  94 Low  96 Low   HX CO2 31 31 32 High  30 32 High  28  HX BUN '13 15 14 17 13 12  '$ HX GLUCOSE 126 High   136 High  118 High   108 High  111 High   85   HX CREATININE 0.72 0.75 0.65 0.79 0.76 0.77  HX CALCIUM 9.8 10.2 9.6 9.8 9.8 9.9  HX TOTAL PROTEIN 7.2  7.5 7.3  7.4 7.1  7.5   HX ALBUMIN 4.2 4.4 4.2 4.4 4.2 4.3  HX BILIRUBIN TOTAL 0.5  0.5 0.5  0.4 0.5  0.4   HX ALKALINE PHOSPHATASE 51 54 51 62 54 58  HX AST (SGOT) '19 21 21 22 22 22  '$ HX ALT (SGPT) '12 12 13 12 11 10  '$ HX ANION GAP (WC) '6 7 6 8 7 9  '$ HX EST. GFR 83  79  87  74  77  76    Hypertension follow-up:  She previously took losartan '100mg'$ . This was stopped after hospitalization for GI bleed, when BP's were low.  At her AWV, she reported BP's ranging from 120-139/60's-70's.  She was restarted on a lower dose, 25 mg of  losartan daily (also for microalbuminuria).  She denies side effects.  She continues to try and limit the sodium in her diet. BP's are now running  Denies dizziness, headaches, chest pain, DOE, edema. Potassium and Cr have been monitored regularly through [facility], since resuming ARB, see above. BP Readings from Last 3 Encounters:  10/29/22 139/72  09/06/22 120/61  05/09/22 120/60    Hypothyroidism:  She has been on 75 mcg of levothyroxine 6 days/week since October. Her last TSH was improved at 5.320, on this regimen.  At that visit she reported no changes to her chronic alopecia, her nails had improved (had been more brittle due to chemo), and she had some skin changes (dry, flaky) which she related to the chemo.  Given her diarrhea and weight loss (some related to chemo, some intentional), and no symptoms related to her thyroid being underactive, we elected to continue this dose.  She is due for recheck today.  Today she reports  TAKING BIOTIN OR STOPPED??  Lab Results  Component  Value Date   TSH 5.320 (H) 10/22/2022   She had TSH repeated in 10/2022 at [facility] and was WNL. Component 11/02/22 08/17/22 05/30/22 05/09/22 05/02/22 04/20/22  HX TSH 3.74  11.88 High   13.88 High   18.77 High   22.08 High   17.76 High      H/o lower GI bleed in April 2023. AVM treated during colonoscopy. There were multiple erosions of the colon, felt to be related to use of NSAIDs (which she has stopped using, no longer takes Aleve, just Tylenol). Normal stomach/EGD.  Candida esophagitis was noted, and was treated with fluconazole.  She denies any recurrent melena, denies abdominal pain, hematochezia, chest pain, dysphagia. CBC's are monitored regularly through [facility], and Hgb has been normal.  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 She continues [facility]. She had her final injection of Imlygic on 01/11/23. PET scan done in 10/2022: CONCLUSION:  Decreased size and activity of the soft tissue nodules within the subcutaneous anterior right thigh and the superficial anteromedial right leg. No new sites of disease or residual FDG avid lesions.  ANCILLARY CT FINDINGS:  Heavy coronary artery calcifications. Calcification of the thoracic aortic including the aortic root. Heavy mitral annular calcification versus annuloplasty. Mild mosaic attenuation of the lungs which can be seen with air trapping. Distended gallbladder without inflammatory changes. Colonic diverticula. Unchanged calcified splenic aneurysm near the hilum. Diffuse calcified atherosclerosis. Polyarticular degenerative changes. Osteopenia.   She is due to f/u with them in May.   Hyperlipidemia--She is compliant with her medications, taking '20mg'$  of simvastatin and '3000mg'$  of fish oil. Denies any side effects to the medications, and is following a lowfat, low cholesterol diet.  Lipids were at goal on last check:   Lab Results  Component Value Date   CHOL 165 02/26/2022   HDL 83 02/26/2022   LDLCALC 69 02/26/2022   TRIG 69 02/26/2022   CHOLHDL 2.0 02/26/2022     PMH, PSH, SH reviewed    ROS:  No fever, chills, URI symptoms, headaches, dizziness, chest pain, palpitations, syncope, dyspnea on exertion, swelling, nausea, vomiting, abdominal pain, melena, hematochezia, indigestion/heartburn, hematuria, incontinence (just with cough/sneeze, and some with urge), or vaginal bleeding. No joint pains, weakness, tremor, suspicious, or enlarged lymph nodes. Some easy bruising. Diarrhea? +tingling in both hands, L>R,  no longer in her feet. Dry mouth and dry skin Up 2-3x/night to void. Denies dysuria, hematuria.   PHYSICAL EXAM:  There were no vitals taken for this visit.  Wt Readings from Last 3 Encounters:  10/29/22 124 lb (56.2 kg)  09/06/22 130 lb (59 kg)  05/09/22 142 lb (64.4 kg)   Elderly female, in no distress. Hard of hearing. Accompanied by her daughter. HEENT: conjunctiva and sclera are clear, EOMI Neck: no lymphadenopathy, thyromegaly or mass Heart: regular rate and rhythm Lungs: clear bilaterally Extremities: no edema. +thickened toenails x 10.  Some white discoloration/peeling  around the nailbeds, most prominent at the L great toenail.  Neuro: alert and oriented, cranial nerves grossly intact.   Skin: normal turgor, no rashes Psych: normal mood, affect, hygiene and grooming.  ***UPDATE if daughter not present UPDATE toes/skin   ASSESSMENT/PLAN:  DIABETIC FOOT EXAM  Does she have diabetes eye exam scheduled? Did she schedule DEXA yet? Did she get RSV vaccine?  Has she been taking Biotin, or did she stop it recently, in anticipation of thyroid test??  A1c Urine microalb, TSH, Mg Lipids, if fasting (since almost a year, and should be euthyroid) RF simvastatin after labs back  Need to change metformin Rx (currently written for 180, for a year from Sept, directions changed)  AWV due in December Schedule med check in 4 months (split the difference)

## 2023-01-30 ENCOUNTER — Encounter: Payer: Self-pay | Admitting: Family Medicine

## 2023-01-30 ENCOUNTER — Ambulatory Visit (INDEPENDENT_AMBULATORY_CARE_PROVIDER_SITE_OTHER): Payer: Medicare Other | Admitting: Family Medicine

## 2023-01-30 VITALS — BP 130/70 | HR 80 | Ht 59.5 in | Wt 125.2 lb

## 2023-01-30 DIAGNOSIS — T451X5A Adverse effect of antineoplastic and immunosuppressive drugs, initial encounter: Secondary | ICD-10-CM

## 2023-01-30 DIAGNOSIS — E871 Hypo-osmolality and hyponatremia: Secondary | ICD-10-CM

## 2023-01-30 DIAGNOSIS — I700 Atherosclerosis of aorta: Secondary | ICD-10-CM

## 2023-01-30 DIAGNOSIS — E118 Type 2 diabetes mellitus with unspecified complications: Secondary | ICD-10-CM | POA: Diagnosis not present

## 2023-01-30 DIAGNOSIS — E785 Hyperlipidemia, unspecified: Secondary | ICD-10-CM

## 2023-01-30 DIAGNOSIS — Z5181 Encounter for therapeutic drug level monitoring: Secondary | ICD-10-CM

## 2023-01-30 DIAGNOSIS — C779 Secondary and unspecified malignant neoplasm of lymph node, unspecified: Secondary | ICD-10-CM

## 2023-01-30 DIAGNOSIS — G620 Drug-induced polyneuropathy: Secondary | ICD-10-CM | POA: Diagnosis not present

## 2023-01-30 DIAGNOSIS — M8580 Other specified disorders of bone density and structure, unspecified site: Secondary | ICD-10-CM | POA: Diagnosis not present

## 2023-01-30 DIAGNOSIS — E039 Hypothyroidism, unspecified: Secondary | ICD-10-CM | POA: Diagnosis not present

## 2023-01-30 DIAGNOSIS — R809 Proteinuria, unspecified: Secondary | ICD-10-CM

## 2023-01-30 DIAGNOSIS — C439 Malignant melanoma of skin, unspecified: Secondary | ICD-10-CM | POA: Diagnosis not present

## 2023-01-30 DIAGNOSIS — E1169 Type 2 diabetes mellitus with other specified complication: Secondary | ICD-10-CM | POA: Diagnosis not present

## 2023-01-30 LAB — POCT GLYCOSYLATED HEMOGLOBIN (HGB A1C): Hemoglobin A1C: 5.7 % — AB (ref 4.0–5.6)

## 2023-01-30 MED ORDER — METFORMIN HCL 500 MG PO TABS: 500.0000 mg | ORAL_TABLET | Freq: Two times a day (BID) | ORAL | 0 refills | Status: DC

## 2023-01-30 NOTE — Patient Instructions (Addendum)
Please let us know if you are still taking Biotin. If you are, you should stop it for now, until your blood tests, then restart after your labs are done.  This medication can affect the TSH value (it doesn't actually affect your thyroid, just the test results) In general, it is fine to take Biotin (to help with hair and nails), but it should be stopped 1-2 weeks prior to any blood tests for your thyroid.  If you are not currently taking it, you do not have to restart it if you don't want to.  I'd like to wait to see your cholesterol results before refilling the cholesterol medication.  I don't want you to run out though.  You thought you might have enough.  Contact your pharmacy when you need it refilled.

## 2023-01-31 LAB — MICROALBUMIN / CREATININE URINE RATIO
Creatinine, Urine: 43.5 mg/dL
Microalb/Creat Ratio: 145 mg/g creat — ABNORMAL HIGH (ref 0–29)
Microalbumin, Urine: 63.2 ug/mL

## 2023-02-01 ENCOUNTER — Ambulatory Visit: Payer: Self-pay

## 2023-02-01 DIAGNOSIS — C4371 Malignant melanoma of right lower limb, including hip: Secondary | ICD-10-CM | POA: Diagnosis not present

## 2023-02-01 DIAGNOSIS — C439 Malignant melanoma of skin, unspecified: Secondary | ICD-10-CM | POA: Diagnosis not present

## 2023-02-01 DIAGNOSIS — C779 Secondary and unspecified malignant neoplasm of lymph node, unspecified: Secondary | ICD-10-CM | POA: Diagnosis not present

## 2023-02-01 DIAGNOSIS — R9389 Abnormal findings on diagnostic imaging of other specified body structures: Secondary | ICD-10-CM | POA: Diagnosis not present

## 2023-02-01 LAB — COMPREHENSIVE METABOLIC PANEL: eGFR: 68

## 2023-02-01 LAB — BASIC METABOLIC PANEL: Creatinine: 0.9 (ref 0.5–1.1)

## 2023-02-01 NOTE — Patient Outreach (Signed)
  Care Coordination   02/01/2023 Name: Tonya Rogers MRN: [id number] DOB: 1935/12/17   Care Coordination Outreach Attempts:  An unsuccessful telephone outreach was attempted for a scheduled appointment today.  Follow Up Plan:  Additional outreach attempts will be made to offer the patient care coordination information and services.   Encounter Outcome:  No Answer   Care Coordination Interventions:  No, not indicated    Barb Merino, RN, BSN, CCM Care Management Coordinator [facility] Care Management  Direct Phone: [phone]

## 2023-02-04 ENCOUNTER — Telehealth: Payer: Self-pay | Admitting: *Deleted

## 2023-02-04 NOTE — Telephone Encounter (Signed)
Patient called to let you know that she was taking biotin but she did stop after her visit last Wednesday. She has labs this Thursday-she will resume after those.

## 2023-02-07 ENCOUNTER — Other Ambulatory Visit: Payer: Medicare Other

## 2023-02-07 DIAGNOSIS — Z5181 Encounter for therapeutic drug level monitoring: Secondary | ICD-10-CM

## 2023-02-07 DIAGNOSIS — E1169 Type 2 diabetes mellitus with other specified complication: Secondary | ICD-10-CM | POA: Diagnosis not present

## 2023-02-07 DIAGNOSIS — E785 Hyperlipidemia, unspecified: Secondary | ICD-10-CM | POA: Diagnosis not present

## 2023-02-07 DIAGNOSIS — E039 Hypothyroidism, unspecified: Secondary | ICD-10-CM

## 2023-02-08 LAB — MAGNESIUM: Magnesium: 1.8 mg/dL (ref 1.6–2.3)

## 2023-02-08 LAB — LIPID PANEL
Chol/HDL Ratio: 1.9 ratio (ref 0.0–4.4)
Cholesterol, Total: 167 mg/dL (ref 100–199)
HDL: 89 mg/dL (ref >39)
LDL Chol Calc (NIH): 65 mg/dL (ref 0–99)
Triglycerides: 69 mg/dL (ref 0–149)
VLDL Cholesterol Cal: 13 mg/dL (ref 5–40)

## 2023-02-08 LAB — TSH: TSH: 3.94 u[IU]/mL (ref 0.450–4.500)

## 2023-02-11 ENCOUNTER — Other Ambulatory Visit: Payer: Self-pay | Admitting: Family Medicine

## 2023-02-11 DIAGNOSIS — E7800 Pure hypercholesterolemia, unspecified: Secondary | ICD-10-CM

## 2023-02-19 DIAGNOSIS — Z780 Asymptomatic menopausal state: Secondary | ICD-10-CM | POA: Diagnosis not present

## 2023-02-19 DIAGNOSIS — M8589 Other specified disorders of bone density and structure, multiple sites: Secondary | ICD-10-CM | POA: Diagnosis not present

## 2023-02-19 DIAGNOSIS — Z1231 Encounter for screening mammogram for malignant neoplasm of breast: Secondary | ICD-10-CM | POA: Diagnosis not present

## 2023-02-19 LAB — HM DEXA SCAN

## 2023-02-25 ENCOUNTER — Other Ambulatory Visit: Payer: Self-pay | Admitting: Family Medicine

## 2023-02-25 DIAGNOSIS — E1149 Type 2 diabetes mellitus with other diabetic neurological complication: Secondary | ICD-10-CM

## 2023-02-26 DIAGNOSIS — R922 Inconclusive mammogram: Secondary | ICD-10-CM | POA: Diagnosis not present

## 2023-02-26 DIAGNOSIS — R921 Mammographic calcification found on diagnostic imaging of breast: Secondary | ICD-10-CM | POA: Diagnosis not present

## 2023-02-26 DIAGNOSIS — R928 Other abnormal and inconclusive findings on diagnostic imaging of breast: Secondary | ICD-10-CM | POA: Diagnosis not present

## 2023-02-26 LAB — HM MAMMOGRAPHY

## 2023-02-27 ENCOUNTER — Encounter: Payer: Self-pay | Admitting: *Deleted

## 2023-02-28 ENCOUNTER — Encounter: Payer: Self-pay | Admitting: *Deleted

## 2023-03-12 ENCOUNTER — Ambulatory Visit: Payer: Self-pay

## 2023-03-12 NOTE — Patient Outreach (Signed)
  Care Coordination   Follow Up Visit Note   03/12/2023 Name: Tonya Rogers MRN: [id number] DOB: 1936-05-13  Tonya Rogers is a 87 y.o. year old female who sees Joselyn Arrow, MD for primary care. I spoke with  Marisa Sprinkles by phone today.  What matters to the patients health and wellness today?  Patient will follow her MD recommendations for Cancer surveillance.     Goals Addressed               This Visit's Progress     Patient Stated     COMPLETED: "To tolerate the new cancer therapy" (pt-stated)        Care Coordination Interventions: Reviewed and discussed with patient her recent Oncology follow up: Melanoma RLE responded to intratumoral TVEC RECOMMENDATIONS Reviewed labs results. Continue to monitor melanoma off systemic treatment. Follow up: F/U with Dr. Lenis Noon to include PET in 03/2023 RTC 06/2023, visit and labs RTC sooner if problems (eg if findings on PET) Determined patient verbalizes understanding of her prescribed treatment plan  Educated patient on the importance of staying well hydrated and following a well balanced diet  Instructed patient to keep her doctor well informed of new symptoms or concerns       Interventions Today    Flowsheet Row Most Recent Value  Chronic Disease   Chronic disease during today's visit Other  [Cancer]  General Interventions   General Interventions Discussed/Reviewed General Interventions Discussed, General Interventions Reviewed, Doctor Visits  Doctor Visits Discussed/Reviewed Specialist, Doctor Visits Discussed, Doctor Visits Reviewed  Education Interventions   Education Provided Provided Education  Provided Verbal Education On Nutrition, Medication, When to see the doctor  Nutrition Interventions   Nutrition Discussed/Reviewed Nutrition Discussed, Nutrition Reviewed, Fluid intake          SDOH assessments and interventions completed:  No     Care Coordination Interventions:  Yes, provided   Follow up  plan: Follow up call scheduled for 06/07/23 :30 AM    Encounter Outcome:  Pt. Visit Completed

## 2023-03-12 NOTE — Patient Instructions (Signed)
Visit Information  Thank you for taking time to visit with me today. Please don't hesitate to contact me if I can be of assistance to you.   Following are the goals we discussed today:   Goals Addressed               This Visit's Progress     Patient Stated     COMPLETED: "To tolerate the new cancer therapy" (pt-stated)        Care Coordination Interventions: Reviewed and discussed with patient her recent Oncology follow up: Melanoma RLE responded to intratumoral TVEC RECOMMENDATIONS Reviewed labs results. Continue to monitor melanoma off systemic treatment. Follow up: F/U with Dr. Lenis Noon to include PET in 03/2023 RTC 06/2023, visit and labs RTC sooner if problems (eg if findings on PET) Determined patient verbalizes understanding of her prescribed treatment plan  Educated patient on the importance of staying well hydrated and following a well balanced diet  Instructed patient to keep her doctor well informed of new symptoms or concerns         Our next appointment is by telephone on 06/07/23 at 09:30 AM  Please call the care guide team at [phone] if you need to cancel or reschedule your appointment.   If you are experiencing a Mental Health or Behavioral Health Crisis or need someone to talk to, please call 1-800-273-TALK (toll free, 24 hour hotline) go to [facility] [address] [place] [phone])  Patient verbalizes understanding of instructions and care plan provided today and agrees to view in MyChart. Active MyChart status and patient understanding of how to access instructions and care plan via MyChart confirmed with patient.     Delsa Sale, RN, BSN, CCM Care Management Coordinator [facility] Care Management Direct Phone: [phone]

## 2023-03-18 ENCOUNTER — Ambulatory Visit: Payer: Medicare Other | Admitting: Family Medicine

## 2023-04-10 DIAGNOSIS — Z7984 Long term (current) use of oral hypoglycemic drugs: Secondary | ICD-10-CM | POA: Diagnosis not present

## 2023-04-10 DIAGNOSIS — Z961 Presence of intraocular lens: Secondary | ICD-10-CM | POA: Diagnosis not present

## 2023-04-10 DIAGNOSIS — E119 Type 2 diabetes mellitus without complications: Secondary | ICD-10-CM | POA: Diagnosis not present

## 2023-04-10 LAB — HM DIABETES EYE EXAM

## 2023-04-17 ENCOUNTER — Encounter: Payer: Self-pay | Admitting: *Deleted

## 2023-04-23 DIAGNOSIS — C779 Secondary and unspecified malignant neoplasm of lymph node, unspecified: Secondary | ICD-10-CM | POA: Diagnosis not present

## 2023-04-23 DIAGNOSIS — Z79899 Other long term (current) drug therapy: Secondary | ICD-10-CM | POA: Diagnosis not present

## 2023-04-23 DIAGNOSIS — C439 Malignant melanoma of skin, unspecified: Secondary | ICD-10-CM | POA: Diagnosis not present

## 2023-04-26 DIAGNOSIS — C439 Malignant melanoma of skin, unspecified: Secondary | ICD-10-CM | POA: Diagnosis not present

## 2023-04-26 DIAGNOSIS — T50905A Adverse effect of unspecified drugs, medicaments and biological substances, initial encounter: Secondary | ICD-10-CM | POA: Diagnosis not present

## 2023-04-26 DIAGNOSIS — C779 Secondary and unspecified malignant neoplasm of lymph node, unspecified: Secondary | ICD-10-CM | POA: Diagnosis not present

## 2023-04-26 DIAGNOSIS — L658 Other specified nonscarring hair loss: Secondary | ICD-10-CM | POA: Diagnosis not present

## 2023-05-01 ENCOUNTER — Other Ambulatory Visit: Payer: Self-pay | Admitting: *Deleted

## 2023-05-01 DIAGNOSIS — E118 Type 2 diabetes mellitus with unspecified complications: Secondary | ICD-10-CM

## 2023-05-01 MED ORDER — METFORMIN HCL 500 MG PO TABS
500.0000 mg | ORAL_TABLET | Freq: Two times a day (BID) | ORAL | 0 refills | Status: DC
Start: 2023-05-01 — End: 2023-07-02

## 2023-05-13 ENCOUNTER — Other Ambulatory Visit: Payer: Self-pay | Admitting: Family Medicine

## 2023-05-13 DIAGNOSIS — R809 Proteinuria, unspecified: Secondary | ICD-10-CM

## 2023-05-19 ENCOUNTER — Other Ambulatory Visit: Payer: Self-pay | Admitting: Family Medicine

## 2023-05-19 DIAGNOSIS — E7800 Pure hypercholesterolemia, unspecified: Secondary | ICD-10-CM

## 2023-05-27 DIAGNOSIS — D225 Melanocytic nevi of trunk: Secondary | ICD-10-CM | POA: Diagnosis not present

## 2023-05-27 DIAGNOSIS — Z08 Encounter for follow-up examination after completed treatment for malignant neoplasm: Secondary | ICD-10-CM | POA: Diagnosis not present

## 2023-05-27 DIAGNOSIS — L570 Actinic keratosis: Secondary | ICD-10-CM | POA: Diagnosis not present

## 2023-05-27 DIAGNOSIS — L821 Other seborrheic keratosis: Secondary | ICD-10-CM | POA: Diagnosis not present

## 2023-05-27 DIAGNOSIS — L814 Other melanin hyperpigmentation: Secondary | ICD-10-CM | POA: Diagnosis not present

## 2023-05-27 DIAGNOSIS — Z85820 Personal history of malignant melanoma of skin: Secondary | ICD-10-CM | POA: Diagnosis not present

## 2023-06-03 ENCOUNTER — Telehealth: Payer: Self-pay | Admitting: *Deleted

## 2023-06-03 NOTE — Telephone Encounter (Signed)
No labs, just A1c at visit

## 2023-06-03 NOTE — Telephone Encounter (Signed)
Patient advised.

## 2023-06-03 NOTE — Telephone Encounter (Signed)
Patient called to see if you wanted her to have labs prior to Wed visit. I told her I thought it was safe to say you did not but if needed she could come tomorrow. Can you confirm?

## 2023-06-04 NOTE — Progress Notes (Signed)
No chief complaint on file.  Patient presents for 6 month f/u on chronic problems.   Hypothyroidism:  Last check in March was at goal, taking 75 mcg 6 days/week (none on Sunday).   Her nails are growing well, but breaks somewhat easily.  Diarrhea is improved. No changes to energy, moods, She doesn't think she has been taking any biotin.  Moods have remained good.  Worsening hair loss--she was given referral for wig by Dr. Lenis Noon at [facility].  Weight? Appetite?  Lab Results  Component Value Date   TSH 3.940 02/07/2023     DM: Doing well on the Bydureon B-cise, pioglitazone and metformin.  Metformin dose was decreased to 1/2 tablet twice daily in December, due to having diarrhea, and A1c was 5.3%. Recheck in 01/2023 was A1c of 5.7% on this regimen. She also stopped Mg due to diarrhea. Mg was normal at 1.8 in 01/2023. Has some diarrhea that comes and goes, overall much improved.  Sugars are not checked often, running ***  Denies hypoglycemia, polydipsia or polyuria. 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 Not painful or burning. Last eye exam was 03/2023, no retinopathy. She checks her feet regularly, no sores/concerns. She has microalbuminuria (ratio elevated at 145 in 01/2023).  She is taking losartan 25mg  (restarted in December, after being off of 100mg  dose due to low BP's when she had GI bleed).    Hyperlipidemia--She is compliant with her medications, taking 20mg  of simvastatin and 3000mg  of fish oil. Denies any side effects to the medications, and is following a lowfat, low cholesterol diet.  Lipids were at goal on last check:  Lab Results  Component Value Date   CHOL 167 02/07/2023   HDL 89 02/07/2023   LDLCALC 65 02/07/2023   TRIG 69 02/07/2023   CHOLHDL 1.9 02/07/2023     Hypertension follow-up:  She previously took losartan 100mg . This was stopped after hospitalization for GI bleed, when BP's were low. It was  restarted at 25mg . She limits the sodium in her diet. She doesn't monitor BP at home, but reports normal values at other doctors. Denies dizziness, headaches, chest pain, DOE, edema.  BP Readings from Last 3 Encounters:  01/30/23 130/70  10/29/22 139/72  09/06/22 120/61      Osteoporosis:  She took fosamax x 5 years, stopped in 2011 due to jaw complications (jaw bones were "breaking apart"). She continues to take regular calcium, Vitamin D. She had repeat DEXA in 01/2023, which showed improvement in her wrist, still had bilateral osteopenia at hips.  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 She continues regular care at [facility]. She had her final injection of Imlygic on 01/11/23.  She last saw Dr. Lenis Noon end of May, after her PET scan, which looked good. She has worsening hair loss, and was referred for a wig. She last saw Dr. Burman Freestone in 01/2023, scheduled again for August, with labs.  They are just monitoring her at this point. She denies any skin concerns/recurrences. Hyponatremia has improved, last check was Na 135 in 01/2023.   PET 03/2023: Impression CONCLUSION: 1.  Minimal non FDG-avid stranding in the anterior right thigh in the region of a prior hypermetabolic nodule. 2.  No new sites of FDG avid disease.   Lower GI bleed in April 2023. AVM treated during colonoscopy. There were multiple erosions of the colon, felt to be related to use of NSAIDs (which she has stopped using, no longer takes Aleve,  just Tylenol). Normal stomach/EGD.  (Candida esophagitis was noted, and was treated with fluconazole).   She denies any recurrent melena, denies abdominal pain, hematochezia, chest pain, dysphagia. Last Hgb was 14.7 in 01/2023 (at [facility])    PMH, PSH, SH reviewed    ROS:  No fever, chills, URI symptoms, headaches, dizziness, chest pain, palpitations, syncope, dyspnea on exertion, swelling, nausea, vomiting, abdominal pain, melena, hematochezia, indigestion/heartburn. No joint pains, weakness,  tremor or enlarged lymph nodes. Some easy bruising, unchanged. Diarrhea is intermittent, overall improved.  +tingling in both hands, L>R, no longer in her feet. She is able to close her left hand into a fist better (wasn't able to at last visit).  Tingling is unchanged. Dry mouth and dry skin is improving. Up 1-2x/night to void. Denies dysuria, hematuria.  ***UPDATE ABOVE   PHYSICAL EXAM:  There were no vitals taken for this visit.  Wt Readings from Last 3 Encounters:  01/30/23 125 lb 3.2 oz (56.8 kg)  10/29/22 124 lb (56.2 kg)  09/06/22 130 lb (59 kg)   Elderly female, in no distress. Hard of hearing.  HEENT: conjunctiva and sclera are clear, EOMI. OP clear.  Significant alopecia across the entire top of her head.  Skin on scalp appears normal. Neck: no lymphadenopathy, thyromegaly or mass Heart: regular rate and rhythm Lungs: clear bilaterally Extremities: no edema. Very thickened, discolored, toenails x 10. Neuro: alert and oriented, cranial nerves grossly intact.   Skin: normal turgor, no rashes. Multiple hyperpigmented and slightly bluish focal skin discoloration along the R lower leg Psych: normal mood, affect, hygiene and grooming  ***update if toes seen. UPDATE SKIN RLE    ASSESSMENT/PLAN   POC A1c Does she need Synthroid refill??  PLEASE ABSTRACT CR AND EGFR TO GET RID OF CARE GAP.  SHE HAD THIS DONE AT [facility]-- 02/01/2023: Creatinine 0.60 - 1.20 mg/dL 9.60  eGFR >45 WU/[id number]/[id number] 68        ?Chauncey Mann candidate?? GFR 68, but microalbuminuria (145) If so, needs K 4 weeks after starting Common reactions --hyperkalemia, hypotension, hyponatremia (she already has this...), decreased GFR Discuss but hold off  F/u as scheduled in December for wellness visit.

## 2023-06-05 ENCOUNTER — Encounter: Payer: Self-pay | Admitting: Family Medicine

## 2023-06-05 ENCOUNTER — Ambulatory Visit (INDEPENDENT_AMBULATORY_CARE_PROVIDER_SITE_OTHER): Payer: Medicare Other | Admitting: Family Medicine

## 2023-06-05 VITALS — BP 132/70 | HR 73 | Temp 97.4°F | Resp 14 | Ht 59.75 in | Wt 131.2 lb

## 2023-06-05 DIAGNOSIS — C439 Malignant melanoma of skin, unspecified: Secondary | ICD-10-CM | POA: Diagnosis not present

## 2023-06-05 DIAGNOSIS — E039 Hypothyroidism, unspecified: Secondary | ICD-10-CM

## 2023-06-05 DIAGNOSIS — C779 Secondary and unspecified malignant neoplasm of lymph node, unspecified: Secondary | ICD-10-CM | POA: Diagnosis not present

## 2023-06-05 DIAGNOSIS — I700 Atherosclerosis of aorta: Secondary | ICD-10-CM | POA: Diagnosis not present

## 2023-06-05 DIAGNOSIS — M8580 Other specified disorders of bone density and structure, unspecified site: Secondary | ICD-10-CM | POA: Diagnosis not present

## 2023-06-05 DIAGNOSIS — E785 Hyperlipidemia, unspecified: Secondary | ICD-10-CM

## 2023-06-05 DIAGNOSIS — Z5181 Encounter for therapeutic drug level monitoring: Secondary | ICD-10-CM

## 2023-06-05 DIAGNOSIS — R809 Proteinuria, unspecified: Secondary | ICD-10-CM | POA: Diagnosis not present

## 2023-06-05 DIAGNOSIS — E118 Type 2 diabetes mellitus with unspecified complications: Secondary | ICD-10-CM

## 2023-06-05 DIAGNOSIS — E1169 Type 2 diabetes mellitus with other specified complication: Secondary | ICD-10-CM | POA: Diagnosis not present

## 2023-06-05 LAB — POCT GLYCOSYLATED HEMOGLOBIN (HGB A1C): Hemoglobin A1C: 5.6 % (ref 4.0–5.6)

## 2023-06-05 MED ORDER — BYDUREON BCISE 2 MG/0.85ML ~~LOC~~ AUIJ: AUTO-INJECTOR | SUBCUTANEOUS | 5 refills | Status: DC

## 2023-06-05 NOTE — Assessment & Plan Note (Signed)
Cont statin use

## 2023-06-05 NOTE — Assessment & Plan Note (Signed)
Completed treatments in Feb. PET looked good in May. Scheduled for f/u in August. Monitoring for now. No new lesions noted by patient.

## 2023-06-05 NOTE — Assessment & Plan Note (Signed)
Well controlled on bydureon BCise, pioglitazone and metformin (1/2 tab BID). GI symptoms improved with lower metformin dose. Sugars remain well controlled. Complications include HTN, HLD, microalbuminuria

## 2023-06-05 NOTE — Assessment & Plan Note (Signed)
Cont statin and low cholesterol diet. Lipids at goal on last check

## 2023-06-05 NOTE — Assessment & Plan Note (Signed)
H/o osteoporosis, s/p alendronate x 5 years (stopped 2011 due to jaw complications). DEXA 01/2023--Improved at wrist, still had osteopenia at hips bilaterally.  Encouraged Ca, D, weight-bearing exercise.

## 2023-06-05 NOTE — Assessment & Plan Note (Signed)
Recheck thyroid today, given weight gain, constipation

## 2023-06-05 NOTE — Assessment & Plan Note (Signed)
Cont f/u with Dr. Lenis Noon and Dr. Burman Freestone

## 2023-06-05 NOTE — Patient Instructions (Signed)
We are rechecking your thyroid today. Continue all current medications. Remember to get your flu shot and the newly updated COVID booster when it becomes available in the Fall.  Pay attention to the news or ask the pharmacist in the Fall about the frequency of RSV vaccines (you may not need it every year, they haven't made any announcement yet).

## 2023-06-05 NOTE — Assessment & Plan Note (Signed)
Cont losartan 25mg . Recheck urine microalbumin in December.  Consider Chauncey Mann in future?

## 2023-06-06 LAB — TSH: TSH: 3.81 u[IU]/mL (ref 0.450–4.500)

## 2023-06-06 MED ORDER — LEVOTHYROXINE SODIUM 75 MCG PO TABS: ORAL_TABLET | ORAL | 3 refills | Status: DC

## 2023-06-07 ENCOUNTER — Ambulatory Visit: Payer: Self-pay

## 2023-06-07 NOTE — Patient Outreach (Signed)
  Care Coordination   Follow Up Visit Note   06/07/2023 Name: Elizandra Killilea MRN: [id number] DOB: [birth year]  Aviella Kohls is a 87 y.o. year old female who sees Joselyn Arrow, MD for primary care. I spoke with  Marisa Sprinkles by phone today.  What matters to the patients health and wellness today?  Patient will continue to follow a strict low Carb diet.     Goals Addressed               This Visit's Progress     Patient Stated     COMPLETED: I am cutting back on my carbs (pt-stated)        Care Coordination Interventions: Evaluation of current treatment plan related to diabetes and patient's adherence to plan as established by provider Review of patient status, including review of consultant's reports, relevant laboratory and other test results, and medications completed Counseled on Diabetic diet, portion control, avoiding sugary foods/drinks, determined patient looks at labels and strictly limits carbohydrates Positive reinforcement given to patient for making efforts to improve her diabetes and overall health  Lab Results  Component Value Date   HGBA1C 5.6 06/05/2023     Interventions Today    Flowsheet Row Most Recent Value  Chronic Disease   Chronic disease during today's visit Diabetes, Other  [s/p melanoma,  thyroid]  General Interventions   General Interventions Discussed/Reviewed General Interventions Discussed, General Interventions Reviewed, Labs, Doctor Visits  Doctor Visits Discussed/Reviewed PCP, Doctor Visits Reviewed, Doctor Visits Discussed, Specialist  Education Interventions   Education Provided Provided Education  Provided Verbal Education On Nutrition, Labs, Medication, When to see the doctor  Labs Reviewed --  [TSH]  Nutrition Interventions   Nutrition Discussed/Reviewed Nutrition Discussed, Nutrition Reviewed, Decreasing sugar intake, Portion sizes          SDOH assessments and interventions completed:  No     Care Coordination  Interventions:  Yes, provided   Follow up plan: Follow up call scheduled for 11/08/23 @09 :30 AM    Encounter Outcome:  Pt. Visit Completed

## 2023-06-07 NOTE — Patient Instructions (Signed)
Visit Information  Thank you for taking time to visit with me today. Please don't hesitate to contact me if I can be of assistance to you.   Following are the goals we discussed today:   Goals Addressed               This Visit's Progress     Patient Stated     COMPLETED: I am cutting back on my carbs (pt-stated)        Care Coordination Interventions: Evaluation of current treatment plan related to diabetes and patient's adherence to plan as established by provider Review of patient status, including review of consultant's reports, relevant laboratory and other test results, and medications completed Counseled on Diabetic diet, portion control, avoiding sugary foods/drinks, determined patient looks at labels and strictly limits carbohydrates Positive reinforcement given to patient for making efforts to improve her diabetes and overall health  Lab Results  Component Value Date   HGBA1C 5.6 06/05/2023         Our next appointment is by telephone on 11/08/23 at 09:30 AM  Please call the care guide team at [phone] if you need to cancel or reschedule your appointment.   If you are experiencing a Mental Health or Behavioral Health Crisis or need someone to talk to, please call 1-800-273-TALK (toll free, 24 hour hotline)  Patient verbalizes understanding of instructions and care plan provided today and agrees to view in MyChart. Active MyChart status and patient understanding of how to access instructions and care plan via MyChart confirmed with patient.     Delsa Sale, RN, BSN, CCM Care Management Coordinator [facility] Care Management Direct Phone: [phone]

## 2023-06-14 ENCOUNTER — Other Ambulatory Visit: Payer: Self-pay | Admitting: Family Medicine

## 2023-06-14 DIAGNOSIS — E1169 Type 2 diabetes mellitus with other specified complication: Secondary | ICD-10-CM

## 2023-06-14 NOTE — Telephone Encounter (Signed)
This was just refilled 

## 2023-07-02 ENCOUNTER — Other Ambulatory Visit: Payer: Self-pay | Admitting: Family Medicine

## 2023-07-02 DIAGNOSIS — E118 Type 2 diabetes mellitus with unspecified complications: Secondary | ICD-10-CM

## 2023-07-12 DIAGNOSIS — E039 Hypothyroidism, unspecified: Secondary | ICD-10-CM | POA: Diagnosis not present

## 2023-07-12 DIAGNOSIS — Z9289 Personal history of other medical treatment: Secondary | ICD-10-CM | POA: Diagnosis not present

## 2023-07-12 DIAGNOSIS — C439 Malignant melanoma of skin, unspecified: Secondary | ICD-10-CM | POA: Diagnosis not present

## 2023-07-12 DIAGNOSIS — C4371 Malignant melanoma of right lower limb, including hip: Secondary | ICD-10-CM | POA: Diagnosis not present

## 2023-07-12 DIAGNOSIS — C779 Secondary and unspecified malignant neoplasm of lymph node, unspecified: Secondary | ICD-10-CM | POA: Diagnosis not present

## 2023-08-17 ENCOUNTER — Other Ambulatory Visit: Payer: Self-pay | Admitting: Family Medicine

## 2023-08-17 DIAGNOSIS — Z5181 Encounter for therapeutic drug level monitoring: Secondary | ICD-10-CM

## 2023-08-25 ENCOUNTER — Other Ambulatory Visit: Payer: Self-pay | Admitting: Family Medicine

## 2023-08-25 DIAGNOSIS — R809 Proteinuria, unspecified: Secondary | ICD-10-CM

## 2023-08-27 DIAGNOSIS — Z23 Encounter for immunization: Secondary | ICD-10-CM | POA: Diagnosis not present

## 2023-09-10 ENCOUNTER — Other Ambulatory Visit: Payer: Self-pay | Admitting: Family Medicine

## 2023-09-10 DIAGNOSIS — E118 Type 2 diabetes mellitus with unspecified complications: Secondary | ICD-10-CM

## 2023-09-17 ENCOUNTER — Other Ambulatory Visit: Payer: Self-pay | Admitting: Family Medicine

## 2023-09-17 DIAGNOSIS — E7800 Pure hypercholesterolemia, unspecified: Secondary | ICD-10-CM

## 2023-09-18 ENCOUNTER — Other Ambulatory Visit: Payer: Self-pay | Admitting: Family Medicine

## 2023-09-18 DIAGNOSIS — R809 Proteinuria, unspecified: Secondary | ICD-10-CM

## 2023-09-30 ENCOUNTER — Ambulatory Visit (INDEPENDENT_AMBULATORY_CARE_PROVIDER_SITE_OTHER): Payer: Medicare Other | Admitting: Podiatry

## 2023-09-30 ENCOUNTER — Encounter: Payer: Self-pay | Admitting: Podiatry

## 2023-09-30 VITALS — Ht 59.0 in | Wt 131.2 lb

## 2023-09-30 DIAGNOSIS — M79675 Pain in left toe(s): Secondary | ICD-10-CM

## 2023-09-30 DIAGNOSIS — M79674 Pain in right toe(s): Secondary | ICD-10-CM

## 2023-09-30 DIAGNOSIS — B351 Tinea unguium: Secondary | ICD-10-CM

## 2023-09-30 MED ORDER — CICLOPIROX 8 % EX SOLN: Freq: Every day | CUTANEOUS | 11 refills | Status: DC

## 2023-09-30 NOTE — Progress Notes (Signed)
Subjective:  Patient ID: Tonya Rogers, female    DOB: 01-22-1936,  MRN: [id number]  Tonya Rogers presents to clinic today for:  Chief Complaint  Patient presents with   Nail Problem    [facility]   Patient notes nails are thick, discolored, elongated and painful in shoegear when trying to ambulate.  Notes his right great toenail and left 2nd toenail are the thickest and most involved with fungus.  He'd like the Rx ciclopirox renewed to [facility].  PCP is Joselyn Arrow, MD.  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 Althea Charon   Osteoporosis    Pulmonary nodules    small, seen on CT 12/09   Pure hypercholesterolemia     Past Surgical History:  Procedure Laterality Date   CATARACT EXTRACTION, BILATERAL  01/2011   excision of melanoma  11/23/08   wide excision of melanoma and R inguinal sentinel LN mapping and  biopsy   excision of recurrent melanoma  2011, 06/2014, 02/2018   RLE   TONSILLECTOMY AND ADENOIDECTOMY     TYMPANOSTOMY TUBE PLACEMENT  2013   L ear  (Dr. Suszanne Conners)   No Known Allergies  Review of Systems: Negative except as noted in the HPI.  Objective:  Tonya Rogers is a pleasant 87 y.o. female in NAD. AAO x 3.  Vascular Examination: Capillary refill time is 3-5 seconds to toes bilateral. Palpable pedal pulses b/l LE. Digital hair present b/l.  Skin temperature gradient  WNL b/l. No varicosities b/l. No cyanosis noted b/l.   Dermatological Examination: Pedal skin with normal turgor, texture and tone b/l. No open wounds. No interdigital macerations b/l. Toenails x10 are 3-23mm thick, discolored, dystrophic with subungual debris. There is pain with compression of the nail plates.  They are elongated x10     Latest Ref Rng & Units 06/05/2023   10:56 AM 01/30/2023   11:52 AM 10/22/2022    2:04 PM  Hemoglobin A1C  Hemoglobin-A1c 4.0 - 5.6 % 5.6  5.7  5.3    Assessment/Plan: 1. Pain due to onychomycosis of toenails of both feet     Meds ordered this encounter  Medications   ciclopirox (PENLAC) 8 % solution    Sig: Apply topically at bedtime. Apply over nail. Apply daily over previous coat. Remove weekly with polish remover.    Dispense:  6.6 mL    Refill:  11   The mycotic toenails were sharply debrided x10 with sterile nail nippers and a power debriding burr to decrease bulk/thickness and length.    His ciclopirox lacquer was renewed today.  Continue with every day application to the toenails.  Informed the patient that, upon burring the old lacquer and thickness from the nails today, they are showing moderate improvement from the topical medication.    Return in about 3  months (around 12/31/2023) for [facility].   Clerance Lav, DPM, FACFAS [facility]     [address] [place], [place] [address]                Office [phone]  Fax [phone]

## 2023-10-23 ENCOUNTER — Telehealth: Payer: Self-pay | Admitting: Family Medicine

## 2023-10-23 ENCOUNTER — Telehealth: Payer: Self-pay | Admitting: *Deleted

## 2023-10-23 DIAGNOSIS — R809 Proteinuria, unspecified: Secondary | ICD-10-CM

## 2023-10-23 DIAGNOSIS — E1169 Type 2 diabetes mellitus with other specified complication: Secondary | ICD-10-CM

## 2023-10-23 NOTE — Telephone Encounter (Signed)
Orders entered

## 2023-10-23 NOTE — Telephone Encounter (Signed)
Patient has AWV on 11/06/23 @ 3:00 would like to come for lab visit of labs are needed. Can you let me know and I will call her back and schedule her. Thanks.

## 2023-10-23 NOTE — Telephone Encounter (Signed)
Pt wants to come in to do blood work before their wellness appointment on 11/07/23. Is this ok?

## 2023-10-28 NOTE — Telephone Encounter (Signed)
Lab orders were entered. Please schedule for lab visit

## 2023-10-30 ENCOUNTER — Other Ambulatory Visit: Payer: Self-pay | Admitting: *Deleted

## 2023-10-30 DIAGNOSIS — E039 Hypothyroidism, unspecified: Secondary | ICD-10-CM

## 2023-10-30 MED ORDER — LEVOTHYROXINE SODIUM 75 MCG PO TABS: ORAL_TABLET | ORAL | 1 refills | Status: DC

## 2023-11-01 DIAGNOSIS — C439 Malignant melanoma of skin, unspecified: Secondary | ICD-10-CM | POA: Diagnosis not present

## 2023-11-01 DIAGNOSIS — C779 Secondary and unspecified malignant neoplasm of lymph node, unspecified: Secondary | ICD-10-CM | POA: Diagnosis not present

## 2023-11-04 ENCOUNTER — Other Ambulatory Visit: Payer: Medicare Other

## 2023-11-05 ENCOUNTER — Other Ambulatory Visit: Payer: Medicare Other

## 2023-11-05 DIAGNOSIS — R809 Proteinuria, unspecified: Secondary | ICD-10-CM

## 2023-11-05 DIAGNOSIS — E1169 Type 2 diabetes mellitus with other specified complication: Secondary | ICD-10-CM

## 2023-11-05 NOTE — Patient Instructions (Incomplete)
  HEALTH MAINTENANCE RECOMMENDATIONS:  It is recommended that you get at least 30 minutes of aerobic exercise at least 5 days/week (for weight loss, you may need as much as 60-90 minutes). This can be any activity that gets your heart rate up. This can be divided in 10-15 minute intervals if needed, but try and build up your endurance at least once a week.  Weight bearing exercise is also recommended twice weekly.  Eat a healthy diet with lots of vegetables, fruits and fiber.  "Colorful" foods have a lot of vitamins (ie green vegetables, tomatoes, red peppers, etc).  Limit sweet tea, regular sodas and alcoholic beverages, all of which has a lot of calories and sugar.  Up to 1 alcoholic drink daily may be beneficial for women (unless trying to lose weight, watch sugars).  Drink a lot of water.  Calcium recommendations are 1200-1500 mg daily (1500 mg for postmenopausal women or women without ovaries), and vitamin D 1000 IU daily.  This should be obtained from diet and/or supplements (vitamins), and calcium should not be taken all at once, but in divided doses.  Monthly self breast exams and yearly mammograms for women over the age of 32 is recommended.  Sunscreen of at least SPF 30 should be used on all sun-exposed parts of the skin when outside between the hours of 10 am and 4 pm (not just when at beach or pool, but even with exercise, golf, tennis, and yard work!)  Use a sunscreen that says "broad spectrum" so it covers both UVA and UVB rays, and make sure to reapply every 1-2 hours.  Remember to change the batteries in your smoke detectors when changing your clock times in the spring and fall. Carbon monoxide detectors are recommended for your home.  Use your seat belt every time you are in a car, and please drive safely and not be distracted with cell phones and texting while driving.   Tonya Rogers , Thank you for taking time to come for your Medicare Wellness Visit. I appreciate your ongoing  commitment to your health goals. Please review the following plan we discussed and let me know if I can assist you in the future.   This is a list of the screening recommended for you and due dates:  Health Maintenance  Topic Date Due   COVID-19 Vaccine (7 - 2023-24 season) 07/28/2023   Hemoglobin A1C  12/06/2023   Yearly kidney health urinalysis for diabetes  01/30/2024   Complete foot exam   01/30/2024   Yearly kidney function blood test for diabetes  02/01/2024   Eye exam for diabetics  04/09/2024   Medicare Annual Wellness Visit  11/05/2024   DTaP/Tdap/Td vaccine (4 - Td or Tdap) 11/29/2027   Pneumonia Vaccine  Completed   Flu Shot  Completed   DEXA scan (bone density measurement)  Completed   Zoster (Shingles) Vaccine  Completed   HPV Vaccine  Aged Out   PM medications are not recommended. If you are having pain in your hands at night, I recommend taking Tylenol Arthritis (this is extended release, so that you don't wake up in the middle of the night with pain).

## 2023-11-05 NOTE — Progress Notes (Unsigned)
No chief complaint on file.  Tonya Rogers is a 87 y.o. female who presents for annual wellness visit and follow-up on chronic medical conditions See below for labs done prior to her visit.  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 She continues regular care at [facility], saw Dr. Lenis Noon last week. Currently off systemic therapy. She received TVEC therapy and achieved a clinical complete response to TVEC. No signs of recurrence clinically.  Plan is continued surveillance, with PET/CT in 12/2023, along with labs and visit with Dr. Burman Freestone (last seen in 06/2023, see labs below). Last PET/CT was 03/2023: CONCLUSION:  1. Minimal non FDG-avid stranding in the anterior right thigh in the region of a prior hypermetabolic nodule.  2.  No new sites of FDG avid disease.   ANCILLARY CT FINDINGS:  Evaluation of the lower thighs and legs reveals no additional contributory findings.  Coronary artery, mitral annular, and aortic valvular calcifications.  Nonobstructing bilateral nephrolithiasis.  Colonic diverticulosis.  Chronic gallbladder distention.  Aortobiiliac atherosclerosis.    Hypothyroidism:  She continues to take 75 mcg 6 days/week (none on Sunday).  TSH has been normal on this dose (last checked July here, August by oncologist). Energy is better since completing chemo. Her nails grow well, but break easily.  Moods are good.  Hair loss is chronic (previously given referral for wig by Dr. Lenis Noon). Bowels ***UPDATE--mild constipation? (Chronic)  Lab Results  Component Value Date   TSH 3.810 06/05/2023    DM: Doing well on the Bydureon B-cise, pioglitazone and metformin (dose cut to 1/2 tablet last year d/t diarrhea).  Last A1c was 5.6% in July 2024   Sugars are not checked often, running    Denies hypoglycemia, polydipsia or polyuria.  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 Not painful or burning. ***UPDATE Last eye exam was  03/2023, no retinopathy. She checks her feet regularly, no sores/concerns. She saw podiatrist in 09/2023, using Penlac for onychomycosis. She has microalbuminuria (ratio elevated at 145 in 01/2023).  She is taking losartan 25mg . Repeat ratio is pending.  Bydureon B-cise will not be available much longer, and will need to have medication transitioned to a different GLP.   Hyperlipidemia--She is compliant with her medications, taking 20mg  of simvastatin and 3000mg  of fish oil. Denies any side effects to the medications, and is following a lowfat, low cholesterol diet.  Lipids were at goal on last check:   Lab Results  Component Value Date   CHOL 167 02/07/2023   HDL 89 02/07/2023   LDLCALC 65 02/07/2023   TRIG 69 02/07/2023   CHOLHDL 1.9 02/07/2023    Hypertension follow-up:  She previously took losartan 100mg . This was stopped after hospitalization for GI bleed, when BP's were low. It was restarted at 25mg  (microalbuminuria). She limits the sodium in her diet. She doesn't monitor BP at home, but reports normal values at other doctors. Denies dizziness, headaches, chest pain, DOE, edema.  BP Readings from Last 3 Encounters:  06/05/23 132/70  01/30/23 130/70  10/29/22 139/72    Osteoporosis:  She took fosamax x 5 years, stopped in 2011 due to jaw complications (jaw bones were "breaking apart"). She continues to take regular calcium, Vitamin D. She had repeat DEXA in 01/2023, which showed improvement in her wrist, still had bilateral osteopenia at hips.    Immunization History  Administered Date(s) Administered   Fluad Quad(high Dose 65+) 07/20/2019, 08/29/2022   Influenza Split 08/26/2013, 09/08/2014, 08/17/2015  Influenza, High Dose Seasonal PF 09/12/2017, 08/06/2018, 08/13/2020, 08/18/2021   Influenza-Unspecified 08/20/2016   PFIZER Comirnaty(Gray Top)Covid-19 Tri-Sucrose Vaccine 03/28/2021   PFIZER(Purple Top)SARS-COV-2 Vaccination 01/07/2020, 02/01/2020, 09/13/2020   Pfizer  Covid-19 Vaccine Bivalent Booster 3yrs & up 10/09/2021   Pfizer(Comirnaty)Fall Seasonal Vaccine 12 years and older 09/12/2022   Pneumococcal Conjugate-13 08/26/2014   Pneumococcal Polysaccharide-23 12/27/2000, 07/27/2008, 04/03/2018   Td 03/26/2005, 11/28/2017   Tdap 03/05/2012   Zoster Recombinant(Shingrix) 02/11/2018, 08/06/2018   Zoster, Live 02/25/2011   Last Pap smear: 10/2013, negative, no high risk HPV present Last mammogram: 01/2023 (required diagnostic on R in 02/2023, benign calcifications) Last colonoscopy: 02/2022 when she had GI bleed--AVM, erosions (negative Cologard 01/2016 and 02/2020) Last DEXA: 01/2023 T-2.2 R fem neck, -2.0 L fem neck Dentist: every 6 months Ophtho: yearly  Exercise:  Walks in her house, and in the parking lot.  15-30 minutes 4-5 days/week. Weights for hands and feet, every other day.   Patient Care Team: Joselyn Arrow, MD as PCP - General (Family Medicine) Cyndee Brightly, Verdene Lennert, DMD (Dentistry) Jethro Bolus, MD as Consulting Physician (Ophthalmology) Newman Pies, MD as Consulting Physician (Otolaryngology) Cherlyn Roberts, MD as Referring Physician (Dermatology) Gastroenterology, Lenise Herald, Ivar Drape (Hematology and Oncology) Tracie Harrier, MD as Referring Physician (Surgical Oncology) Riley Churches, RN as [facility] Care Management Local Dermatologist:  Dr. Wallace Cullens at [facility]: [facility]. Jola Babinski at [facility] did last EGD/colonoscopy during 02/2022 hospitalization  Podiatrist: Dr. Burna Mortimer  ([facility])  Depression Screening: Flowsheet Row Office Visit from 10/29/2022 in [facility]  PHQ-2 Total Score 0        Falls screen:     01/30/2023   11:40 AM 10/29/2022    1:18 PM 02/28/2022    2:17 PM 10/11/2021   11:32 AM 03/01/2021    3:12 PM  Fall Risk   Falls in the past year? 0 0 0 0 0  Number falls in past yr: 0 0 0 0 0  Injury with Fall? 0 0 0 0 0  Risk for fall due to : No Fall Risks No Fall Risks No Fall Risks  No Fall Risks No Fall Risks  Follow up Falls evaluation completed Falls evaluation completed Falls evaluation completed Falls evaluation completed Falls evaluation completed     Functional Status Survey:        Mini-COG    May 2023-- new MOST form and DNR form completed.  Copies of living will and healthcare power of attorney are scanned into chart.  PMH, PSH, SH and FH reviewed and updated   ROS:  The patient denies anorexia, fever, headaches, vision changes, ear pain, sore throat, breast concerns, chest pain, palpitations, dizziness, syncope, dyspnea on exertion, cough, swelling, nausea, vomiting, abdominal pain, melena, hematochezia, indigestion/heartburn, hematuria, incontinence (just with cough/sneeze, and some with urge), dysuria,vaginal bleeding, discharge, odor or itch, genital lesions, joint pains, weakness, tremor, suspicious skin lesions, depression, anxiety, abnormal bleeding/bruising (mild, chronic), or enlarged lymph nodes.  Hearing loss bilaterally. Has hearing aids--not using them; doesn't find them all that helpful. Sees Dr. Suszanne Conners yearly. +tingling in both hands, L>R, no longer in her feet. Dry mouth and dry skin Up 2-3x/night to void.  Slight short-term memory issues (names), nothing that has progressed.   PHYSICAL EXAM:  There were no vitals taken for this visit.  Wt Readings from Last 3 Encounters:  09/30/23 131 lb 3.2 oz (59.5 kg)  06/05/23 131 lb 3.2 oz (59.5 kg)  01/30/23 125 lb 3.2  oz (56.8 kg)   Elderly female, in no distress. Hard of hearing.  HEENT: conjunctiva and sclera are clear, EOMI. OP clear.  Significant alopecia across the entire top of her head.  Skin on scalp appears normal. ***TM's?? Neck: no lymphadenopathy, thyromegaly or mass, no carotid bruit Heart: regular rate and rhythm, no murmur Lungs: clear bilaterally Extremities: no edema. Very thickened, discolored, toenails x 10. Neuro: alert and oriented, cranial nerves grossly intact.    Skin: normal turgor, no rashes. Multiple hyperpigmented and slightly bluish focal skin discoloration along the R lower leg*** Neuro: alert and oriented, cranial nerves grossly intact. Psych: normal mood, affect, hygiene and grooming.  ***UPDATE if TM's examined Update toenails, SKIN RLE Gait?  ***DIABETIC FOOT EXAM  Lab Results  Component Value Date   HGBA1C 5.6 06/05/2023     Chemistry      Component Value Date/Time   NA 131 (L) 04/05/2022 1247   K 4.6 04/05/2022 1247   CL 89 (L) 04/05/2022 1247   CO2 22 04/05/2022 1247   BUN 19 04/05/2022 1247   CREATININE 0.9 02/01/2023 0000   CREATININE 1.02 (H) 04/05/2022 1247   CREATININE 0.99 (H) 09/16/2017 1004      Component Value Date/Time   CALCIUM 9.7 04/05/2022 1247   CALCIUM 9.7 04/23/2012 0915   ALKPHOS 51 04/05/2022 1247   AST 57 (H) 04/05/2022 1247   ALT 33 (H) 04/05/2022 1247   BILITOT <0.2 04/05/2022 1247     Fasting glucose  Urine microalbumin/Cr ratio pending.  WF labs reviewed, last checked 06/2023: Normal CBC C-met in 06/2023 notable for Na 130 Normal LDH TSH 3.532  ASSESSMENT/PLAN:  Did she get flu, COVID, RSV?? Also can get Prevnar-20 (today if not taking 2 other vaccines, or at next visit if taking flu/COVID today)  Doesn't look like I've done breast or pelvics in the past. Does she usually decline these? Doesn't have GYN that I'm aware of, right? Fine if she declines--I just want to document that it was offered and declined. Happy to do breast exam if she is willing to do that, and skip pelvic if she prefers to skip that at 87, if not having any symptoms or problems.  DIABETIC FOOT EXAM (due in March, may not need to see her then, so will do now)  Discussed monthly self breast exams and yearly mammograms (up to her if she wants to continue vs stop screening mammograms, discussed); at least 30 minutes of aerobic activity at least 5 days/week and weight-bearing exercise 2x/week; proper sunscreen use  reviewed; healthy diet, including goals of calcium and vitamin D intake and alcohol recommendations (less than or equal to 1 drink/day) reviewed; regular seatbelt use; changing batteries in smoke detectors.  Immunization recommendations discussed--continue yearly flu shots.  RSV vaccine recommended, to get from pharmacy.   Prevnar-20  Colonoscopy recommendations reviewed, UTD.  Consider repeat if anemia issues.  Pap smears not needed due to age and low risk.  MOST form reviewed from 03/2022, DNR, limited interventions (no ICU), IV fluids and TF for a defined trial period.   Medicare Attestation I have personally reviewed: The patient's medical and social history Their use of alcohol, tobacco or illicit drugs Their current medications and supplements The patient's functional ability including ADLs,fall risks, home safety risks, cognitive, and hearing and visual impairment Diet and physical activities Evidence for depression or mood disorders  The patient's weight, height, BMI have been recorded in the chart.  I have made referrals, counseling, and provided education  to the patient based on review of the above and I have provided the patient with a written personalized care plan for preventive services.     Lavonda Jumbo, MD

## 2023-11-06 ENCOUNTER — Encounter: Payer: Self-pay | Admitting: Family Medicine

## 2023-11-06 ENCOUNTER — Ambulatory Visit: Payer: Medicare Other | Admitting: Family Medicine

## 2023-11-06 VITALS — BP 128/78 | HR 76 | Ht 59.0 in | Wt 134.0 lb

## 2023-11-06 DIAGNOSIS — M8580 Other specified disorders of bone density and structure, unspecified site: Secondary | ICD-10-CM

## 2023-11-06 DIAGNOSIS — I152 Hypertension secondary to endocrine disorders: Secondary | ICD-10-CM | POA: Diagnosis not present

## 2023-11-06 DIAGNOSIS — Z23 Encounter for immunization: Secondary | ICD-10-CM | POA: Diagnosis not present

## 2023-11-06 DIAGNOSIS — C779 Secondary and unspecified malignant neoplasm of lymph node, unspecified: Secondary | ICD-10-CM

## 2023-11-06 DIAGNOSIS — E039 Hypothyroidism, unspecified: Secondary | ICD-10-CM

## 2023-11-06 DIAGNOSIS — E871 Hypo-osmolality and hyponatremia: Secondary | ICD-10-CM

## 2023-11-06 DIAGNOSIS — R809 Proteinuria, unspecified: Secondary | ICD-10-CM | POA: Diagnosis not present

## 2023-11-06 DIAGNOSIS — C439 Malignant melanoma of skin, unspecified: Secondary | ICD-10-CM

## 2023-11-06 DIAGNOSIS — E1169 Type 2 diabetes mellitus with other specified complication: Secondary | ICD-10-CM | POA: Diagnosis not present

## 2023-11-06 DIAGNOSIS — E1159 Type 2 diabetes mellitus with other circulatory complications: Secondary | ICD-10-CM

## 2023-11-06 DIAGNOSIS — I700 Atherosclerosis of aorta: Secondary | ICD-10-CM

## 2023-11-06 DIAGNOSIS — Z0000 Encounter for general adult medical examination without abnormal findings: Secondary | ICD-10-CM | POA: Diagnosis not present

## 2023-11-06 MED ORDER — PIOGLITAZONE HCL 15 MG PO TABS: 15.0000 mg | ORAL_TABLET | Freq: Every day | ORAL | 1 refills | Status: DC

## 2023-11-07 LAB — COMPREHENSIVE METABOLIC PANEL
ALT: 13 [IU]/L (ref 0–32)
AST: 25 [IU]/L (ref 0–40)
Albumin: 4.8 g/dL — ABNORMAL HIGH (ref 3.7–4.7)
Alkaline Phosphatase: 73 [IU]/L (ref 44–121)
BUN/Creatinine Ratio: 16 (ref 12–28)
BUN: 14 mg/dL (ref 8–27)
Bilirubin Total: 0.4 mg/dL (ref 0.0–1.2)
CO2: 26 mmol/L (ref 20–29)
Calcium: 10.8 mg/dL — ABNORMAL HIGH (ref 8.7–10.3)
Chloride: 92 mmol/L — ABNORMAL LOW (ref 96–106)
Creatinine, Ser: 0.87 mg/dL (ref 0.57–1.00)
Globulin, Total: 2.7 g/dL (ref 1.5–4.5)
Glucose: 102 mg/dL — ABNORMAL HIGH (ref 70–99)
Potassium: 4.7 mmol/L (ref 3.5–5.2)
Sodium: 133 mmol/L — ABNORMAL LOW (ref 134–144)
Total Protein: 7.5 g/dL (ref 6.0–8.5)
eGFR: 65 mL/min/{1.73_m2} (ref >59)

## 2023-11-07 LAB — MICROALBUMIN / CREATININE URINE RATIO
Creatinine, Urine: 22.9 mg/dL
Microalb/Creat Ratio: 588 mg/g{creat} — ABNORMAL HIGH (ref 0–29)
Microalbumin, Urine: 134.6 ug/mL

## 2023-11-07 LAB — HEMOGLOBIN A1C
Est. average glucose Bld gHb Est-mCnc: 117 mg/dL
Hgb A1c MFr Bld: 5.7 % — ABNORMAL HIGH (ref 4.8–5.6)

## 2023-11-08 ENCOUNTER — Ambulatory Visit: Payer: Self-pay

## 2023-11-08 NOTE — Patient Outreach (Signed)
  Care Coordination   Follow Up Visit Note   11/08/2023 Name: Tonya Rogers MRN: [id number] DOB: 1936/05/03  Tonya Rogers is a 87 y.o. year old female who sees Tonya Arrow, MD for primary care. I spoke with  Marisa Sprinkles by phone today.  What matters to the patients health and wellness today?  Patient would like to stay healthy and continue to live independently.     Goals Addressed             This Visit's Progress    To continue to stay healthy and live independently       Care Coordination Interventions: Provided education to patient about basic DM disease process Reviewed medications with patient and discussed importance of medication adherence Review of patient status, including review of consultants reports, relevant laboratory and other test results, and medications completed Counseled on Diabetic diet, my plate method, 409 minutes of moderate intensity exercise/week Lab Results  Component Value Date   HGBA1C 5.7 (H) 11/05/2023     Interventions Today    Flowsheet Row Most Recent Value  Chronic Disease   Chronic disease during today's visit Diabetes, Other  [s/p melanoma]  General Interventions   General Interventions Discussed/Reviewed General Interventions Discussed, General Interventions Reviewed, Labs, Doctor Visits  Doctor Visits Discussed/Reviewed Doctor Visits Discussed, Doctor Visits Reviewed, PCP, Specialist  Exercise Interventions   Exercise Discussed/Reviewed Physical Activity, Exercise Reviewed, Exercise Discussed  Physical Activity Discussed/Reviewed Physical Activity Reviewed, Physical Activity Discussed, Types of exercise  Education Interventions   Education Provided Provided Education  Provided Verbal Education On Nutrition, Labs, Medication, When to see the doctor  Labs Reviewed Hgb A1c, Kidney Function  Nutrition Interventions   Nutrition Discussed/Reviewed Nutrition Discussed, Nutrition Reviewed, Carbohydrate meal planning, Portion  sizes  Pharmacy Interventions   Pharmacy Dicussed/Reviewed Pharmacy Topics Discussed, Pharmacy Topics Reviewed, Medications and their functions          SDOH assessments and interventions completed:  Yes  SDOH Interventions Today    Flowsheet Row Most Recent Value  SDOH Interventions   Food Insecurity Interventions Intervention Not Indicated  Housing Interventions Intervention Not Indicated  Transportation Interventions Intervention Not Indicated  Utilities Interventions Intervention Not Indicated  Physical Activity Interventions Intervention Not Indicated        Care Coordination Interventions:  Yes, provided   Follow up plan: Follow up call scheduled for 01/09/24 @10 :30 AM    Encounter Outcome:  Patient Visit Completed

## 2023-11-08 NOTE — Addendum Note (Signed)
Addended by: Sherrill Raring on: 11/08/2023 09:04 AM   Modules accepted: Orders

## 2023-11-08 NOTE — Patient Instructions (Signed)
Visit Information  Thank you for taking time to visit with me today. Please don't hesitate to contact me if I can be of assistance to you.   Following are the goals we discussed today:   Goals Addressed             This Visit's Progress    To continue to stay healthy and live independently       Care Coordination Interventions: Provided education to patient about basic DM disease process Reviewed medications with patient and discussed importance of medication adherence Review of patient status, including review of consultants reports, relevant laboratory and other test results, and medications completed Counseled on Diabetic diet, my plate method, 161 minutes of moderate intensity exercise/week Lab Results  Component Value Date   HGBA1C 5.7 (H) 11/05/2023          Our next appointment is by telephone on 01/09/24 at 10:30 AM  Please call the care guide team at [phone] if you need to cancel or reschedule your appointment.   If you are experiencing a Mental Health or Behavioral Health Crisis or need someone to talk to, please call 1-800-273-TALK (toll free, 24 hour hotline)  Patient verbalizes understanding of instructions and care plan provided today and agrees to view in MyChart. Active MyChart status and patient understanding of how to access instructions and care plan via MyChart confirmed with patient.     Delsa Sale RN BSN CCM [place]  [facility] Health Nurse Care Coordinator  Direct Dial: [phone] Website: [email or web address]

## 2023-11-11 ENCOUNTER — Telehealth: Payer: Self-pay

## 2023-11-11 NOTE — Progress Notes (Signed)
   Care Guide Note  11/11/2023 Name: Tonya Rogers MRN: [id number] DOB: Sep 22, 1936  Referred by: Joselyn Arrow, MD Reason for referral : Care Coordination (Outreach to schedule with Pharm d )   Tonya Rogers is a 87 y.o. year old female who is a primary care patient of Joselyn Arrow, MD. Tonya Rogers was referred to the pharmacist for assistance related to DM.    Successful contact was made with the patient to discuss pharmacy services including being ready for the pharmacist to call at least 5 minutes before the scheduled appointment time, to have medication bottles and any blood sugar or blood pressure readings ready for review. The patient agreed to meet with the pharmacist via with the pharmacist via telephone visit on (date/time).  11/12/2023  [name] , RMA     [place]  [facility], [facility] Guide  Direct Dial: [phone]  Website: [email or web address]

## 2023-11-12 ENCOUNTER — Other Ambulatory Visit: Payer: Medicare Other

## 2023-11-12 NOTE — Progress Notes (Signed)
11/12/2023 Name: Tonya Rogers MRN: [id number] DOB: 10-12-1936  Chief Complaint  Patient presents with   Diabetes   Medication Assistance   Medication Management    Tonya Rogers is a 87 y.o. year old female who presented for a telephone visit.   They were referred to the pharmacist by their PCP for assistance in managing diabetes and complex medication management. And ckd.    Subjective:  Care Team: Primary Care Provider: Joselyn Arrow, MD   Medication Access/Adherence  Current Pharmacy:  [facility] [phone] [place], [place] - [address] AT [facility] [address] & [facility] [address] [place] [address] Phone: [phone] Fax: [phone]  [facility] - [place], [place] - [address] [place] [address] Phone: [phone] Fax: [phone]  [facility] [address], [place] - [address] [place] [address] Phone: [phone] Fax: [phone]  [facility] - [place], [place] - [address] [place] [address] Phone: [phone] Fax: [phone]   Patient reports affordability concerns with their medications: No  Patient reports access/transportation concerns to their pharmacy: No  Patient reports adherence concerns with their medications:  No     Diabetes:  Current medications: Bydureon, Metformin 500mg  1 BID, Actos 15mg  Medications tried in the past: None  Current glucose readings: None to report, checks on rare occasion and reports those are within goal   Observed patterns:  Patient denies hypoglycemic s/sx including dizziness, shakiness, sweating. Patient denies hyperglycemic symptoms including polyuria, polydipsia, polyphagia, nocturia, neuropathy, blurred vision.   Current medication access support: None, but bydureon is expected to be removed from the market  soon    Objective:  Lab Results  Component Value Date   HGBA1C 5.7 (H) 11/05/2023    Lab Results  Component Value Date   CREATININE 0.87 11/05/2023   BUN 14 11/05/2023   NA 133 (L) 11/05/2023   K 4.7 11/05/2023   CL 92 (L) 11/05/2023   CO2 26 11/05/2023    Lab Results  Component Value Date   CHOL 167 02/07/2023   HDL 89 02/07/2023   LDLCALC 65 02/07/2023   TRIG 69 02/07/2023   CHOLHDL 1.9 02/07/2023    Medications Reviewed Today     Reviewed by Sherrill Raring, RPH (Pharmacist) on 11/12/23 at 1310  Med List Status: <None>   Medication Order Taking? Sig Documenting Provider Last Dose Status Informant  acetaminophen (TYLENOL) 650 MG CR tablet [id number] Yes Take 650 mg by mouth every 8 (eight) hours as needed for pain. [provider] Taking Active   ALPHA LIPOIC ACID PO [id number] Yes Take 1 capsule by mouth daily. 600mg  [provider] Taking Active   BIOTIN PO [id number] Yes Take 1 tablet by mouth daily. [provider] Taking Active            Med Note Colon Branch Nov 06, 2023  3:16 PM)    Calcium Carb-Cholecalciferol (CALCIUM 600+D3) 600-20 MG-MCG TABS [id number] Yes Take 1 tablet by mouth daily. [provider] Taking Active   Cholecalciferol (VITAMIN D) 1000 UNITS capsule [id number] Yes Take 1,000 Units by mouth daily. [provider] Taking Active   Chromium-Cinnamon [phone] MCG-MG CAPS [id number] Yes Take 1 capsule by mouth 2 (two) times daily. [provider] Taking Active   Coenzyme Q10 (CO Q 10) 100 MG CAPS [id number] Yes Take 1 capsule by mouth  daily. [provider] Taking Active   CRANBERRY PO [id number] Yes Take 1 capsule by mouth daily. [provider] Taking Active   cyanocobalamin (VITAMIN B12) 1000 MCG tablet [id number] Yes Take 1,000 mcg by mouth daily. [provider] Taking Active   docusate sodium (COLACE) 100 MG capsule [id number] Yes Take 100 mg by mouth 2 (two)  times daily. [provider] Taking Active            Med Note Colon Branch Nov 06, 2023  3:17 PM) As needed  Exenatide ER (BYDUREON BCISE) 2 MG/0.85ML Virl Cagey [id number] Yes INJECT 1 PEN  SUBCUTANEOUSLY ONCE A Carlyon Prows, MD Taking Active   fish oil-omega-3 fatty acids 1000 MG capsule [id number] Yes Take 3 g by mouth daily. [provider] Taking Active   glucose blood test strip [id number]  1 each by Other route 2 (two) times daily. Test twice daily Joselyn Arrow, MD  Active   levothyroxine (SYNTHROID) 75 MCG tablet [id number] Yes TAKE 1 TABLET DAILY FOR 6 DAYS A WEEK, SKIP SUNDAYS FOR HYPOTHYROIDISM. TAKE 30 MINUTES BEFORE EATING AS DIRECTED. Joselyn Arrow, MD Taking Active   losartan (COZAAR) 25 MG tablet [id number] Yes TAKE 1 TABLET(25 MG) BY MOUTH DAILY Joselyn Arrow, MD Taking Active   Melatonin 10 MG TABS [id number] Yes Take 1 tablet by mouth at bedtime. [provider] Taking Active   metFORMIN (GLUCOPHAGE) 500 MG tablet [id number] Yes TAKE 1 TABLET BY MOUTH TWICE  DAILY WITH MEALS Joselyn Arrow, MD Taking Active   Multiple Vitamins-Minerals (EYE VITAMINS PO) [id number] Yes Take 1 capsule by mouth daily. [provider] Taking Active   Multiple Vitamins-Minerals (MULTIVITAMIN WITH MINERALS) tablet [id number] Yes Take 1 tablet by mouth daily. [provider] Taking Active   pioglitazone (ACTOS) 15 MG tablet [id number] Yes Take 1 tablet (15 mg total) by mouth daily. Joselyn Arrow, MD Taking Active   Potassium 99 MG TABS [id number] Yes Take 1 tablet by mouth daily. [provider] Taking Active   simvastatin (ZOCOR) 20 MG tablet [id number] Yes TAKE 1 TABLET BY MOUTH DAILY Joselyn Arrow, MD Taking Active   Specialty Vitamins Products (MEMORY COMPLEX BRAIN HEALTH PO) [id number] Yes Take 1 capsule by mouth daily. [provider] Taking Active   TURMERIC CURCUMIN PO [id number] Yes Take 1 capsule by mouth daily. Hemp oil and Turmeric with Bioperine 6000mg   daily [provider] Taking Active   vitamin C (ASCORBIC ACID) 500 MG tablet [id number] Yes Take 500 mg by mouth daily. [provider] Taking Active   vitamin E 180 MG (400 UNITS) capsule [id number] Yes Take 400 Units by mouth daily. [provider] Taking Active               Assessment/Plan:   Diabetes: - Currently controlled but bydureon expected to be unavailable soon and want to maximize regimen to slow the progression of kidney disease - Reviewed long term cardiovascular and renal outcomes of uncontrolled blood sugar - Reviewed goal A1c, goal fasting, and goal 2 hour post prandial glucose -START Farxiga 10mg  daily, hold actos. Check sugars once daily to monitor with new med change. Should qualify for PAP through [employer]. Will provide samples and get the application submitted on Thursday (12/19) when patient plans to come by.  -Future consideration: switch bydureon to ozempic and enroll patient in PAP through [employer].     Follow Up Plan: 3 weeks  Sherrill Raring, PharmD Clinical Pharmacist [phone]

## 2023-11-29 ENCOUNTER — Telehealth: Payer: Self-pay | Admitting: Pharmacist

## 2023-11-29 NOTE — Telephone Encounter (Signed)
 Pt called asking about Farxiga  PAP as she had not received or heard anything about her shipment. Per [employer] fax in chart, it has shipped 12/31 and should take 1-2 business days. I was unable to track the package using the number on the fax. Contacted patient to let her know we did receive the shipment notification and Farxiga  should arrive today or possibly Monday. Advised she call if she has not received by Tuesday. She has 1 week of samples left.  Darrelyn Drum, PharmD, BCPS, CPP Clinical Pharmacist Practitioner [facility] [phone]

## 2023-12-02 ENCOUNTER — Other Ambulatory Visit: Payer: Self-pay | Admitting: Family Medicine

## 2023-12-02 DIAGNOSIS — R809 Proteinuria, unspecified: Secondary | ICD-10-CM

## 2023-12-02 DIAGNOSIS — E1169 Type 2 diabetes mellitus with other specified complication: Secondary | ICD-10-CM

## 2023-12-05 ENCOUNTER — Ambulatory Visit: Payer: Medicare Other

## 2023-12-05 DIAGNOSIS — E1169 Type 2 diabetes mellitus with other specified complication: Secondary | ICD-10-CM

## 2023-12-05 NOTE — Progress Notes (Signed)
 12/05/2023 Name: Tonya Rogers MRN: [id number] DOB: 1936-05-16  Chief Complaint  Patient presents with   Diabetes    Tonya Rogers is a 88 y.o. year old female who presented for a telephone visit.   They were referred to the pharmacist by their PCP for assistance in managing diabetes and complex medication management. And ckd.    Subjective:  Care Team: Primary Care Provider: Randol Dawes, MD   Medication Access/Adherence  Current Pharmacy:  [facility] [phone] [place], [place] - [address] AT [facility] [address] & [facility] [address] [place] [address] Phone: [phone] Fax: [phone]  [facility] - [place], [place] - [address] [place] [address] Phone: [phone] Fax: [phone]  [facility] [address] [place] - [address] [place] [address] Phone: [phone] Fax: [phone]  [facility] - [place], [place] - [address] [place] [address] Phone: [phone] Fax: [phone]   Patient reports affordability concerns with their medications: No  Patient reports access/transportation concerns to their pharmacy: No  Patient reports adherence concerns with their medications:  No     Diabetes:  Current medications: Bydureon , Metformin  500mg  1 BID, Farxiga  10mg  daily Medications tried in the past: Actos  (replaced with Farxiga  for better kidney benefit)  Current glucose readings: Checking at least BID and most readings within goal, occasional excursions to 140s in th morning and one 209 reading since holding actos  and starting farxiga   Has 3 injections left on hand of bydureon  before time to refill (injects on Sundays).  Observed patterns:  Patient denies hypoglycemic s/sx including dizziness, shakiness, sweating. Patient denies hyperglycemic symptoms including polyuria, polydipsia,  polyphagia, nocturia, neuropathy, blurred vision.   Current medication access support: Farxiga  through [employer]    Objective:  Lab Results  Component Value Date   HGBA1C 5.7 (H) 11/05/2023    Lab Results  Component Value Date   CREATININE 0.87 11/05/2023   BUN 14 11/05/2023   NA 133 (L) 11/05/2023   K 4.7 11/05/2023   CL 92 (L) 11/05/2023   CO2 26 11/05/2023    Lab Results  Component Value Date   CHOL 167 02/07/2023   HDL 89 02/07/2023   LDLCALC 65 02/07/2023   TRIG 69 02/07/2023   CHOLHDL 1.9 02/07/2023    Medications Reviewed Today     Reviewed by Lionell Jon DEL, RPH (Pharmacist) on 12/05/23 at 1447  Med List Status: <None>   Medication Order Taking? Sig Documenting Provider Last Dose Status Informant  acetaminophen (TYLENOL) 650 MG CR tablet [id number] No Take 650 mg by mouth every 8 (eight) hours as needed for pain. [provider] Taking Active   ALPHA LIPOIC ACID PO [id number] No Take 1 capsule by mouth daily. 600mg  [provider] Taking Active   BIOTIN PO [id number] No Take 1 tablet by mouth daily. [provider] Taking Active            Med Note [name] Stevan Nov 06, 2023  3:16 PM)    Calcium Carb-Cholecalciferol (CALCIUM 600+D3) 600-20 MG-MCG TABS [id number] No Take 1 tablet by mouth daily. [provider] Taking Active   Cholecalciferol (VITAMIN D) 1000 UNITS capsule [id number] No Take 1,000 Units by mouth daily. [provider] Taking Active   Chromium-Cinnamon [phone] MCG-MG CAPS [id number] No Take 1 capsule by mouth 2 (two) times daily. [provider]  Taking Active   Coenzyme Q10 (CO Q 10) 100 MG CAPS [id number] No Take 1 capsule by mouth daily. [provider] Taking Active   CRANBERRY PO [id number] No Take 1 capsule by mouth daily. [provider] Taking Active   cyanocobalamin (VITAMIN B12) 1000 MCG tablet [id number] No Take 1,000 mcg by mouth daily. [provider]  Taking Active   docusate sodium (COLACE) 100 MG capsule [id number] No Take 100 mg by mouth 2 (two) times daily. [provider] Taking Active            Med Note [name] Stevan Nov 06, 2023  3:17 PM) As needed  Exenatide  ER (BYDUREON  BCISE) 2 MG/0.85ML AUIJ [id number] No INJECT 1 PEN  SUBCUTANEOUSLY ONCE A WEEK Knapp, Eve, MD Taking Active   fish oil-omega-3 fatty acids 1000 MG capsule [id number] No Take 3 g by mouth daily. [provider] Taking Active   glucose blood test strip [id number] No 1 each by Other route 2 (two) times daily. Test twice daily Randol Dawes, MD Taking Active   levothyroxine  (SYNTHROID ) 75 MCG tablet [id number] No TAKE 1 TABLET DAILY FOR 6 DAYS A WEEK, SKIP SUNDAYS FOR HYPOTHYROIDISM. TAKE 30 MINUTES BEFORE EATING AS DIRECTED. Randol Dawes, MD Taking Active   losartan  (COZAAR ) 25 MG tablet [id number]  TAKE 1 TABLET(25 MG) BY MOUTH DAILY Randol Dawes, MD  Active   Melatonin 10 MG TABS [id number] No Take 1 tablet by mouth at bedtime. [provider] Taking Active   metFORMIN  (GLUCOPHAGE ) 500 MG tablet [id number] No TAKE 1 TABLET BY MOUTH TWICE  DAILY WITH MEALS Randol Dawes, MD Taking Active   Multiple Vitamins-Minerals (EYE VITAMINS PO) [id number] No Take 1 capsule by mouth daily. [provider] Taking Active   Multiple Vitamins-Minerals (MULTIVITAMIN WITH MINERALS) tablet [id number] No Take 1 tablet by mouth daily. [provider] Taking Active   Discontinued 12/05/23 1447 (Change in therapy)   Potassium 99 MG TABS [id number] No Take 1 tablet by mouth daily. [provider] Taking Active   simvastatin  (ZOCOR ) 20 MG tablet [id number] No TAKE 1 TABLET BY MOUTH DAILY Randol Dawes, MD Taking Active   Specialty Vitamins Products (MEMORY COMPLEX BRAIN HEALTH PO) [id number] No Take 1 capsule by mouth daily. [provider] Taking Active   TURMERIC CURCUMIN PO [id number] No Take 1 capsule by mouth daily. Hemp oil and Turmeric with  Bioperine 6000mg  daily [provider] Taking Active   vitamin C (ASCORBIC ACID) 500 MG tablet [id number] No Take 500 mg by mouth daily. [provider] Taking Active   vitamin E 180 MG (400 UNITS) capsule [id number] No Take 400 Units by mouth daily. [provider] Taking Active               Assessment/Plan:   Diabetes: - Currently controlled but bydureon  expected to be unavailable soon and want to maximize regimen to slow the progression of kidney disease - Reviewed long term cardiovascular and renal outcomes of uncontrolled blood sugar - Reviewed goal A1c, goal fasting, and goal 2 hour post prandial glucose -Completed application for Novo in office with patient and PCP approval for starting dose of Ozempic 0.5mg  weekly. Patient instructed to continue current medication regimen at this time until Ozempic app is approved and medication received in office.     Follow Up Plan: 3 weeks  Jon VEAR Lindau, PharmD Clinical Pharmacist [phone]

## 2023-12-19 ENCOUNTER — Telehealth: Payer: Self-pay

## 2023-12-19 NOTE — Telephone Encounter (Signed)
PAP: Patient assistance application for Ozempic has been approved by PAP Companies: [employer] from 12/06/2023 to 11/25/2024. Medication should be delivered to PAP Delivery: Provider's office. For further shipping updates, please [employer] at [phone]. Patient ID is: [id number]  PLEASE BE ADVISED LETTER OF APPROVAL IS IN MEDIA OF CHART

## 2023-12-24 ENCOUNTER — Other Ambulatory Visit: Payer: Self-pay | Admitting: Family Medicine

## 2023-12-24 DIAGNOSIS — E7800 Pure hypercholesterolemia, unspecified: Secondary | ICD-10-CM

## 2023-12-24 DIAGNOSIS — E118 Type 2 diabetes mellitus with unspecified complications: Secondary | ICD-10-CM

## 2023-12-30 ENCOUNTER — Telehealth: Payer: Self-pay

## 2023-12-30 NOTE — Telephone Encounter (Signed)
Pt called requesting to speak with you. States she has finished all the bydureon she has on hand. Would like a call back.

## 2023-12-30 NOTE — Telephone Encounter (Signed)
Spoke with patient she was approved for Ozempic but it is not here yet (PAP). Can you please check on this? Thanks

## 2023-12-31 NOTE — Telephone Encounter (Signed)
 Contacted [employer] on behalf of patient to request a voucher, as her shipment has been delayed. Voucher Info below: BIN: [id number] PCN: [id number] Group: [id number] ID: [id number]  Contacted [facility] pharmacy at request of patient and provided a 28DS supply rx while we await new shipment from Nov[employer].  Patient last injected Bydureon  on 2/2. Counseled to start Ozempic on 2/9.  Scheduled f/u for 01/30/24 to see how she is tolerating and responding to dose.   Jon VEAR Lindau, PharmD Clinical Pharmacist [phone]

## 2023-12-31 NOTE — Telephone Encounter (Signed)
Marylene Land can you check on this whenever you call Novo please

## 2024-01-01 ENCOUNTER — Other Ambulatory Visit: Payer: Self-pay | Admitting: Family Medicine

## 2024-01-01 DIAGNOSIS — E1169 Type 2 diabetes mellitus with other specified complication: Secondary | ICD-10-CM

## 2024-01-06 ENCOUNTER — Ambulatory Visit (INDEPENDENT_AMBULATORY_CARE_PROVIDER_SITE_OTHER): Payer: Medicare Other | Admitting: Podiatry

## 2024-01-06 ENCOUNTER — Encounter: Payer: Self-pay | Admitting: Podiatry

## 2024-01-06 VITALS — Ht 59.0 in | Wt 134.0 lb

## 2024-01-06 DIAGNOSIS — M79674 Pain in right toe(s): Secondary | ICD-10-CM | POA: Diagnosis not present

## 2024-01-06 DIAGNOSIS — B351 Tinea unguium: Secondary | ICD-10-CM

## 2024-01-06 DIAGNOSIS — M79675 Pain in left toe(s): Secondary | ICD-10-CM

## 2024-01-06 NOTE — Progress Notes (Signed)
 Subjective:  Patient ID: Tonya Rogers, female    DOB: 06-19-1936,  MRN: [id number]  Tonya Rogers presents to clinic today for:  Chief Complaint  Patient presents with   [facility]   Patient notes nails are thick, discolored, elongated and painful in shoegear when trying to ambulate.  Last A1c was 5.6.  She has been applying the ciclopirox  solution to the nails daily.  There is some lacquer buildup today.  PCP is Roosvelt Colla, MD.  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 Constancia Delton   Osteoporosis    Pulmonary nodules    small, seen on CT 12/09   Pure hypercholesterolemia     Past Surgical History:  Procedure Laterality Date   CATARACT EXTRACTION, BILATERAL  01/2011   excision of melanoma  11/23/08   wide excision of melanoma and R inguinal sentinel LN mapping and  biopsy   excision of recurrent melanoma  2011, 06/2014, 02/2018   RLE   TONSILLECTOMY AND ADENOIDECTOMY     TYMPANOSTOMY TUBE PLACEMENT  2013   L ear  (Dr. Darlin Ehrlich)   No Known Allergies  Review of Systems: Negative except as noted in the HPI.  Objective:  Tonya Rogers is a pleasant 88 y.o. female in NAD. AAO x 3.  Vascular Examination: Capillary refill time is 3-5 seconds to toes bilateral. Palpable pedal pulses b/l LE. Digital hair present b/l.  Skin temperature gradient WNL b/l. No varicosities b/l. No cyanosis  noted b/l.   Dermatological Examination: Pedal skin with normal turgor, texture and tone b/l. No open wounds. No interdigital macerations b/l. Toenails x10 are 3-76mm thick, discolored, dystrophic with subungual debris. There is pain with compression of the nail plates.  They are elongated x10.  There is overall improvement of the nails upon removal of the lateral and better inspection of the nail plates.     Latest Ref Rng & Units 11/05/2023    2:08 PM 06/05/2023   10:56 AM 01/30/2023   11:52 AM  Hemoglobin A1C  Hemoglobin-A1c 4.8 - 5.6 % 5.7  5.6  5.7    Assessment/Plan: 1. Pain due to onychomycosis of toenails of both feet     The mycotic toenails were sharply debrided x10 with sterile nail nippers and a power debriding burr to decrease bulk/thickness and length.    Continue with the ciclopirox  lacquer daily to the nails.  She will take that in the next couple days off to let the skin heal before resuming the medication  F/u 3 months   Tiearra Colwell D. Darleth Eustache, DPM, FACFAS [facility]     [address]  [place], [place] [address]                Office [phone]  Fax [phone]

## 2024-01-07 ENCOUNTER — Encounter: Payer: Self-pay | Admitting: *Deleted

## 2024-01-07 ENCOUNTER — Other Ambulatory Visit: Payer: Self-pay | Admitting: Family Medicine

## 2024-01-07 DIAGNOSIS — E1169 Type 2 diabetes mellitus with other specified complication: Secondary | ICD-10-CM

## 2024-01-07 NOTE — Telephone Encounter (Signed)
Pt called and is asking for her  pioglitazone (ACTOS) 15 MG tablet [[id number]] to be refilled before any bad weather starts. If it cannot be refilled she would like a call. [facility] #[id number] [place],  - [address] AT [facility] OF [facility] [address] & [facility] [address] [place] [address] Phone: [phone]  Fax: [phone]

## 2024-01-07 NOTE — Telephone Encounter (Signed)
Called patient, last note states no longer on actos. She is takinf 10mg  farxiga. Confirmed with patient that she has 10mf farxiga. She did read me the bottle, she has it. She didn't remember to stop the actos-she will stop now.

## 2024-01-09 ENCOUNTER — Ambulatory Visit: Payer: Self-pay

## 2024-01-09 DIAGNOSIS — Z79899 Other long term (current) drug therapy: Secondary | ICD-10-CM | POA: Diagnosis not present

## 2024-01-09 DIAGNOSIS — C4371 Malignant melanoma of right lower limb, including hip: Secondary | ICD-10-CM | POA: Diagnosis not present

## 2024-01-09 DIAGNOSIS — R918 Other nonspecific abnormal finding of lung field: Secondary | ICD-10-CM | POA: Diagnosis not present

## 2024-01-09 NOTE — Patient Outreach (Signed)
  Care Coordination   01/09/2024 Name: Tonya Rogers MRN: [id number] DOB: 1936-05-13   Care Coordination Outreach Attempts:  An unsuccessful outreach was attempted for an appointment today.  Follow Up Plan:  Additional outreach attempts will be made to offer the patient complex care management information and services.   Encounter Outcome:  No Answer   Care Coordination Interventions:  No, not indicated    Delsa Sale RN BSN CCM [place]  [facility], [facility] Nurse Care Coordinator  Direct Dial: [phone] Website: [email or web address]

## 2024-01-17 DIAGNOSIS — Z9289 Personal history of other medical treatment: Secondary | ICD-10-CM | POA: Diagnosis not present

## 2024-01-17 DIAGNOSIS — C779 Secondary and unspecified malignant neoplasm of lymph node, unspecified: Secondary | ICD-10-CM | POA: Diagnosis not present

## 2024-01-17 DIAGNOSIS — C439 Malignant melanoma of skin, unspecified: Secondary | ICD-10-CM | POA: Diagnosis not present

## 2024-01-17 DIAGNOSIS — C4371 Malignant melanoma of right lower limb, including hip: Secondary | ICD-10-CM | POA: Diagnosis not present

## 2024-01-28 ENCOUNTER — Telehealth: Payer: Self-pay | Admitting: Family Medicine

## 2024-01-28 NOTE — Telephone Encounter (Signed)
 PAP OZEMPIC received, pt informed

## 2024-01-30 ENCOUNTER — Ambulatory Visit: Payer: Medicare Other

## 2024-01-30 VITALS — Wt 114.1 lb

## 2024-01-30 DIAGNOSIS — E1169 Type 2 diabetes mellitus with other specified complication: Secondary | ICD-10-CM

## 2024-01-30 NOTE — Progress Notes (Signed)
 01/30/2024 Name: Tonya Rogers MRN: [id number] DOB: 03/23/1936  Chief Complaint  Patient presents with   Diabetes   Medication Management    Tonya Rogers is a 88 y.o. year old female who presented for a telephone visit.   They were referred to the pharmacist by their PCP for assistance in managing diabetes and complex medication management. And CKD.   Subjective:  Care Team: Primary Care Provider: Joselyn Arrow, MD   Medication Access/Adherence  Current Pharmacy:  [facility]ORE [phone] [place], [place] - [address] AT [facility] OF [address] & [facility] [address] [place] [address] Phone: [phone] Fax: [phone]  [facility] - [place], [place] - [address] [place] [address] Phone: [phone] Fax: [phone]  [facility] [address], [place] - [address] [place] [address] Phone: [phone] Fax: [phone]  [facility] - [place], [place] - [address] [place] [address] Phone: [phone] Fax: [phone]   Patient reports affordability concerns with their medications: No  Patient reports access/transportation concerns to their pharmacy: No  Patient reports adherence concerns with their medications:  No     Diabetes:  Current medications: Ozempic 0.5mg  Metformin 500mg  1 BID, Farxiga 10mg  daily Medications tried in the past: Actos (replaced with [place] for better kidney benefit)  Current glucose readings: All readings at goal, checking 2-3 times daily  Observed patterns:  Patient denies hypoglycemic s/sx including dizziness, shakiness, sweating. Patient denies hyperglycemic symptoms including polyuria, polydipsia, polyphagia, nocturia, neuropathy, blurred vision.   Current medication access support: Farxiga through [employer]    Objective:  Lab Results  Component Value Date    HGBA1C 5.7 (H) 11/05/2023    Lab Results  Component Value Date   CREATININE 0.87 11/05/2023   BUN 14 11/05/2023   NA 133 (L) 11/05/2023   K 4.7 11/05/2023   CL 92 (L) 11/05/2023   CO2 26 11/05/2023    Lab Results  Component Value Date   CHOL 167 02/07/2023   HDL 89 02/07/2023   LDLCALC 65 02/07/2023   TRIG 69 02/07/2023   CHOLHDL 1.9 02/07/2023    Medications Reviewed Today     Reviewed by Sherrill Raring, RPH (Pharmacist) on 01/30/24 at 1428  Med List Status: <None>   Medication Order Taking? Sig Documenting Provider Last Dose Status Informant  acetaminophen (TYLENOL) 650 MG CR tablet [id number] No Take 650 mg by mouth every 8 (eight) hours as needed for pain. [provider] Taking Active   ALPHA LIPOIC ACID PO [id number] No Take 1 capsule by mouth daily. 600mg  [provider] Taking Active   BIOTIN PO [id number] No Take 1 tablet by mouth daily. [provider] Taking Active            Med Note Colon Branch Nov 06, 2023  3:16 PM)    Calcium Carb-Cholecalciferol (CALCIUM 600+D3) 600-20 MG-MCG TABS [id number] No Take 1 tablet by mouth daily. [provider] Taking Active   Cholecalciferol (VITAMIN D) 1000 UNITS capsule [id number] No Take 1,000 Units by mouth daily. [provider] Taking Active   Chromium-Cinnamon [phone] MCG-MG CAPS [id number] No Take 1 capsule by mouth 2 (two) times daily. [provider] Taking Active   Coenzyme Q10 (CO Q 10) 100 MG CAPS [id number] No Take 1 capsule by mouth daily. [provider] Taking Active   CRANBERRY PO [id number]  No Take 1 capsule by mouth daily. [provider] Taking Active   cyanocobalamin (VITAMIN B12) 1000 MCG tablet [id number] No Take 1,000 mcg by mouth daily. [provider] Taking Active   dapagliflozin propanediol (FARXIGA) 10 MG TABS tablet [id number] No Take 10 mg by mouth daily. [provider] Taking Active   docusate sodium  (COLACE) 100 MG capsule [id number] No Take 100 mg by mouth 2 (two) times daily. [provider] Taking Active            Med Note Colon Branch Nov 06, 2023  3:17 PM) As needed  fish oil-omega-3 fatty acids 1000 MG capsule [id number] No Take 3 g by mouth daily. [provider] Taking Active   glucose blood test strip [id number] No 1 each by Other route 2 (two) times daily. Test twice daily Joselyn Arrow, MD Taking Active   levothyroxine (SYNTHROID) 75 MCG tablet [id number] No TAKE 1 TABLET DAILY FOR 6 DAYS A WEEK, SKIP SUNDAYS FOR HYPOTHYROIDISM. TAKE 30 MINUTES BEFORE EATING AS DIRECTED. Joselyn Arrow, MD Taking Active   losartan (COZAAR) 25 MG tablet [id number] No TAKE 1 TABLET(25 MG) BY MOUTH DAILY Joselyn Arrow, MD Taking Active   Melatonin 10 MG TABS [id number] No Take 1 tablet by mouth at bedtime. [provider] Taking Active   metFORMIN (GLUCOPHAGE) 500 MG tablet [id number] No TAKE 1 TABLET BY MOUTH TWICE  DAILY WITH MEALS Joselyn Arrow, MD Taking Active   Multiple Vitamins-Minerals (EYE VITAMINS PO) [id number] No Take 1 capsule by mouth daily. [provider] Taking Active   Multiple Vitamins-Minerals (MULTIVITAMIN WITH MINERALS) tablet [id number] No Take 1 tablet by mouth daily. [provider] Taking Active   Potassium 99 MG TABS [id number] No Take 1 tablet by mouth daily. [provider] Taking Active   Semaglutide,0.25 or 0.5MG /DOS, (OZEMPIC, 0.25 OR 0.5 MG/DOSE,) 2 MG/1.5ML SOPN [id number] No Inject 0.5 mg into the skin once a week. Getting through Novo PAP. Starting 01/05/24 [provider] Taking Active   simvastatin (ZOCOR) 20 MG tablet [id number] No TAKE 1 TABLET BY MOUTH DAILY Joselyn Arrow, MD Taking Active   Specialty Vitamins Products (MEMORY COMPLEX BRAIN HEALTH PO) [id number] No Take 1 capsule by mouth daily. [provider] Taking Active   TURMERIC CURCUMIN PO [id number] No Take 1 capsule by mouth daily. Hemp oil and Turmeric  with Bioperine 6000mg  daily [provider] Taking Active   vitamin C (ASCORBIC ACID) 500 MG tablet [id number] No Take 500 mg by mouth daily. [provider] Taking Active   vitamin E 180 MG (400 UNITS) capsule [id number] No Take 400 Units by mouth daily. [provider] Taking Active               Assessment/Plan:   Diabetes: - Currently controlled  - Reviewed long term cardiovascular and renal outcomes of uncontrolled blood sugar - Reviewed goal A1c, goal fasting, and goal 2 hour post prandial glucose -Continue current medication therapy -Noted 20lb weight loss in span of 1 month, patient denies any appetite changes with ozempic, reviewed her diet and patient has been over-restricting her carbs. Counseled to up her carb and protein intake. Monitor weight at home    Follow Up Plan: 3 months  Sherrill Raring, PharmD Clinical Pharmacist [phone]

## 2024-02-27 ENCOUNTER — Ambulatory Visit: Payer: Self-pay

## 2024-02-27 NOTE — Patient Instructions (Signed)
 Visit Information  Thank you for taking time to visit with me today. Please don't hesitate to contact me if I can be of assistance to you.   Following are the goals we discussed today:   Goals Addressed             This Visit's Progress    COMPLETED: To continue to stay healthy and live independently       Care Coordination Interventions: Provided education to patient about basic DM disease process Reviewed medications with patient and discussed importance of medication adherence Review of patient status, including review of consultants reports, relevant laboratory and other test results, and medications completed Counseled on Diabetic diet, my plate method, using portion control Positive reinforcement given to patient for making efforts to improve her healthcare Informed patient of nurse case closure due to patient is managing her chronic conditions and staying healthy  Instructed patient contact her PCP to report new symptoms or concerns that may arise Mailed printed educational materials related to Carb Choices  Lab Results  Component Value Date   HGBA1C 5.7 (H) 11/05/2023         If you are experiencing a Mental Health or Behavioral Health Crisis or need someone to talk to, please call 1-800-273-TALK (toll free, 24 hour hotline)  Patient verbalizes understanding of instructions and care plan provided today and agrees to view in MyChart. Active MyChart status and patient understanding of how to access instructions and care plan via MyChart confirmed with patient.     Delsa Sale RN BSN CCM [place]  [facility], [facility] Health Nurse Care Coordinator  Direct Dial: [phone] Website: [email or web address]

## 2024-02-27 NOTE — Patient Outreach (Signed)
 Care Coordination   Follow Up Visit Note   02/27/2024 Name: Kimiko Common MRN: [id number] DOB: December 15, 1935  Aaleigha Bozza is a 88 y.o. year old female who sees Joselyn Arrow, MD for primary care. I spoke with  Marisa Sprinkles by phone today.  What matters to the patients health and wellness today?  Patient would like to remain healthy and continue to manage her chronic conditions without difficulty.     Goals Addressed             This Visit's Progress    COMPLETED: To continue to stay healthy and live independently       Care Coordination Interventions: Provided education to patient about basic DM disease process Reviewed medications with patient and discussed importance of medication adherence Review of patient status, including review of consultants reports, relevant laboratory and other test results, and medications completed Counseled on Diabetic diet, my plate method, using portion control Positive reinforcement given to patient for making efforts to improve her healthcare Informed patient of nurse case closure due to patient is managing her chronic conditions and staying healthy  Instructed patient contact her PCP to report new symptoms or concerns that may arise Mailed printed educational materials related to Carb Choices  Lab Results  Component Value Date   HGBA1C 5.7 (H) 11/05/2023     Interventions Today    Flowsheet Row Most Recent Value  Chronic Disease   Chronic disease during today's visit Diabetes  General Interventions   General Interventions Discussed/Reviewed General Interventions Discussed, General Interventions Reviewed, Doctor Visits, Labs  Doctor Visits Discussed/Reviewed Doctor Visits Discussed, Doctor Visits Reviewed, PCP  Education Interventions   Education Provided Provided Education, Provided Printed Education  Provided Verbal Education On Nutrition, Medication, When to see the doctor, Labs  Labs Reviewed Hgb A1c, Kidney Function  Nutrition  Interventions   Nutrition Discussed/Reviewed Nutrition Discussed, Nutrition Reviewed, Portion sizes, Decreasing sugar intake  Pharmacy Interventions   Pharmacy Dicussed/Reviewed Pharmacy Topics Reviewed  Safety Interventions   Safety Discussed/Reviewed Home Safety          SDOH assessments and interventions completed:  Yes  SDOH Interventions Today    Flowsheet Row Most Recent Value  SDOH Interventions   Food Insecurity Interventions Intervention Not Indicated  Housing Interventions Intervention Not Indicated  Utilities Interventions Intervention Not Indicated        Care Coordination Interventions:  Yes, provided   Follow up plan: No further intervention required.   Encounter Outcome:  Patient Visit Completed

## 2024-04-09 ENCOUNTER — Telehealth: Payer: Self-pay | Admitting: Family Medicine

## 2024-04-09 NOTE — Telephone Encounter (Signed)
 Copied from CRM [id number]. Topic: Clinical - Prescription Issue >> Apr 09, 2024  9:09 AM Everlene Hobby D wrote: Lynnie Saucier calling from [employer] my [employer]- call back [phone]. Form medication for insurance was incomplete.

## 2024-04-09 NOTE — Telephone Encounter (Signed)
 Looks like the only thing the office prescribes that could need a PA is Ozempic and Faxiga, and she gets both through med assistance. So we will keep an eye out for a fax or key to pop up in Cmms to address.

## 2024-04-27 ENCOUNTER — Ambulatory Visit: Payer: Medicare Other | Admitting: Podiatry

## 2024-04-29 NOTE — Telephone Encounter (Signed)
 Ozempic received, pt informed

## 2024-04-30 ENCOUNTER — Ambulatory Visit

## 2024-05-01 DIAGNOSIS — C439 Malignant melanoma of skin, unspecified: Secondary | ICD-10-CM | POA: Diagnosis not present

## 2024-05-05 ENCOUNTER — Ambulatory Visit: Admitting: Podiatry

## 2024-05-07 ENCOUNTER — Other Ambulatory Visit

## 2024-05-07 ENCOUNTER — Ambulatory Visit

## 2024-05-07 ENCOUNTER — Other Ambulatory Visit: Payer: Self-pay | Admitting: *Deleted

## 2024-05-07 VITALS — Wt 106.2 lb

## 2024-05-07 DIAGNOSIS — E1169 Type 2 diabetes mellitus with other specified complication: Secondary | ICD-10-CM

## 2024-05-07 DIAGNOSIS — E039 Hypothyroidism, unspecified: Secondary | ICD-10-CM

## 2024-05-07 MED ORDER — SYNTHROID 75 MCG PO TABS: ORAL_TABLET | ORAL | 0 refills | Status: DC

## 2024-05-07 NOTE — Progress Notes (Signed)
 05/07/2024 Name: Tonya Rogers MRN: [id number] DOB: 1936/09/07  Chief Complaint  Patient presents with   Medication Management   Diabetes    Tonya Rogers is a 88 y.o. year old female who presented for a telephone visit.   They were referred to the pharmacist by their PCP for assistance in managing diabetes and complex medication management. And CKD.   Subjective:  Care Team: Primary Care Provider: Roosvelt Colla, MD   Medication Access/Adherence  Current Pharmacy:  [facility] [phone] [place], [place] - [address] AT [facility] OF [address] & [facility] [place] [address] Phone: [phone] Fax: [phone]  [facility] Delivery - [place], [place] - [address] [place] [address] Phone: [phone] Fax: [phone]  [facility] [address], [place] - [address] [place] [address] Phone: [phone] Fax: [phone]  [facility] - [place], [place] - [address] [place] [address] Phone: [phone] Fax: [phone]   Patient reports affordability concerns with their medications: No  Patient reports access/transportation concerns to their pharmacy: No  Patient reports adherence concerns with their medications:  No     Diabetes:  Current medications: Ozempic 0.5mg  Metformin  500mg  1 BID, Farxiga 10mg  daily Medications tried in the past: Actos  (replaced with Farxiga for better kidney benefit)  Current glucose readings: All readings at goal, checking 2-3 times daily, no low BG, checking 1 hour post-meal and sugars are in 120s-150s  Observed patterns:  Patient denies hypoglycemic s/sx including dizziness, shakiness, sweating. Patient denies hyperglycemic symptoms including polyuria, polydipsia, polyphagia, nocturia, neuropathy, blurred vision.   Current medication access support: Farxiga through  [employer]    Objective:  Lab Results  Component Value Date   HGBA1C 5.7 (H) 11/05/2023    Lab Results  Component Value Date   CREATININE 0.87 11/05/2023   BUN 14 11/05/2023   NA 133 (L) 11/05/2023   K 4.7 11/05/2023   CL 92 (L) 11/05/2023   CO2 26 11/05/2023    Lab Results  Component Value Date   CHOL 167 02/07/2023   HDL 89 02/07/2023   LDLCALC 65 02/07/2023   TRIG 69 02/07/2023   CHOLHDL 1.9 02/07/2023    Medications Reviewed Today     Reviewed by Carnell Christian, RPH (Pharmacist) on 05/07/24 at 1436  Med List Status: <None>   Medication Order Taking? Sig Documenting Provider Last Dose Status Informant  acetaminophen (TYLENOL) 650 MG CR tablet [id number] Yes Take 650 mg by mouth every 8 (eight) hours as needed for pain. [provider]  Active   ALPHA LIPOIC ACID PO [id number] Yes Take 1 capsule by mouth daily. 600mg  [provider]  Active   BIOTIN PO [id number] Yes Take 1 tablet by mouth daily. [provider]  Active            Med Note Anthony Kirk Nov 06, 2023  3:16 PM)    Calcium Carb-Cholecalciferol (CALCIUM 600+D3) 600-20 MG-MCG TABS [id number] Yes Take 1 tablet by mouth daily. [provider]  Active   Cholecalciferol (VITAMIN D) 1000 UNITS capsule [id number] Yes Take 1,000 Units by mouth daily. [provider]  Active   Chromium-Cinnamon [phone] MCG-MG CAPS [id number] Yes Take 1 capsule by mouth 2 (two) times daily. [provider]  Active   Coenzyme Q10 (CO Q 10) 100 MG CAPS [id number] Yes Take 1 capsule by  mouth daily. [provider]  Active   CRANBERRY PO [id number] Yes Take 1 capsule by mouth daily. [provider]  Active   cyanocobalamin (VITAMIN B12) 1000 MCG tablet [id number] Yes Take 1,000 mcg by mouth daily. [provider]  Active   dapagliflozin propanediol (FARXIGA) 10 MG TABS tablet [id number] Yes Take 10 mg by mouth daily. [provider]  Active   docusate  sodium (COLACE) 100 MG capsule [id number] Yes Take 100 mg by mouth 2 (two) times daily. [provider]  Active            Med Note Anthony Kirk Nov 06, 2023  3:17 PM) As needed  fish oil-omega-3 fatty acids 1000 MG capsule [id number] Yes Take 3 g by mouth daily. [provider]  Active   glucose blood test strip [id number] Yes 1 each by Other route 2 (two) times daily. Test twice daily Roosvelt Colla, MD  Active   levothyroxine  (SYNTHROID ) 75 MCG tablet [id number] Yes TAKE 1 TABLET DAILY FOR 6 DAYS A WEEK, SKIP SUNDAYS FOR HYPOTHYROIDISM. TAKE 30 MINUTES BEFORE EATING AS DIRECTED. Roosvelt Colla, MD  Active   losartan  (COZAAR ) 25 MG tablet [id number] Yes TAKE 1 TABLET(25 MG) BY MOUTH DAILY Roosvelt Colla, MD  Active   Melatonin 10 MG TABS [id number] Yes Take 1 tablet by mouth at bedtime. [provider]  Active   metFORMIN  (GLUCOPHAGE ) 500 MG tablet [id number] Yes TAKE 1 TABLET BY MOUTH TWICE  DAILY WITH MEALS Roosvelt Colla, MD  Active   Multiple Vitamins-Minerals (EYE VITAMINS PO) [id number] Yes Take 1 capsule by mouth daily. [provider]  Active   Multiple Vitamins-Minerals (MULTIVITAMIN WITH MINERALS) tablet [id number] Yes Take 1 tablet by mouth daily. [provider]  Active   Potassium 99 MG TABS [id number] Yes Take 1 tablet by mouth daily. [provider]  Active   Semaglutide,0.25 or 0.5MG /DOS, (OZEMPIC, 0.25 OR 0.5 MG/DOSE,) 2 MG/1.5ML SOPN [id number] Yes Inject 0.5 mg into the skin once a week. Getting through Novo PAP. Starting 01/05/24 [provider]  Active   simvastatin  (ZOCOR ) 20 MG tablet [id number] Yes TAKE 1 TABLET BY MOUTH DAILY Roosvelt Colla, MD  Active   Specialty Vitamins Products (MEMORY COMPLEX BRAIN HEALTH PO) [id number] Yes Take 1 capsule by mouth daily. [provider]  Active   TURMERIC CURCUMIN PO [id number] Yes Take 1 capsule by mouth daily. Hemp oil and Turmeric with Bioperine 6000mg  daily [provider]  Active    vitamin C (ASCORBIC ACID) 500 MG tablet [id number] Yes Take 500 mg by mouth daily. [provider]  Active   vitamin E 180 MG (400 UNITS) capsule [id number] Yes Take 400 Units by mouth daily. [provider]  Active               Assessment/Plan:   Diabetes: - Currently controlled  - Reviewed long term cardiovascular and renal outcomes of uncontrolled blood sugar - Reviewed goal A1c, goal fasting, and goal 2 hour post prandial glucose -Continue current medication therapy -Noted continued weight loss, patient denies any appetite changes with ozempic, reviewed her diet and patient has been over-restricting her carbs despite instructions at previous appt to increase. Counseled to up her carb and protein intake. Monitor weight at home. If weight loss has continued, may need to consider stopping ozempic at follow up.    Follow Up Plan: 1 month  Carnell Christian, PharmD Clinical Pharmacist [phone]

## 2024-05-21 ENCOUNTER — Telehealth: Payer: Self-pay

## 2024-05-21 NOTE — Telephone Encounter (Signed)
 Received request for a new prescription from [employer] Farxiga.

## 2024-05-21 NOTE — Telephone Encounter (Signed)
 Re-faxed the signed rx from earlier this year that gives her enough refills for the remainder of the year. Thank you!

## 2024-05-28 ENCOUNTER — Other Ambulatory Visit: Payer: Self-pay | Admitting: Family Medicine

## 2024-05-28 DIAGNOSIS — E7800 Pure hypercholesterolemia, unspecified: Secondary | ICD-10-CM

## 2024-05-28 DIAGNOSIS — E118 Type 2 diabetes mellitus with unspecified complications: Secondary | ICD-10-CM

## 2024-06-03 NOTE — Progress Notes (Unsigned)
 No chief complaint on file.  Patient presents for 6 month follow-up on chronic problems.   Diabetes:  Her current regimen includes Ozempic 0.5mg  Metformin  500mg  1 BID, Farxiga 10mg  daily. This was switched from prior regimen (taking at the time of her CPE) of Bydureon  B-cise, pioglitazone  and metformin . Her A1c was 5.7 on the prior regimen, due for recheck today.  She has had some noted significant weight loss. She is walking daily at [facility]. She recently starting doing weight-bearing exercise ***   She denies vaginal discharge, itch, rash, urinary complaints. Diarrhea?  Sugars are running ***  Denies hypoglycemia, polydipsia or polyuria.  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 Not painful or burning. Last eye exam was 03/2023, no retinopathy. ***  She checks her feet regularly, no sores/concerns. She has microalbuminuria (ratio elevated at 145 in 01/2023, and up to 588 in 10/2023).   She is taking losartan  25mg . She was started on Farxiga since then, to help the kidneys.   Component Ref Range & Units (hover) 7 mo ago 1 yr ago 2 yr ago 3 yr ago 4 yr ago 5 yr ago 6 yr ago  Creatinine, Urine 22.9 43.5 96.8 59.1 113.3 133.7   Microalbumin, Urine 134.6 63.2 148.6 71.7 100.3 268.2   Microalb/Creat Ratio 588 High  145 High  CM 154 High  CM 121 High  CM 89 High  CM 200.6 High  R, CM 146 High      Hyperlipidemia--She is compliant with her medications, taking 20mg  of simvastatin  and 3000mg  of fish oil. Denies any side effects to the medications, and is following a lowfat, low cholesterol diet.  Lipids were at goal on last check, due for recheck.   Lab Results  Component Value Date   CHOL 167 02/07/2023   HDL 89 02/07/2023   LDLCALC 65 02/07/2023   TRIG 69 02/07/2023   CHOLHDL 1.9 02/07/2023     Hypertension follow-up:  She previously took losartan  100mg . This was stopped after hospitalization for GI bleed, when BP's were  low. It was restarted at 25mg  (microalbuminuria). She limits the sodium in her diet. She doesn't monitor BP at home, but reports normal values at other doctors. Denies dizziness, headaches, chest pain, DOE, edema.   BP Readings from Last 3 Encounters:  11/06/23 128/78  06/05/23 132/70  01/30/23 130/70   Hypothyroidism:  She continues to take 75 mcg 6 days/week (none on Sunday).  TSH has been normal on this dose (last checked July here, August by oncologist). Energy is better since completing chemo. Her nails grow well, but break easily.  Moods are good.  Hair loss is chronic. Has a wig, doesn't wear it often. Bowels are fair (mild constipation, stool softeners help).   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 She continues regular care at [facility], saw Dr. Claryce last month. Currently off systemic therapy. She received TVEC therapy and achieved a clinical complete response to TVEC. No signs of recurrence clinically.  Plan is continued surveillance. Last PET scan was 12/2023: IMPRESSION:  CONCLUSION:  Multiple new small subcutaneous soft tissue nodules in the bilateral anteromedial upper thighs with associated tracer uptake above blood pool are nonspecific and could represent infectious or inflammatory process versus less likely in-transit metastasis. The involved area on the right is similar to area of prior disease, the findings on the left would be atypical location  for in-transit metastasis in the absence of known prior left lower extremity involvement. Recommend correlation with clinical inspection of the skin.    PMH, PSH, SH reviewed   ROS:  No fever, chills, URI symptoms, headaches, dizziness, chest pain, palpitations, syncope, dyspnea on exertion, swelling, nausea, vomiting, abdominal pain, melena, hematochezia, indigestion/heartburn. No joint pains, weakness, tremor or enlarged lymph nodes. Some easy bruising,  unchanged. +tingling in both hands, L>R, no longer in her feet.  Up 1-2x/night to void. Denies dysuria, hematuria. Nose runs when she eats, not too bothersome. Weight loss  No changes to hair/nails/moods.     PHYSICAL EXAM:  There were no vitals taken for this visit.  Wt Readings from Last 3 Encounters:  05/07/24 106 lb 3.2 oz (48.2 kg)  01/30/24 114 lb 1.6 oz (51.8 kg)  01/06/24 134 lb (60.8 kg)    Elderly female, in no distress. Hard of hearing.  HEENT: conjunctiva and sclera are clear, EOMI. OP clear.  Significant alopecia across the entire top of her head.  Skin on scalp appears normal. Neck: no lymphadenopathy, thyromegaly or mass Heart: regular rate and rhythm Lungs: clear bilaterally Extremities: no edema.  Neuro: alert and oriented, cranial nerves grossly intact.   Skin: normal turgor, no rashes. Multiple slightly hyperpigmented and slightly bluish (almost appears bruised) focal skin discoloration along the medial R lower leg. Unchanged. Psych: normal mood, affect, hygiene and grooming     ASSESSMENT/PLAN:   POC A1c today. Lipids, TSH, c-met, cbc, urine microalb/cr  Last eye exam we have is from 03/2023.  Has she had eye exam since, or scheduled?  Get result if has gone this year.  ***REFILL SIMVASTATIN  AFTER LABS BACK  If A1c low, consider decreasing dose of ozempic.  Schedule CPE for 6 months  ?med check sooner if changes

## 2024-06-04 ENCOUNTER — Ambulatory Visit (INDEPENDENT_AMBULATORY_CARE_PROVIDER_SITE_OTHER): Admitting: Family Medicine

## 2024-06-04 ENCOUNTER — Encounter: Payer: Self-pay | Admitting: Family Medicine

## 2024-06-04 VITALS — BP 128/74 | HR 83 | Wt 106.4 lb

## 2024-06-04 DIAGNOSIS — Z5181 Encounter for therapeutic drug level monitoring: Secondary | ICD-10-CM | POA: Diagnosis not present

## 2024-06-04 DIAGNOSIS — E1169 Type 2 diabetes mellitus with other specified complication: Secondary | ICD-10-CM

## 2024-06-04 DIAGNOSIS — R809 Proteinuria, unspecified: Secondary | ICD-10-CM

## 2024-06-04 DIAGNOSIS — E871 Hypo-osmolality and hyponatremia: Secondary | ICD-10-CM | POA: Diagnosis not present

## 2024-06-04 DIAGNOSIS — E1159 Type 2 diabetes mellitus with other circulatory complications: Secondary | ICD-10-CM

## 2024-06-04 DIAGNOSIS — I152 Hypertension secondary to endocrine disorders: Secondary | ICD-10-CM

## 2024-06-04 DIAGNOSIS — C439 Malignant melanoma of skin, unspecified: Secondary | ICD-10-CM | POA: Diagnosis not present

## 2024-06-04 DIAGNOSIS — C779 Secondary and unspecified malignant neoplasm of lymph node, unspecified: Secondary | ICD-10-CM | POA: Diagnosis not present

## 2024-06-04 DIAGNOSIS — E039 Hypothyroidism, unspecified: Secondary | ICD-10-CM | POA: Diagnosis not present

## 2024-06-04 DIAGNOSIS — E785 Hyperlipidemia, unspecified: Secondary | ICD-10-CM

## 2024-06-04 LAB — POCT GLYCOSYLATED HEMOGLOBIN (HGB A1C): Hemoglobin A1C: 5.3 % (ref 4.0–5.6)

## 2024-06-04 MED ORDER — METFORMIN HCL 500 MG PO TABS: 500.0000 mg | ORAL_TABLET | Freq: Two times a day (BID) | ORAL | 1 refills | Status: DC

## 2024-06-04 MED ORDER — LOSARTAN POTASSIUM 25 MG PO TABS: ORAL_TABLET | ORAL | 1 refills | Status: DC

## 2024-06-04 NOTE — Patient Instructions (Addendum)
 You are due for your yearly diabetic eye exam.  Please be sure to schedule this with a new eye doctor.  Please stop taking the Turmeric supplement.  There have been many reports of liver toxicity from taking too much of this supplement.    Continue to use the weights at least 2x/week.  Be sure to get plenty of protein in your diet, in order to prevent loss of muscles.  We are going to stop the ozempic for 3 months, and continue just the Farxiga and the metformin .  We will have you return in 3 months and recheck your hemoglobin A1c. If you notice a sharp rise in your blood sugars before your next visit, please let us  know. Please call or send us  a MyChart message with your sugars if they are running high, or schedule a follow-up appointment.

## 2024-06-05 ENCOUNTER — Ambulatory Visit: Payer: Self-pay | Admitting: Family Medicine

## 2024-06-05 DIAGNOSIS — E7800 Pure hypercholesterolemia, unspecified: Secondary | ICD-10-CM

## 2024-06-05 LAB — MICROALBUMIN / CREATININE URINE RATIO
Creatinine, Urine: 35.1 mg/dL
Microalb/Creat Ratio: 160 mg/g{creat} — ABNORMAL HIGH (ref 0–29)
Microalbumin, Urine: 56.1 ug/mL

## 2024-06-09 ENCOUNTER — Other Ambulatory Visit

## 2024-06-09 DIAGNOSIS — E871 Hypo-osmolality and hyponatremia: Secondary | ICD-10-CM | POA: Diagnosis not present

## 2024-06-09 DIAGNOSIS — E785 Hyperlipidemia, unspecified: Secondary | ICD-10-CM | POA: Diagnosis not present

## 2024-06-09 DIAGNOSIS — E1169 Type 2 diabetes mellitus with other specified complication: Secondary | ICD-10-CM | POA: Diagnosis not present

## 2024-06-09 DIAGNOSIS — Z5181 Encounter for therapeutic drug level monitoring: Secondary | ICD-10-CM | POA: Diagnosis not present

## 2024-06-09 DIAGNOSIS — E039 Hypothyroidism, unspecified: Secondary | ICD-10-CM

## 2024-06-09 DIAGNOSIS — E1159 Type 2 diabetes mellitus with other circulatory complications: Secondary | ICD-10-CM

## 2024-06-09 DIAGNOSIS — I152 Hypertension secondary to endocrine disorders: Secondary | ICD-10-CM | POA: Diagnosis not present

## 2024-06-10 LAB — CBC WITH DIFFERENTIAL/PLATELET
Basophils Absolute: 0.1 x10E3/uL (ref 0.0–0.2)
Basos: 2 %
EOS (ABSOLUTE): 0.4 x10E3/uL (ref 0.0–0.4)
Eos: 6 %
Hematocrit: 45 % (ref 34.0–46.6)
Hemoglobin: 14.5 g/dL (ref 11.1–15.9)
Immature Grans (Abs): 0 x10E3/uL (ref 0.0–0.1)
Immature Granulocytes: 0 %
Lymphocytes Absolute: 1.6 x10E3/uL (ref 0.7–3.1)
Lymphs: 27 %
MCH: 30.7 pg (ref 26.6–33.0)
MCHC: 32.2 g/dL (ref 31.5–35.7)
MCV: 95 fL (ref 79–97)
Monocytes Absolute: 0.6 x10E3/uL (ref 0.1–0.9)
Monocytes: 9 %
Neutrophils Absolute: 3.5 x10E3/uL (ref 1.4–7.0)
Neutrophils: 56 %
Platelets: 359 x10E3/uL (ref 150–450)
RBC: 4.72 x10E6/uL (ref 3.77–5.28)
RDW: 12.8 % (ref 11.7–15.4)
WBC: 6.2 x10E3/uL (ref 3.4–10.8)

## 2024-06-10 LAB — TSH: TSH: 2.89 u[IU]/mL (ref 0.450–4.500)

## 2024-06-10 LAB — LIPID PANEL
Chol/HDL Ratio: 2 ratio (ref 0.0–4.4)
Cholesterol, Total: 187 mg/dL (ref 100–199)
HDL: 92 mg/dL (ref >39)
LDL Chol Calc (NIH): 83 mg/dL (ref 0–99)
Triglycerides: 66 mg/dL (ref 0–149)
VLDL Cholesterol Cal: 12 mg/dL (ref 5–40)

## 2024-06-10 LAB — COMPREHENSIVE METABOLIC PANEL WITH GFR
ALT: 19 IU/L (ref 0–32)
AST: 26 IU/L (ref 0–40)
Albumin: 4.4 g/dL (ref 3.7–4.7)
Alkaline Phosphatase: 58 IU/L (ref 44–121)
BUN/Creatinine Ratio: 20 (ref 12–28)
BUN: 16 mg/dL (ref 8–27)
Bilirubin Total: 0.6 mg/dL (ref 0.0–1.2)
CO2: 23 mmol/L (ref 20–29)
Calcium: 10.3 mg/dL (ref 8.7–10.3)
Chloride: 95 mmol/L — ABNORMAL LOW (ref 96–106)
Creatinine, Ser: 0.81 mg/dL (ref 0.57–1.00)
Globulin, Total: 2.9 g/dL (ref 1.5–4.5)
Glucose: 94 mg/dL (ref 70–99)
Potassium: 3.8 mmol/L (ref 3.5–5.2)
Sodium: 134 mmol/L (ref 134–144)
Total Protein: 7.3 g/dL (ref 6.0–8.5)
eGFR: 71 mL/min/1.73 (ref >59)

## 2024-06-10 MED ORDER — SIMVASTATIN 20 MG PO TABS: 20.0000 mg | ORAL_TABLET | Freq: Every day | ORAL | 3 refills | Status: DC

## 2024-06-23 ENCOUNTER — Ambulatory Visit (INDEPENDENT_AMBULATORY_CARE_PROVIDER_SITE_OTHER): Admitting: Podiatry

## 2024-06-23 ENCOUNTER — Encounter: Payer: Self-pay | Admitting: Podiatry

## 2024-06-23 DIAGNOSIS — M79675 Pain in left toe(s): Secondary | ICD-10-CM | POA: Diagnosis not present

## 2024-06-23 DIAGNOSIS — B351 Tinea unguium: Secondary | ICD-10-CM | POA: Diagnosis not present

## 2024-06-23 DIAGNOSIS — M79674 Pain in right toe(s): Secondary | ICD-10-CM | POA: Diagnosis not present

## 2024-06-23 NOTE — Progress Notes (Unsigned)
 Subjective:  Patient ID: Tonya Rogers, female    DOB: [birth year],  MRN: [id number]  Tonya Rogers presents to clinic today for fungal nail check.  She has been using the ciclopirox  lacquer on the toenail.  A caregiver is present with her today.  Patient is diabetic and last saw her PCP on 06/05/2024.  Her last A1c was around 5.3.  Patient is wondering if she should just go ahead and have all of her toenails removed.  She is requesting this and wants to discuss further.  PCP is Randol Dawes, MD.  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 Irving   Osteoporosis    Pulmonary nodules    small, seen on CT 12/09   Pure hypercholesterolemia    Past Surgical History:  Procedure Laterality Date   CATARACT EXTRACTION, BILATERAL  01/2011   excision of melanoma  11/23/08   wide excision of melanoma and R inguinal sentinel LN mapping and  biopsy   excision of recurrent melanoma  2011, 06/2014, 02/2018   RLE   TONSILLECTOMY AND ADENOIDECTOMY     TYMPANOSTOMY TUBE PLACEMENT  2013   L ear  (Dr. Karis)   No Known Allergies  Review of Systems: Negative except as noted in the HPI.  Objective:  Vascular Examination: Capillary refill time is 3-5 seconds to toes bilateral. Palpable pedal pulses b/l LE. Digital hair present b/l.    Dermatological Examination: Pedal skin with normal turgor, texture  and tone b/l. No open wounds. No interdigital macerations b/l.  The bilateral toenails are 3mm thick, discolored, dystrophic with subungual debris. There is pain with compression of the nail plates.  Most involvement is noted on the right hallux nail and bilateral second toenails.  Better visualization occurred once the nail lacquer was burred away today.     Latest Ref Rng & Units 06/04/2024    2:29 PM 11/05/2023    2:08 PM  Hemoglobin A1C  Hemoglobin-A1c 4.0 - 5.6 % 5.3  5.7    Assessment/Plan: 1. Pain due to onychomycosis of toenails of both feet    The mycotic toenails were sharply debrided x 10 with sterile nail nippers and a power debriding burr to decrease bulk/thickness and length.  Informed the patient that the bilateral 3rd through 5th toenails, and left hallux nail appear to be resolving well with the use of the lacquer.  I would not recommend removal of those.  It does appear that there has been improvement of the right hallux nail but it is slower to respond.  The bilateral second toenails have moderate thickness noted and are slowest to show improvement.  If she were to have any toenails removed permanently, I would only recommend the right hallux and bilateral second toenails.  Due to her age and concern of healing,  I would recommend noninvasive arterial studies prior to proceeding with any toenail removal.  She understood.  Patient to continue with current treatment plan (ciclopirox  8% solution nightly to the toenails) for the fungal nails as there is improvement noted.  She will think about all of this and discuss it at her next appointment in 3 months   Savio Albrecht D. Dayanis Bergquist, DPM, FACFAS [facility]     [address] [place], [place] [address]                Office [phone]  Fax [phone]

## 2024-07-02 ENCOUNTER — Other Ambulatory Visit

## 2024-07-02 DIAGNOSIS — C4371 Malignant melanoma of right lower limb, including hip: Secondary | ICD-10-CM | POA: Diagnosis not present

## 2024-07-02 DIAGNOSIS — C439 Malignant melanoma of skin, unspecified: Secondary | ICD-10-CM | POA: Diagnosis not present

## 2024-07-02 DIAGNOSIS — E1169 Type 2 diabetes mellitus with other specified complication: Secondary | ICD-10-CM

## 2024-07-02 DIAGNOSIS — E119 Type 2 diabetes mellitus without complications: Secondary | ICD-10-CM | POA: Diagnosis not present

## 2024-07-02 DIAGNOSIS — C779 Secondary and unspecified malignant neoplasm of lymph node, unspecified: Secondary | ICD-10-CM | POA: Diagnosis not present

## 2024-07-02 NOTE — Progress Notes (Signed)
 07/02/2024 Name: Tonya Rogers MRN: [id number] DOB: [birth year]  Chief Complaint  Patient presents with   Medication Management   Diabetes    Tonya Rogers is a 88 y.o. year old female who presented for a telephone visit.   They were referred to the pharmacist by their PCP for assistance in managing diabetes and complex medication management. And CKD.   Subjective:  Care Team: Primary Care Provider: Randol Dawes, MD   Medication Access/Adherence  Current Pharmacy:  [facility] [phone] [place], [place] - [address] AT [facility] [address] [facility] [address] [place] [address] Phone: [phone] Fax: [phone]  [facility] - [place], [place] - [address] [place] [address] Phone: [phone] Fax: [phone]  [facility] [address] [place] - [address] [place]  [address] Phone: [phone] Fax: [phone]  [facility] - [place], [place] - [address] [place] [address] Phone: [phone] Fax: [phone]   Patient reports affordability concerns with their medications: No  Patient reports access/transportation concerns to their pharmacy: No  Patient reports adherence concerns with their medications:  No     Diabetes:  Current medications:  Metformin  500mg  1 BID, Farxiga 10mg  daily Medications tried in the past: Actos  (replaced with Farxiga for better kidney benefit), Bydureon  (replaced with ozempic due to removal from market), Ozempic (weight loss)  Current glucose readings: All readings at goal, checking 2-3 times daily, no low BG, checking 1 hour post-meal and sugars are in 120s-150s  Denies any sugar reading elevations following discontinuation of ozempic.  Has not gained any weight back since stopping ozempic. Weight has remained steady at 106 lbs per home scale. Observed  patterns:  Patient denies hypoglycemic s/sx including dizziness, shakiness, sweating. Patient denies hyperglycemic symptoms including polyuria, polydipsia, polyphagia, nocturia, neuropathy, blurred vision.   Current medication access support: Farxiga through [employer]    Objective:  Lab Results  Component Value Date   HGBA1C 5.3 06/04/2024    Lab Results  Component Value Date   CREATININE 0.81 06/09/2024   BUN 16 06/09/2024   NA 134 06/09/2024   K 3.8 06/09/2024   CL 95 (L) 06/09/2024   CO2 23 06/09/2024    Lab Results  Component Value Date   CHOL 187 06/09/2024   HDL 92 06/09/2024   LDLCALC 83 06/09/2024   TRIG 66 06/09/2024   CHOLHDL 2.0 06/09/2024    Medications Reviewed Today     Reviewed by Lionell Jon DEL, RPH (Pharmacist) on 07/02/24 at 1419  Med List Status: <None>   Medication Order Taking? Sig Documenting Provider Last Dose Status Informant  acetaminophen (TYLENOL) 650 MG CR tablet [id number]  Take 650 mg by mouth every 8 (eight) hours as needed for pain. [provider]  Active   ALPHA LIPOIC ACID PO [id number]  Take 1 capsule by mouth daily. 600mg  [provider]  Active   BIOTIN PO [id number]  Take 1 tablet by mouth daily. [provider]  Active            Med Note [name] Stevan Nov 06, 2023  3:16 PM)    Calcium Carb-Cholecalciferol (CALCIUM 600+D3) 600-20 MG-MCG TABS [id number]  Take 1 tablet by mouth daily. [provider]  Active   Cholecalciferol (VITAMIN D) 1000 UNITS capsule [id number]  Take 1,000 Units by mouth daily. [provider]  Active  Chromium-Cinnamon [phone] MCG-MG CAPS [id number]  Take 1 capsule by mouth 2 (two) times daily. [provider]  Active   Coenzyme Q10 (CO Q 10) 100 MG CAPS [id number]  Take 1 capsule by mouth daily. [provider]  Active   CRANBERRY PO [id number]  Take 1 capsule by mouth daily. [provider]  Active   cyanocobalamin (VITAMIN B12) 1000  MCG tablet [id number]  Take 1,000 mcg by mouth daily. [provider]  Active   dapagliflozin propanediol (FARXIGA) 10 MG TABS tablet [id number]  Take 10 mg by mouth daily. [provider]  Active   docusate sodium (COLACE) 100 MG capsule [id number]  Take 100 mg by mouth 2 (two) times daily. [provider]  Active            Med Note [name] Stevan Nov 06, 2023  3:17 PM) As needed  fish oil-omega-3 fatty acids 1000 MG capsule [id number]  Take 3 g by mouth daily. [provider]  Active   glucose blood test strip [id number]  1 each by Other route 2 (two) times daily. Test twice daily Randol Dawes, MD  Active   losartan  (COZAAR ) 25 MG tablet [id number]  TAKE 1 TABLET(25 MG) BY MOUTH DAILY Randol Dawes, MD  Active   Melatonin 10 MG TABS [id number]  Take 1 tablet by mouth at bedtime. [provider]  Active   metFORMIN  (GLUCOPHAGE ) 500 MG tablet [id number]  Take 1 tablet (500 mg total) by mouth 2 (two) times daily with a meal. Randol Dawes, MD  Active   Multiple Vitamins-Minerals (EYE VITAMINS PO) [id number]  Take 1 capsule by mouth daily. [provider]  Active   Multiple Vitamins-Minerals (MULTIVITAMIN WITH MINERALS) tablet [id number]  Take 1 tablet by mouth daily. [provider]  Active   Potassium 99 MG TABS [id number]  Take 1 tablet by mouth daily. [provider]  Active   simvastatin  (ZOCOR ) 20 MG tablet [id number]  Take 1 tablet (20 mg total) by mouth daily. Randol Dawes, MD  Active   Specialty Vitamins Products (MEMORY COMPLEX BRAIN HEALTH PO) [id number]  Take 1 capsule by mouth daily. [provider]  Active   SYNTHROID  75 MCG tablet [id number]  TAKE 1 TABLET DAILY FOR 6 DAYS A WEEK, SKIP SUNDAYS FOR HYPOTHYROIDISM. TAKE 30 MINUTES BEFORE EATING AS DIRECTED. Randol Dawes, MD  Active   vitamin C (ASCORBIC ACID) 500 MG tablet [id number]  Take 500 mg by mouth daily. [provider]  Active   vitamin E 180 MG (400 UNITS)  capsule [id number]  Take 400 Units by mouth daily. [provider]  Active               Assessment/Plan:   Diabetes: - Currently controlled  - Reviewed long term cardiovascular and renal outcomes of uncontrolled blood sugar - Reviewed goal A1c, goal fasting, and goal 2 hour post prandial glucose -Continue current medication therapy. Notify us  if weight loss continues despite ozempic being stopped. Counseled on importance of protein and mindful carb intake to maintain weight.     Follow Up Plan: 3 months  Jon VEAR Lindau, PharmD Clinical Pharmacist [phone]

## 2024-07-10 DIAGNOSIS — E038 Other specified hypothyroidism: Secondary | ICD-10-CM | POA: Diagnosis not present

## 2024-07-10 DIAGNOSIS — Z9289 Personal history of other medical treatment: Secondary | ICD-10-CM | POA: Diagnosis not present

## 2024-07-10 DIAGNOSIS — C439 Malignant melanoma of skin, unspecified: Secondary | ICD-10-CM | POA: Diagnosis not present

## 2024-07-10 DIAGNOSIS — C779 Secondary and unspecified malignant neoplasm of lymph node, unspecified: Secondary | ICD-10-CM | POA: Diagnosis not present

## 2024-07-10 DIAGNOSIS — C4371 Malignant melanoma of right lower limb, including hip: Secondary | ICD-10-CM | POA: Diagnosis not present

## 2024-07-13 ENCOUNTER — Ambulatory Visit: Payer: Self-pay

## 2024-07-13 NOTE — Telephone Encounter (Signed)
   FYI Only or Action Required?: Action required by provider: clinical question for provider.  Patient was last seen in primary care on 06/04/2024 by Randol Dawes, MD.  Called Nurse Triage reporting medication question.  Symptoms began today.  Interventions attempted: Nothing.  Symptoms are: n/a.  Triage Disposition: Information or Advice Only Call  Patient/caregiver understands and will follow disposition?: Yes Copied from CRM #[id number]. Topic: General - Call Back - No Documentation >> Jul 13, 2024 10:37 AM Rea BROCKS wrote: Reason for CRM: Patient would like a call back from Dr. Pixie nurse.   Patient would like to know if she needs to be taking 500 mg of metformin  or 1000 mg of metformin ?  [phone] Reason for Disposition  Caller has medicine question only, adult not sick, AND triager answers question  Answer Assessment - Initial Assessment Questions 1. NAME of MEDICINE: What medicine(s) are you calling about?     metforming 2. QUESTION: What is your question? (e.g., double dose of medicine, side effect)     Patient asking if she should take 500 mg or 1000 mg  3. PRESCRIBER: Who prescribed the medicine? Reason: if prescribed by specialist, call should be referred to that group.     Dr. Randol 4. SYMPTOMS: Do you have any symptoms? If Yes, ask: What symptoms are you having?  How bad are the symptoms (e.g., mild, moderate, severe)     none   Based on all documentation in chart, explained that patient should take one 500 mg metformin  twice a day.  She verbalizes understanding  Protocols used: Medication Question Call-A-AH

## 2024-07-13 NOTE — Telephone Encounter (Signed)
 Called patient and left message on her machine to let her know that she should be taking metformin  500mg  twice daily.

## 2024-07-14 ENCOUNTER — Telehealth: Payer: Self-pay

## 2024-07-14 NOTE — Telephone Encounter (Signed)
 Received request refill reorder form from [employer], fill and faxed to provider office to sign and date can be fax to [employer] or fax back to [phone].

## 2024-07-15 ENCOUNTER — Telehealth: Payer: Self-pay | Admitting: Family Medicine

## 2024-07-15 NOTE — Telephone Encounter (Signed)
 Forms received for patient assistance for Novo nordisk. She is currently NOT TAKING OZEMPIC--stopped due to weight loss.   Has f/u with me in October. Currently only on metformin  and Farxiga.  I am not completing these forms. Will address with our pharmacist Jon if it needs to be resumed after her follow-up visit.

## 2024-07-15 NOTE — Telephone Encounter (Signed)
 Per provider Randol pt is no longer taking Ozempic.we do not need to do refill for this medication.

## 2024-09-02 ENCOUNTER — Telehealth: Payer: Self-pay

## 2024-09-02 NOTE — Telephone Encounter (Signed)
 Gave pt a call pt is coming up due for reenrollment on [employer] Farxiga,spoke with pt and is aware she will received in the [place] fax provider portion.

## 2024-09-08 ENCOUNTER — Other Ambulatory Visit

## 2024-09-10 ENCOUNTER — Other Ambulatory Visit

## 2024-09-10 ENCOUNTER — Encounter: Payer: Self-pay | Admitting: Family Medicine

## 2024-09-10 ENCOUNTER — Ambulatory Visit

## 2024-09-10 ENCOUNTER — Ambulatory Visit: Admitting: Family Medicine

## 2024-09-10 VITALS — BP 112/64 | HR 64 | Ht 59.0 in | Wt 110.0 lb

## 2024-09-10 DIAGNOSIS — R809 Proteinuria, unspecified: Secondary | ICD-10-CM | POA: Diagnosis not present

## 2024-09-10 DIAGNOSIS — E118 Type 2 diabetes mellitus with unspecified complications: Secondary | ICD-10-CM | POA: Diagnosis not present

## 2024-09-10 DIAGNOSIS — E871 Hypo-osmolality and hyponatremia: Secondary | ICD-10-CM

## 2024-09-10 DIAGNOSIS — I152 Hypertension secondary to endocrine disorders: Secondary | ICD-10-CM | POA: Diagnosis not present

## 2024-09-10 DIAGNOSIS — C779 Secondary and unspecified malignant neoplasm of lymph node, unspecified: Secondary | ICD-10-CM

## 2024-09-10 DIAGNOSIS — E1159 Type 2 diabetes mellitus with other circulatory complications: Secondary | ICD-10-CM | POA: Diagnosis not present

## 2024-09-10 DIAGNOSIS — E785 Hyperlipidemia, unspecified: Secondary | ICD-10-CM | POA: Diagnosis not present

## 2024-09-10 DIAGNOSIS — E1169 Type 2 diabetes mellitus with other specified complication: Secondary | ICD-10-CM | POA: Diagnosis not present

## 2024-09-10 DIAGNOSIS — C439 Malignant melanoma of skin, unspecified: Secondary | ICD-10-CM | POA: Diagnosis not present

## 2024-09-10 DIAGNOSIS — E039 Hypothyroidism, unspecified: Secondary | ICD-10-CM

## 2024-09-10 LAB — POCT GLYCOSYLATED HEMOGLOBIN (HGB A1C): Hemoglobin A1C: 5 % (ref 4.0–5.6)

## 2024-09-10 MED ORDER — SYNTHROID 75 MCG PO TABS
ORAL_TABLET | ORAL | 2 refills | Status: AC
Start: 2024-09-10 — End: ?

## 2024-09-10 NOTE — Progress Notes (Signed)
 Chief Complaint  Patient presents with   Diabetes    Fasting med check. No new concerns. Prefers to get flu and covid at [facility]. Did not have DM eye exam this year, will schedule and believes she needs she should schedule updates hearing test as well.   Patient presents for med check. She actually thought she was just coming for labs, so didn't bring her lists of sugars, and chart wasn't prepped in advance (wasn't on the schedule for a visit with MD today at all, scheduled for next week).  Diabetes--patient presents for 3 month f/u after stopping ozempic. It was stopped due to significant weight loss, while taking ozempic along with metformin  and farxiga. She continues on metformin  and farxiga. She didn't bring her list of sugars with her, can't recall the values. She recalls the lowest has been 112, up to 143 (before eating).  After eating it can be 120-160, per her recall.  Denies hypoglycemia, polydipsia or polyuria (but notes frequency). Has some neuropathy in hands, unchanged (related to chemo)--no longer having any tingling in her feet. Not painful or burning, just tingling.. Last eye exam was 03/2023, no retinopathy. Her doctor retired.  She hasn't scheduled an appointment yet.  Component Ref Range & Units (hover) 14:47 (09/10/24) 3 mo ago (06/04/24) 10 mo ago (11/05/23) 1 yr ago (06/05/23) 1 yr ago (01/30/23) 1 yr ago (10/22/22) 2 yr ago (02/26/22)  Hemoglobin A1C 5.0 5.3 5.7 High  R, CM 5.6 5.7 Abnormal  5.3 R, CM 6.1 High  R, CM    Microalbuminuria--rechecked in July after being on farxiga in addition to losartan , and this had improved (was still elevated).  Component Ref Range & Units (hover) 3 mo ago 10 mo ago 1 yr ago 2 yr ago 3 yr ago 4 yr ago 5 yr ago  Creatinine, Urine 35.1 22.9 43.5 96.8 59.1 113.3 133.7  Microalbumin, Urine 56.1 134.6 63.2 148.6 71.7 100.3 268.2  Microalb/Creat Ratio 160 High  588 High  CM 145 High  CM 154 High  CM 121 High  CM 89 High  CM 200.6 High  R,  CM    Hyperlipidemia--She is compliant with her medications, taking 20mg  of simvastatin  and 3000mg  of fish oil. Denies any side effects to the medications, and is following a lowfat, low cholesterol diet.  Lipids were at goal on last check on this regimen. Denies changes to her diet.  Lab Results  Component Value Date   CHOL 187 06/09/2024   HDL 92 06/09/2024   LDLCALC 83 06/09/2024   TRIG 66 06/09/2024   CHOLHDL 2.0 06/09/2024    Hypertension follow-up:  She previously took losartan  100mg . This was stopped after hospitalization for GI bleed, when BP's were low. It was restarted at 25mg  (microalbuminuria). She limits the sodium in her diet. She only occasionally checks BP at home, can't recall the values, all fine. Denies dizziness, headaches, chest pain, DOE, edema.   BP Readings from Last 3 Encounters:  09/10/24 112/64  06/04/24 128/74  11/06/23 128/78    Hypothyroidism:  She continues to take 75 mcg 6 days/week (none on Sunday).  TSH has been normal on this dose, at goal on last check.  Energy remains good. Her nails grow well, but break easily--denies change.  Moods are good.  Hair loss is chronic.  She has some constipation, manageable. Sometimes has very large bowel movements.  Lab Results  Component Value Date   TSH 2.890 06/09/2024   TSH was checked 8/15 at [facility],  and was 1.876.  Other labs checked at that visit (07/10/2024 at [facility]) included CBC (normal), and c-met (Na 133, glu 102, Cr 0.77, normal LFTs, normal LDH  She continues to walk daily, and uses hand weights 4-5x/week. She walks with a cane. Walks well with a cart at [employer].  She denies any falls. She recently went back to [facility] and reports no new leg lesions or melanoma concerns.    PMH, PSH, SH reviewed  Outpatient Encounter Medications as of 09/10/2024  Medication Sig Note   acetaminophen (TYLENOL) 650 MG CR tablet Take 650 mg by mouth every 8 (eight) hours as needed for pain. 09/10/2024: As needed    ALPHA LIPOIC ACID PO Take 1 capsule by mouth daily. 600mg     BIOTIN PO Take 1 tablet by mouth daily.    Calcium Carb-Cholecalciferol (CALCIUM 600+D3) 600-20 MG-MCG TABS Take 1 tablet by mouth daily.    Cholecalciferol (VITAMIN D) 1000 UNITS capsule Take 1,000 Units by mouth daily.    Chromium-Cinnamon 725 402 4662 MCG-MG CAPS Take 1 capsule by mouth 2 (two) times daily.    Coenzyme Q10 (CO Q 10) 100 MG CAPS Take 1 capsule by mouth daily.    CRANBERRY PO Take 1 capsule by mouth daily.    cyanocobalamin (VITAMIN B12) 1000 MCG tablet Take 1,000 mcg by mouth daily.    dapagliflozin propanediol (FARXIGA) 10 MG TABS tablet Take 10 mg by mouth daily.    docusate sodium (COLACE) 100 MG capsule Take 100 mg by mouth 2 (two) times daily. 09/10/2024: As needed   fish oil-omega-3 fatty acids 1000 MG capsule Take 3 g by mouth daily.    glucose blood test strip 1 each by Other route 2 (two) times daily. Test twice daily    losartan  (COZAAR ) 25 MG tablet TAKE 1 TABLET(25 MG) BY MOUTH DAILY    Melatonin 10 MG TABS Take 1 tablet by mouth at bedtime.    metFORMIN  (GLUCOPHAGE ) 500 MG tablet Take 1 tablet (500 mg total) by mouth 2 (two) times daily with a meal.    Multiple Vitamins-Minerals (EYE VITAMINS PO) Take 1 capsule by mouth daily.    Multiple Vitamins-Minerals (MULTIVITAMIN WITH MINERALS) tablet Take 1 tablet by mouth daily.    Potassium 99 MG TABS Take 1 tablet by mouth daily.    simvastatin  (ZOCOR ) 20 MG tablet Take 1 tablet (20 mg total) by mouth daily.    Specialty Vitamins Products (MEMORY COMPLEX BRAIN HEALTH PO) Take 1 capsule by mouth daily.    SYNTHROID  75 MCG tablet TAKE 1 TABLET DAILY FOR 6 DAYS A WEEK, SKIP SUNDAYS FOR HYPOTHYROIDISM. TAKE 30 MINUTES BEFORE EATING AS DIRECTED.    vitamin C (ASCORBIC ACID) 500 MG tablet Take 500 mg by mouth daily.    vitamin E 180 MG (400 UNITS) capsule Take 400 Units by mouth daily.    No facility-administered encounter medications on file as of 09/10/2024.    No Known Allergies  ROS:  No fever, chills, URI symptoms, headaches, dizziness, chest pain, palpitations, syncope, dyspnea on exertion, swelling, nausea, vomiting, abdominal pain, melena, hematochezia, indigestion/heartburn. No joint pains, weakness, tremor or enlarged lymph nodes. Some easy bruising, unchanged. +tingling in both hands, L>R, unchanged Up 2-3x/night to void, gets back to sleep easily. Denies dysuria, hematuria. Nose runs when she eats, not too bothersome, unchanged Weight is stable. No changes to hair/nails/moods.  Mild constipation, manageable.   PHYSICAL EXAM:  BP 112/64   Pulse 64   Ht 4' 11 (1.499 m)  Wt 110 lb (49.9 kg)   BMI 22.22 kg/m   Wt Readings from Last 3 Encounters:  09/10/24 110 lb (49.9 kg)  06/04/24 106 lb 6.4 oz (48.3 kg)  05/07/24 106 lb 3.2 oz (48.2 kg)   Elderly female, in no distress. Hard of hearing.  HEENT: conjunctiva and sclera are clear, EOMI. OP clear.  Significant alopecia across the entire top of her head.  Skin on scalp appears normal. Neck: no lymphadenopathy, thyromegaly or mass, no bruit Heart: regular rate and rhythm, no murmur Lungs: clear bilaterally Abdomen: soft, nontender, no mass Extremities: no edema. 2+ pulses Neuro: alert and oriented, cranial nerves grossly intact.  Ambulates with cane. Skin: normal turgor, no rashes. No concerning skin changes on LE's--WHSS RLE. Some dry skin bilateral lower legs. Psych: normal mood, affect, hygiene and grooming   Lab Results  Component Value Date   HGBA1C 5.0 09/10/2024     ASSESSMENT/PLAN:  Controlled type 2 diabetes mellitus with complication, without long-term current use of insulin  (HCC) - A1c is even lower since ozempic was stopped, and weight is up some. Consider decreasing metformin  at f/u visit if A1c remains low. Cont farxiga - Plan: HgB A1c  Hypertension associated with diabetes (HCC) - well controlled.  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 most recently monitored by [facility] in August  Microalbuminuria - continue farxiga and losartan . BP and DM are well controlled. recheck urine microalbumin at her physical  Reminded to schedule diabetic eye exam. Declined flu and COVID vaccine--prefers to get at pharmacy.  Handicap placard expires in December Advised to get us  paperwork when she gets it, and we will renew (ambulates with cane).  F/u for CPE as scheduled--will need A1c and urine microalbumin at visit. Likely doesn't need c-met, also gets done through [facility].  Will review what labs are needed at that time. Lipids not needed, doesn't need to fast.  If A1c still low after the holidays, cut back on metformin 

## 2024-09-10 NOTE — Patient Instructions (Addendum)
 Please continue to monitor your blood sugars, and bring the lists to your visit. Please schedule your yearly diabetic eye exam (it was due back in May).   Your weight is up 4 pounds since your last visit, and your A1c is even lower than when you were taking the ozempic. This is all good news!  You do not need labs prior to your January physical, and you do NOT need to fast for that visit (since we checked your cholesterol in July.  Please get your flu shot and COVID vaccine (you preferred to get from the pharmacy, was offered today).

## 2024-09-11 ENCOUNTER — Telehealth: Payer: Self-pay | Admitting: Family Medicine

## 2024-09-11 DIAGNOSIS — Z23 Encounter for immunization: Secondary | ICD-10-CM | POA: Diagnosis not present

## 2024-09-11 NOTE — Telephone Encounter (Signed)
 Pt dropped off blood glucose log, put in Dr. Pixie folder

## 2024-09-14 NOTE — Telephone Encounter (Signed)
 Reviewed-- Morning sugars 100-135, mostly 115-125 Evening 126-172 (comment--too much FF and grits on the high value, had 1 reading of 89), mostly 140-150's

## 2024-09-16 ENCOUNTER — Encounter: Admitting: Family Medicine

## 2024-09-22 ENCOUNTER — Ambulatory Visit (INDEPENDENT_AMBULATORY_CARE_PROVIDER_SITE_OTHER): Admitting: Podiatry

## 2024-09-22 DIAGNOSIS — B351 Tinea unguium: Secondary | ICD-10-CM

## 2024-09-22 DIAGNOSIS — M79675 Pain in left toe(s): Secondary | ICD-10-CM | POA: Diagnosis not present

## 2024-09-22 DIAGNOSIS — M79674 Pain in right toe(s): Secondary | ICD-10-CM | POA: Diagnosis not present

## 2024-09-22 NOTE — Progress Notes (Unsigned)
 Subjective:  Patient ID: Tonya Rogers, female    DOB: 1936/07/20,  MRN: [id number]  Tonya Rogers presents to clinic today for:  Chief Complaint  Patient presents with   Nail Problem    Fungal nails check with [facility] if needed, A1c was 5.6 in Oct, No anti coag.    Patient notes nails are thick, discolored, elongated and painful in shoegear when trying to ambulate.  She has been applying the ciclopirox  daily to the toenails.  She feels that they are getting better.  PCP is Randol Dawes, MD.  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 Irving   Osteoporosis    Pulmonary nodules    small, seen on CT 12/09   Pure hypercholesterolemia    Past Surgical History:  Procedure Laterality Date   CATARACT EXTRACTION, BILATERAL  01/2011   excision of melanoma  11/23/08   wide excision of melanoma and R inguinal sentinel LN mapping and  biopsy   excision of recurrent melanoma  2011, 06/2014, 02/2018   RLE   TONSILLECTOMY AND ADENOIDECTOMY     TYMPANOSTOMY TUBE PLACEMENT  2013   L ear  (Dr. Karis)   No Known Allergies  Review of Systems: Negative except as noted in the HPI.  Objective:  Tonya Rogers is a pleasant 88 y.o. female in NAD. AAO x 3.  Vascular Examination: Capillary refill time is 3-5 seconds to toes bilateral. Palpable pedal pulses b/l LE. Digital hair present b/l.  Skin  temperature gradient WNL b/l. No varicosities b/l. No cyanosis noted b/l.   Dermatological Examination: Pedal skin with normal turgor, texture and tone b/l. No open wounds. No interdigital macerations b/l. Toenails x10 are 3mm thick, discolored, dystrophic with subungual debris. There is pain with compression of the nail plates.  They are elongated x10     Latest Ref Rng & Units 09/10/2024    2:47 PM 06/04/2024    2:29 PM 11/05/2023    2:08 PM  Hemoglobin A1C  Hemoglobin-A1c 4.0 - 5.6 % 5.0  5.3  5.7    Assessment/Plan: 1. Pain due to onychomycosis of toenails of both feet     The mycotic toenails were sharply debrided x10 with sterile nail nippers and a power debriding burr to decrease bulk/thickness and length.  For the patient that there is significant improvement of the fungal involvement to the toenails at this time.  Encouraged her to continue with the ciclopirox  topical medication once daily.  Return in about 3 months (around 12/23/2024) for [facility].   Awanda CHARM Imperial, DPM, FACFAS [facility]     [address]  [place], [place] [address]                Office [phone]  Fax [phone]

## 2024-09-28 ENCOUNTER — Telehealth: Payer: Self-pay | Admitting: Internal Medicine

## 2024-09-28 ENCOUNTER — Telehealth: Payer: Self-pay

## 2024-09-28 ENCOUNTER — Other Ambulatory Visit (HOSPITAL_COMMUNITY): Payer: Self-pay

## 2024-09-28 MED ORDER — DAPAGLIFLOZIN PROPANEDIOL 10 MG PO TABS: 10.0000 mg | ORAL_TABLET | Freq: Every day | ORAL | 0 refills | Status: DC

## 2024-09-28 NOTE — Telephone Encounter (Signed)
 Sent in medication

## 2024-09-28 NOTE — Telephone Encounter (Signed)
-----   Message from Jon VEAR Lindau sent at 09/28/2024  9:30 AM EST ----- Hello,  Pharmacy is requesting new refills on patient's Farxiga 10mg  that she gets through patient assistance program.  Prescription: Farxiga 10mg  1 tablet daily   Pharmacy Info: [facility] - [place], [place] - [address]: [phone] F: [phone]  Thank you! Jon VEAR Lindau, PharmD Clinical Pharmacist [phone]

## 2024-09-28 NOTE — Telephone Encounter (Signed)
 Received a refill request from [employer] on Farxiga can be fax to [facility] ot fax to [phone].

## 2024-10-01 ENCOUNTER — Other Ambulatory Visit: Payer: Self-pay

## 2024-10-05 ENCOUNTER — Other Ambulatory Visit (HOSPITAL_COMMUNITY): Payer: Self-pay

## 2024-10-05 ENCOUNTER — Other Ambulatory Visit: Payer: Self-pay | Admitting: Family Medicine

## 2024-10-05 NOTE — Telephone Encounter (Signed)
 Copied from CRM [id number]. Topic: Clinical - Medication Refill >> Oct 05, 2024 11:36 AM Joesph B wrote: Medication: dapagliflozin propanediol (FARXIGA) 10 MG TABS tablet   Has the patient contacted their pharmacy? Yes (Agent: If no, request that the patient contact the pharmacy for the refill. If patient does not wish to contact the pharmacy document the reason why and proceed with request.) (Agent: If yes, when and what did the pharmacy advise?)  This is the patient's preferred pharmacy:  [id number] [place], [place] - [address] AT [facility] [address] [facility] [address] & [facility] [address] [place] [address] Phone: [phone] Fax: [phone]   Is this the correct pharmacy for this prescription? Yes If no, delete pharmacy and type the correct one.   Has the prescription been filled recently? Yes  Is the patient out of the medication? No  Has the patient been seen for an appointment in the last year OR does the patient have an upcoming appointment? Yes  Can we respond through MyChart? Yes  Agent: Please be advised that Rx refills may take up to 3 business days. We ask that you follow-up with your pharmacy.

## 2024-10-08 ENCOUNTER — Other Ambulatory Visit (HOSPITAL_COMMUNITY): Payer: Self-pay

## 2024-10-08 NOTE — Telephone Encounter (Signed)
 Call [employer] due to keep receiving refill request  spoke with representative explain the one they received on Nov. 3rd only have a qty of 90 days supply no refill need to submitted  other one with refill.

## 2024-10-09 ENCOUNTER — Other Ambulatory Visit (HOSPITAL_COMMUNITY): Payer: Self-pay

## 2024-10-15 ENCOUNTER — Other Ambulatory Visit

## 2024-10-15 VITALS — Wt 112.8 lb

## 2024-10-15 DIAGNOSIS — E118 Type 2 diabetes mellitus with unspecified complications: Secondary | ICD-10-CM

## 2024-10-15 NOTE — Progress Notes (Signed)
   10/15/2024 Name: Tonya Rogers MRN: [id number] DOB: 1936/08/28  Chief Complaint  Patient presents with   Medication Management   Medication Assistance   Diabetes    Tonya Rogers is a 88 y.o. year old female who presented for a telephone visit.   They were referred to the pharmacist by their PCP for assistance in managing diabetes and complex medication management. And CKD.   Subjective:  Care Team: Primary Care Provider: Randol Dawes, MD   Medication Access/Adherence  Current Pharmacy:  [facility] [phone] [place], [place] - [address] AT [facility] OF [address] & [facility] [address] [place] [address] Phone: [phone] Fax: [phone]  [facility] - [place] - [address] [place]  [address] Phone: [phone] Fax: [phone]  [facility] [address] [place] - [address] [place] [address] Phone: [phone] Fax: [phone]  [facility] - [place], [place] - [address] [place] [address] Phone: [phone] Fax: [phone]  [facility] - [place], [place] - [address] [address] Phone: [phone] Fax: [phone]   Patient reports affordability concerns with their medications: No  Patient reports access/transportation concerns to their pharmacy: No  Patient reports adherence concerns with their medications:  No     Diabetes:  Current medications:  Metformin  500mg  1 BID, Farxiga  10mg  daily Medications tried in the past: Actos  (replaced with Farxiga  for better kidney benefit), Bydureon  (replaced with ozempic due to removal from market), Ozempic (weight loss)  Current glucose readings: All readings at goal, checking 2 times daily  Denies any sugar reading elevations following discontinuation of ozempic.  Has regained some weight since  stopping Ozempic.   Observed patterns:  Patient denies hypoglycemic s/sx including dizziness, shakiness, sweating. Patient denies hyperglycemic symptoms including polyuria, polydipsia, polyphagia, nocturia, neuropathy, blurred vision.   Current medication access support: Farxiga  through [employer]    Objective:  Lab Results  Component Value Date   HGBA1C 5.0 09/10/2024    Lab Results  Component Value Date   CREATININE 0.81 06/09/2024   BUN 16 06/09/2024   NA 134 06/09/2024   K 3.8 06/09/2024   CL 95 (L) 06/09/2024   CO2 23 06/09/2024    Lab Results  Component Value Date   CHOL 187 06/09/2024   HDL 92 06/09/2024   LDLCALC 83 06/09/2024   TRIG 66 06/09/2024   CHOLHDL 2.0 06/09/2024    Medications Reviewed Today   Medications were not reviewed in this encounter       Assessment/Plan:   Diabetes: - Currently controlled  - Reviewed long term cardiovascular and renal outcomes of uncontrolled blood sugar - Reviewed goal A1c, goal fasting, and goal 2 hour post prandial glucose -Continue current medication therapy.  -Submitted Farxiga  renewal app to company for processing.   Follow Up Plan: as needed by patient  Jon VEAR Lindau, PharmD Clinical Pharmacist [phone]

## 2024-10-20 NOTE — Telephone Encounter (Signed)
 Received approval letter [employer] Farxiga  thru 11/25/2025 approval letter index.

## 2024-10-22 ENCOUNTER — Other Ambulatory Visit: Payer: Self-pay | Admitting: Family Medicine

## 2024-10-22 DIAGNOSIS — R809 Proteinuria, unspecified: Secondary | ICD-10-CM

## 2024-10-23 ENCOUNTER — Other Ambulatory Visit: Payer: Self-pay | Admitting: Podiatry

## 2024-11-16 ENCOUNTER — Other Ambulatory Visit: Payer: Self-pay | Admitting: Family Medicine

## 2024-12-01 ENCOUNTER — Ambulatory Visit

## 2024-12-08 NOTE — Patient Instructions (Incomplete)
 " HEALTH MAINTENANCE RECOMMENDATIONS:  It is recommended that you get at least 30 minutes of aerobic exercise at least 5 days/week (for weight loss, you may need as much as 60-90 minutes). This can be any activity that gets your heart rate up. This can be divided in 10-15 minute intervals if needed, but try and build up your endurance at least once a week.  Weight bearing exercise is also recommended twice weekly.  Eat a healthy diet with lots of vegetables, fruits and fiber.  Colorful foods have a lot of vitamins (ie green vegetables, tomatoes, red peppers, etc).  Limit sweet tea, regular sodas and alcoholic beverages, all of which has a lot of calories and sugar.  Up to 1 alcoholic drink daily may be beneficial for women (unless trying to lose weight, watch sugars).  Drink a lot of water.  Calcium recommendations are 1200-1500 mg daily (1500 mg for postmenopausal women or women without ovaries), and vitamin D 1000 IU daily.  This should be obtained from diet and/or supplements (vitamins), and calcium should not be taken all at once, but in divided doses.  Monthly self breast exams and yearly mammograms for women over the age of 51 is recommended.  Sunscreen of at least SPF 30 should be used on all sun-exposed parts of the skin when outside between the hours of 10 am and 4 pm (not just when at beach or pool, but even with exercise, golf, tennis, and yard work!)  Use a sunscreen that says broad spectrum so it covers both UVA and UVB rays, and make sure to reapply every 1-2 hours.  Remember to change the batteries in your smoke detectors when changing your clock times in the spring and fall. Carbon monoxide detectors are recommended for your home.  Use your seat belt every time you are in a car, and please drive safely and not be distracted with cell phones and texting while driving.   Tonya Rogers , Thank you for taking time to come for your Medicare Wellness Visit. I appreciate your ongoing  commitment to your health goals. Please review the following plan we discussed and let me know if I can assist you in the future.   This is a list of the screening recommended for you and due dates:  Health Maintenance  Topic Date Due   Eye exam for diabetics  04/09/2024   Complete foot exam   11/05/2024   Medicare Annual Wellness Visit  11/05/2024   Hemoglobin A1C  03/11/2025   COVID-19 Vaccine (9 - Pfizer risk 2025-26 season) 03/12/2025   DTaP/Tdap/Td vaccine (4 - Td or Tdap) 11/29/2027   Pneumococcal Vaccine for age over 80  Completed   Flu Shot  Completed   Osteoporosis screening with Bone Density Scan  Completed   Zoster (Shingles) Vaccine  Completed   Meningitis B Vaccine  Aged Out   Breast Cancer Screening  Discontinued   You are past due for your diabetic eye exam.  You mentioned not being sure whether or not you scheduled an appointment with a new eye doctor down the road--please call them and ensure that you have an appointment scheduled.  Eat more chicken and fish (not fried).  Try and limit your beef, pork and lamb. Try and limit your eggs (egg whites are fine, but try and eat just 3-4 yolks/week).  You can either have more egg white omelets, or order something else (oatmeal, yogurt, a healthy cereal).  Periodically check your blood pressure (once or twice  a month), and record these on your log with your sugars.  If you are seeing blood pressure that are >135/85, then check more often until you see them come down. If they stay high, you need to talk to us . Be sure to watch and limit the sodium in your diet (keeping your total intake to 2000 mg or less). Avoid soups, and processed meats (lunch meats, bacon, hot dogs, sausage).  You are due for another bone density test in March.  Please call [facility] to schedule this. You also haven't had a mammogram since 01/2023. You should see if you can get both of these done on the same day.  You can probably cut back on some of your  vitamins (to help save money), as I'm not sure they are all providing benefit. Stop the chromium cinnamon supplement, the vitamin C and vitamin D (you get these through your multivitamins). I'm not sure that the memory supplement truly has any benefit. You should take the cranberry tablets only if/when you have burning with urination, not needed daily.  Please stop taking tylenol PM at night.  You can use regular tylenol for any discomfort. The PM portion can increase your fall risk and isn't safe to take regularly.  "

## 2024-12-08 NOTE — Progress Notes (Unsigned)
 " No chief complaint on file.  Tonya Rogers is a 89 y.o. female who presents for Medicare annual wellness visit (see separate note) and follow-up on chronic medical conditions. She has no specific complaints.      Hypothyroidism:  She continues to take 75 mcg 6 days/week (none on Sunday).  TSH has been normal on this dose, at goal on last check.  Energy remains good. Her nails grow well, but break easily--denies change.  Moods are good.  Hair loss is chronic.  She has some constipation, manageable.   Lab Results  Component Value Date   TSH 2.890 06/09/2024   TSH was also checked 07/10/24 at [facility], and was 1.876.   Diabetes:  She reports compliance with metformin  and Farxiga .  Ozempic was stopped in July 2025 due to significant weight loss (prior to ozempic she had been on Bydureon , which is no longer available).  Sugars are running ***   Denies hypoglycemia, polydipsia or polyuria (but notes frequency). Has some neuropathy in hands, unchanged (related to chemo)--no longer having any tingling in her feet. Not painful or burning, just tingling.. Last eye exam was 03/2023, no retinopathy. Her doctor retired.  She hasn't scheduled an appointment yet. ***   Lab Results  Component Value Date   HGBA1C 5.0 09/10/2024    Microalbuminuria--rechecked in July after being on farxiga  in addition to losartan , and this had improved (was still elevated). Due for recheck.   Component Ref Range & Units (hover) 6 mo ago (06/04/24) 1 yr ago (11/05/23) 1 yr ago (01/30/23) 2 yr ago (02/26/22) 3 yr ago (02/27/21) 4 yr ago (02/15/20) 6 yr ago (10/06/18)  Creatinine, Urine 35.1 22.9 43.5 96.8 59.1 113.3 133.7  Microalbumin, Urine 56.1 134.6 63.2 148.6 71.7 100.3 268.2  Microalb/Creat Ratio 160 High  588 High  CM 145 High  CM 154 High  CM 121 High  CM 89 High      Hyperlipidemia--She is compliant with her medications, taking 20mg  of simvastatin  and 3000mg  of fish oil. Denies any side effects to the  medications, and is following a lowfat, low cholesterol diet.  Lipids were at goal on last check on this regimen. Denies changes to her diet.  Lab Results  Component Value Date   CHOL 187 06/09/2024   HDL 92 06/09/2024   LDLCALC 83 06/09/2024   TRIG 66 06/09/2024   CHOLHDL 2.0 06/09/2024    Hypertension follow-up:  She previously took losartan  100mg . This was stopped after hospitalization for GI bleed, when BP's were low. It was restarted at 25mg  (due to microalbuminuria). She limits the sodium in her diet. She only occasionally checks BP at home, can't recall the values, all fine. Denies dizziness, headaches, chest pain, DOE, edema.  BP Readings from Last 3 Encounters:  09/10/24 112/64  06/04/24 128/74  11/06/23 128/78    Osteoporosis:  She took fosamax x 5 years, stopped in 2011 due to jaw complications (jaw bones were breaking apart). She continues to take regular calcium, Vitamin D. She had repeat DEXA in 01/2023, which showed improvement in her wrist, still had bilateral osteopenia at hips.    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 She continues regular care at [facility], and is being monitored, off systemic therapy.  She last saw Dr. Kennedy in August, after PET scan: CONCLUSION:  Compared to FDG PET/CT 01/09/2024:  1.  Interval resolution of previously seen FDG avid subcutaneous soft tissue deposits seen in the lower extremities. No evidence of recurrent or residual disease is  seen on the current study   She is scheduled to f/u with him, Dr. Claryce, and for repeat PET scan next month.   Immunization History  Administered Date(s) Administered   Fluad Quad(high Dose 65+) 07/20/2019, 08/29/2022, 09/11/2024   INFLUENZA, HIGH DOSE SEASONAL PF 09/12/2017, 08/06/2018, 08/13/2020, 08/18/2021, 08/27/2023   Influenza Split 08/26/2013, 09/08/2014, 08/17/2015   Influenza-Unspecified 08/20/2016   PFIZER Comirnaty(Gray Top)Covid-19 Tri-Sucrose Vaccine 03/28/2021   PFIZER(Purple  Top)SARS-COV-2 Vaccination 01/07/2020, 02/01/2020, 09/13/2020   PNEUMOCOCCAL CONJUGATE-20 11/06/2023   Pfizer Covid-19 Vaccine Bivalent Booster 49yrs & up 10/09/2021   Pfizer(Comirnaty)Fall Seasonal Vaccine 12 years and older 09/12/2022, 08/27/2023, 09/11/2024   Pneumococcal Conjugate-13 08/26/2014   Pneumococcal Polysaccharide-23 12/27/2000, 07/27/2008, 04/03/2018   Respiratory Syncytial Virus Vaccine,Recomb Aduvanted(Arexvy) 10/31/2022   Td 03/26/2005, 11/28/2017   Tdap 03/05/2012   Zoster Recombinant(Shingrix) 02/11/2018, 08/06/2018   Zoster, Live 02/25/2011   Last Pap smear: 10/2013, negative, no high risk HPV present Last mammogram: 01/2023 (required diagnostic on R in 02/2023, benign calcifications) Last colonoscopy: 02/2022 when she had GI bleed--AVM, erosions (negative Cologard 01/2016 and 02/2020) Last DEXA: 01/2023 T-2.2 R fem neck, -2.0 L fem neck Dentist: every 6 months Ophtho: yearly  Exercise:   Home exercises (seated). Walks in her house, and in the parking lot.  15-20 minutes 3-4 days/week. Uses some small hand weights daily, when seated.     May 2023-- new MOST form and DNR form completed.   Copies of living will and healthcare power of attorney are scanned into chart.    PMH, PSH, SH and FH reviewed and updated    ROS:  The patient denies anorexia, fever, headaches, vision changes, ear pain, sore throat, breast concerns, chest pain, palpitations, dizziness, syncope, dyspnea on exertion, cough, swelling, nausea, vomiting, abdominal pain, melena, hematochezia, indigestion/heartburn, hematuria, incontinence (just with cough/sneeze, and some with urge), dysuria,vaginal bleeding, discharge, odor or itch, genital lesions, joint pains, weakness, tremor, suspicious skin lesions, depression, anxiety, abnormal bleeding/bruising (mild, chronic), or enlarged lymph nodes.  Hearing loss bilaterally. Has hearing aids--not using them much, not all that helpful. Sees Dr. Karis  yearly. +tingling in both hands, R>L, no longer in her feet. Denies weakness. Dry mouth and dry skin, not as bad as in the past. Up 2-3x/night to void.  Uses Tylenol PM most nights (not all) due to hand pain. *** She has a runny nose with eating (not bothersome, not wanting sprays or anything that will make it too dry) Slight short-term memory issues (names), nothing that has progressed.   PHYSICAL EXAM:  There were no vitals taken for this visit.  Wt Readings from Last 3 Encounters:  10/15/24 112 lb 12.8 oz (51.2 kg)  09/10/24 110 lb (49.9 kg)  06/04/24 106 lb 6.4 oz (48.3 kg)   Elderly female, in no distress. Hard of hearing.  HEENT: conjunctiva and sclera are clear, EOMI. OP clear.  Significant alopecia across the entire top of her head.  Skin on scalp appears normal. TMs and EAC normal on the right, partially obscured by cerumen on the left, OP clear, sinuses nontender. Neck: no lymphadenopathy, thyromegaly or mass, no carotid bruit Heart: regular rate and rhythm, murmur at RUSB. Lungs: clear bilaterally Extremities: no edema. Thickened, discolored, toenails x 10. Normal monofilament exam. No lesions. Skin: normal turgor, no rashes. Bluish and dry area of skin just at tibeal tuberosity on R. WHSS, other small scars and areas of minimal hyperpigmentation, no new lesions.  Neuro: alert and oriented, cranial nerves grossly intact.  Decreased hearing. Ambulates  with cane. Psych: normal mood, affect, hygiene and grooming.  Declined breast, pelvic, rectal exam ***??UPDATE  ***UPDATE if cerumen no longer on L Update SKIN DIABETIC FOOT EXAM TODAY   ASSESSMENT/PLAN:   Has she had eye exam since 03/2023?? If so, please get  DEXA is due at [facility] in April--remind her to schedule/send referral? Plans for mammo? (Last was 2024) She declined breast and pelvic exam last year--willing for either today??   Discussed monthly self breast exams and yearly mammograms discussed (past due).  She declined pelvic and breast exam today. Recommended at least 30 minutes of aerobic activity at least 5 days/week and weight-bearing exercise 2x/week; proper sunscreen use reviewed; healthy diet, including goals of calcium and vitamin D intake and alcohol recommendations (less than or equal to 1 drink/day) reviewed; regular seatbelt use; changing batteries in smoke detectors.  Immunization recommendations discussed--continue yearly flu shots.   Colonoscopy recommendations reviewed, UTD.  Consider repeat if anemia issues.  Pap smears not needed due to age and low risk. DEXA is due 02/2025 with [facility].  MOST form reviewed from 03/2022, DNR, limited interventions (no ICU), IV fluids and TF for a defined trial period. Patient has original, reviewed copy in chart (so not signed).   Annabelle DELENA Fetters, MD "

## 2024-12-08 NOTE — Progress Notes (Unsigned)
 "  No chief complaint on file.    Subjective:   Tonya Rogers is a 89 y.o. female who presents for a Medicare Annual Wellness Visit.  Fall Screening Falls in the past year?: 0 Number of falls in past year: 0 Was there an injury with Fall?: 0 Fall Risk Category Calculator: 0 Patient Fall Risk Level: Low Fall Risk  Fall Risk Patient at Risk for Falls Due to: No Fall Risks Fall risk Follow up: Falls evaluation completed    Allergies (verified) Patient has no known allergies.   Current Medications (verified) Outpatient Encounter Medications as of 12/09/2024  Medication Sig   acetaminophen (TYLENOL) 650 MG CR tablet Take 650 mg by mouth every 8 (eight) hours as needed for pain.   ALPHA LIPOIC ACID PO Take 1 capsule by mouth daily. 600mg    BIOTIN PO Take 1 tablet by mouth daily.   Calcium Carb-Cholecalciferol (CALCIUM 600+D3) 600-20 MG-MCG TABS Take 1 tablet by mouth daily.   Cholecalciferol (VITAMIN D) 1000 UNITS capsule Take 1,000 Units by mouth daily.   Chromium-Cinnamon [phone] MCG-MG CAPS Take 1 capsule by mouth 2 (two) times daily.   ciclopirox  (PENLAC ) 8 % solution APPLY OVER NAIL AT BEDTIME. APPLY DAILY OVER PREVIOUS COAT. REMOVE WEEKLY WITH POLISH REMOVER   Coenzyme Q10 (CO Q 10) 100 MG CAPS Take 1 capsule by mouth daily.   CRANBERRY PO Take 1 capsule by mouth daily.   cyanocobalamin (VITAMIN B12) 1000 MCG tablet Take 1,000 mcg by mouth daily.   dapagliflozin  propanediol (FARXIGA ) 10 MG TABS tablet Take 1 tablet (10 mg total) by mouth daily.   docusate sodium (COLACE) 100 MG capsule Take 100 mg by mouth 2 (two) times daily.   fish oil-omega-3 fatty acids 1000 MG capsule Take 3 g by mouth daily.   glucose blood test strip 1 each by Other route 2 (two) times daily. Test twice daily   losartan  (COZAAR ) 25 MG tablet TAKE 1 TABLET BY MOUTH DAILY   Melatonin 10 MG TABS Take 1 tablet by mouth at bedtime.   metFORMIN  (GLUCOPHAGE ) 500 MG tablet Take 1 tablet (500 mg total) by  mouth 2 (two) times daily with a meal.   Multiple Vitamins-Minerals (EYE VITAMINS PO) Take 1 capsule by mouth daily.   Multiple Vitamins-Minerals (MULTIVITAMIN WITH MINERALS) tablet Take 1 tablet by mouth daily.   Potassium 99 MG TABS Take 1 tablet by mouth daily.   simvastatin  (ZOCOR ) 20 MG tablet Take 1 tablet (20 mg total) by mouth daily.   Specialty Vitamins Products (MEMORY COMPLEX BRAIN HEALTH PO) Take 1 capsule by mouth daily.   SYNTHROID  75 MCG tablet TAKE 1 TABLET DAILY FOR 6 DAYS A WEEK, SKIP SUNDAYS FOR HYPOTHYROIDISM. TAKE 30 MINUTES BEFORE EATING AS DIRECTED.   vitamin C (ASCORBIC ACID) 500 MG tablet Take 500 mg by mouth daily.   vitamin E 180 MG (400 UNITS) capsule Take 400 Units by mouth daily.   No facility-administered encounter medications on file as of 12/09/2024.    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 Irving   Osteoporosis    Pulmonary nodules    small, seen on CT 12/09   Pure hypercholesterolemia    Past Surgical History:  Procedure Laterality Date   CATARACT EXTRACTION, BILATERAL  01/2011   excision of melanoma  11/23/08   wide excision of melanoma and R inguinal sentinel LN mapping and  biopsy   excision of recurrent melanoma  2011, 06/2014, 02/2018   RLE   TONSILLECTOMY AND ADENOIDECTOMY     TYMPANOSTOMY TUBE PLACEMENT  2013   L ear  (Dr. 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)  Tobacco Use   Smoking status: Never   Smokeless tobacco: Never  Vaping Use   Vaping status: Never Used  Substance and Sexual Activity   Alcohol use: Not Currently    Comment: 3-4 oz wine 3x/week; none in the last 6 months (lost taste)   Drug use: No   Sexual activity: Not Currently   Tobacco Counseling Counseling given: Not Answered  SDOH Screenings   Food Insecurity: No Food Insecurity (02/27/2024)  Housing: Unknown (02/27/2024)  Transportation Needs: No Transportation Needs (02/27/2024)  Utilities: Not At Risk (02/27/2024)  Depression (PHQ2-9): Low Risk (06/04/2024)  Financial Resource Strain: Medium Risk (06/05/2023)  Physical Activity: Insufficiently Active (11/08/2023)  Social Connections: Unknown (06/05/2023)  Stress: No Stress Concern Present (06/05/2023)  Tobacco Use: Low Risk (09/10/2024)   See flowsheets for full screening details  Depression Screen PHQ 2 & 9 Depression Scale- Over the past 2 weeks, how often have you been bothered by any of the following problems? Little interest or pleasure in doing things: 0 Feeling down, depressed, or hopeless (PHQ Adolescent also includes...irritable): 0 PHQ-2 Total Score: 0     Goals Addressed   None          Objective:    There were no vitals filed for this visit. There is no height or weight on file to calculate BMI.  Hearing/Vision screen No results found. Immunizations and Health Maintenance Health Maintenance  Topic Date Due   OPHTHALMOLOGY EXAM  04/09/2024   COVID-19 Vaccine (8 - 2025-26 season) 07/27/2024   FOOT EXAM  11/05/2024   Medicare Annual Wellness (AWV)  11/05/2024   Influenza Vaccine   02/23/2025 (Originally 06/26/2024)   HEMOGLOBIN A1C  03/11/2025   DTaP/Tdap/Td (4 - Td or Tdap) 11/29/2027   Pneumococcal Vaccine: 50+ Years  Completed   Bone Density Scan  Completed   Zoster Vaccines- Shingrix  Completed   Meningococcal B Vaccine  Aged Out   Mammogram  Discontinued        Assessment/Plan:  This is a routine wellness examination for Tonya Rogers.  Patient Care Team: Randol Dawes, MD as PCP - General (Family Medicine) Paris, Laymon SQUIBB, DMD (Dentistry) Roz Anes, MD as Consulting Physician (Ophthalmology) Karis Clunes, MD as Consulting Physician (Otolaryngology) Ivin Kocher, MD as Referring Physician (  Dermatology) Gastroenterology, Margarete Mainland, Janie Le (Hematology and Oncology) Claryce Dallas LABOR, MD as Referring Physician (Surgical Oncology) Lionell Jon DEL, [facility] (Pharmacist) Loel Awanda BIRCH, DPM as Consulting Physician (Podiatry) Morgan, Clayborne CROME, RN as [facility] Elnor Rome BROCKS, MD as Referring Physician (Specialist) Kendall Hoy Jansky, MD as Referring Physician (Gastroenterology)  I have personally reviewed and noted the following in the patients chart:   Medical and social history Use of alcohol, tobacco or illicit drugs  Current medications and supplements including opioid prescriptions. Functional ability and status Nutritional status Physical activity Advanced directives List of other physicians Hospitalizations, surgeries, and ER visits in previous 12 months Vitals Screenings to include cognitive, depression, and falls Referrals and appointments  No orders of the defined types were placed in this encounter.  In addition, I have reviewed and discussed with patient certain preventive protocols, quality metrics, and best practice recommendations. A written personalized care plan for preventive services as well as general preventive health recommendations were provided to patient.   Tonya Rogers, RMA   12/08/2024   No  follow-ups on file.  After Visit Summary: (In Person-Printed) AVS printed and given to the patient  Nurse Notes: none "

## 2024-12-09 ENCOUNTER — Encounter: Payer: Self-pay | Admitting: Family Medicine

## 2024-12-09 ENCOUNTER — Ambulatory Visit: Payer: Self-pay | Admitting: Family Medicine

## 2024-12-09 VITALS — BP 112/72 | HR 72 | Ht 59.0 in | Wt 112.2 lb

## 2024-12-09 DIAGNOSIS — R809 Proteinuria, unspecified: Secondary | ICD-10-CM

## 2024-12-09 DIAGNOSIS — E1169 Type 2 diabetes mellitus with other specified complication: Secondary | ICD-10-CM

## 2024-12-09 DIAGNOSIS — E118 Type 2 diabetes mellitus with unspecified complications: Secondary | ICD-10-CM | POA: Diagnosis not present

## 2024-12-09 DIAGNOSIS — R413 Other amnesia: Secondary | ICD-10-CM

## 2024-12-09 DIAGNOSIS — E785 Hyperlipidemia, unspecified: Secondary | ICD-10-CM | POA: Diagnosis not present

## 2024-12-09 DIAGNOSIS — Z0000 Encounter for general adult medical examination without abnormal findings: Secondary | ICD-10-CM

## 2024-12-09 DIAGNOSIS — Z5181 Encounter for therapeutic drug level monitoring: Secondary | ICD-10-CM

## 2024-12-09 DIAGNOSIS — E039 Hypothyroidism, unspecified: Secondary | ICD-10-CM

## 2024-12-09 DIAGNOSIS — I152 Hypertension secondary to endocrine disorders: Secondary | ICD-10-CM

## 2024-12-09 DIAGNOSIS — C439 Malignant melanoma of skin, unspecified: Secondary | ICD-10-CM

## 2024-12-09 DIAGNOSIS — Z7984 Long term (current) use of oral hypoglycemic drugs: Secondary | ICD-10-CM | POA: Diagnosis not present

## 2024-12-09 DIAGNOSIS — D692 Other nonthrombocytopenic purpura: Secondary | ICD-10-CM

## 2024-12-09 DIAGNOSIS — M8580 Other specified disorders of bone density and structure, unspecified site: Secondary | ICD-10-CM

## 2024-12-09 DIAGNOSIS — C779 Secondary and unspecified malignant neoplasm of lymph node, unspecified: Secondary | ICD-10-CM | POA: Diagnosis not present

## 2024-12-09 LAB — POCT GLYCOSYLATED HEMOGLOBIN (HGB A1C): Hemoglobin A1C: 5.7 % — AB (ref 4.0–5.6)

## 2024-12-10 ENCOUNTER — Ambulatory Visit: Payer: Self-pay | Admitting: Family Medicine

## 2024-12-10 LAB — CBC WITH DIFFERENTIAL/PLATELET
Basophils Absolute: 0.1 x10E3/uL (ref 0.0–0.2)
Basos: 1 %
EOS (ABSOLUTE): 0.5 x10E3/uL — ABNORMAL HIGH (ref 0.0–0.4)
Eos: 5 %
Hematocrit: 47.4 % — ABNORMAL HIGH (ref 34.0–46.6)
Hemoglobin: 15.6 g/dL (ref 11.1–15.9)
Immature Grans (Abs): 0 x10E3/uL (ref 0.0–0.1)
Immature Granulocytes: 0 %
Lymphocytes Absolute: 1.8 x10E3/uL (ref 0.7–3.1)
Lymphs: 19 %
MCH: 29 pg (ref 26.6–33.0)
MCHC: 32.9 g/dL (ref 31.5–35.7)
MCV: 88 fL (ref 79–97)
Monocytes Absolute: 0.8 x10E3/uL (ref 0.1–0.9)
Monocytes: 9 %
Neutrophils Absolute: 6.3 x10E3/uL (ref 1.4–7.0)
Neutrophils: 66 %
Platelets: 404 x10E3/uL (ref 150–450)
RBC: 5.38 x10E6/uL — ABNORMAL HIGH (ref 3.77–5.28)
RDW: 12.8 % (ref 11.7–15.4)
WBC: 9.5 x10E3/uL (ref 3.4–10.8)

## 2024-12-10 LAB — CMP14+EGFR
ALT: 26 IU/L (ref 0–32)
AST: 39 IU/L (ref 0–40)
Albumin: 4.6 g/dL (ref 3.7–4.7)
Alkaline Phosphatase: 74 IU/L (ref 48–129)
BUN/Creatinine Ratio: 34 — ABNORMAL HIGH (ref 12–28)
BUN: 25 mg/dL (ref 8–27)
Bilirubin Total: 0.4 mg/dL (ref 0.0–1.2)
CO2: 24 mmol/L (ref 20–29)
Calcium: 12.2 mg/dL — ABNORMAL HIGH (ref 8.7–10.3)
Chloride: 94 mmol/L — ABNORMAL LOW (ref 96–106)
Creatinine, Ser: 0.74 mg/dL (ref 0.57–1.00)
Globulin, Total: 2.8 g/dL (ref 1.5–4.5)
Glucose: 91 mg/dL (ref 70–99)
Potassium: 3.9 mmol/L (ref 3.5–5.2)
Sodium: 134 mmol/L (ref 134–144)
Total Protein: 7.4 g/dL (ref 6.0–8.5)
eGFR: 79 mL/min/1.73

## 2024-12-10 LAB — VITAMIN B12: Vitamin B-12: >2000 pg/mL — ABNORMAL HIGH (ref 232–1245)

## 2024-12-10 LAB — MICROALBUMIN / CREATININE URINE RATIO
Creatinine, Urine: 14.3 mg/dL
Microalb/Creat Ratio: 420 mg/g{creat} — ABNORMAL HIGH (ref 0–29)
Microalbumin, Urine: 60 ug/mL

## 2024-12-10 LAB — TSH: TSH: 1.6 u[IU]/mL (ref 0.450–4.500)

## 2024-12-11 ENCOUNTER — Telehealth: Payer: Self-pay | Admitting: Internal Medicine

## 2024-12-11 ENCOUNTER — Telehealth: Payer: Self-pay | Admitting: Family Medicine

## 2024-12-11 DIAGNOSIS — E7800 Pure hypercholesterolemia, unspecified: Secondary | ICD-10-CM

## 2024-12-11 DIAGNOSIS — R809 Proteinuria, unspecified: Secondary | ICD-10-CM

## 2024-12-11 MED ORDER — SIMVASTATIN 20 MG PO TABS: 20.0000 mg | ORAL_TABLET | Freq: Every day | ORAL | 0 refills | Status: AC

## 2024-12-11 MED ORDER — LOSARTAN POTASSIUM 25 MG PO TABS: 25.0000 mg | ORAL_TABLET | Freq: Every day | ORAL | 0 refills | Status: AC

## 2024-12-11 MED ORDER — DAPAGLIFLOZIN PROPANEDIOL 10 MG PO TABS: 10.0000 mg | ORAL_TABLET | Freq: Every day | ORAL | 0 refills | Status: AC

## 2024-12-11 NOTE — Telephone Encounter (Signed)
 Pt is coming in Monday for repeat labs and I have printed med list for pt  Copied from CRM #[id number]. Topic: Clinical - Medication Question >> Dec 11, 2024 10:32 AM Willma SAUNDERS wrote: Reason for CRM: Patient requesting to get a message to Veronica. Is requesting a list of all the medications she is currently taking that are prescribed by Dr Randol. Would like to come pick it up at the office today if possible.   Patient can be reached at [phone]

## 2024-12-11 NOTE — Telephone Encounter (Signed)
 Copied from CRM [id number]. Topic: Clinical - Medication Refill >> Dec 11, 2024 10:34 AM Willma R wrote: Medication:  metFORMIN  (GLUCOPHAGE ) 500 MG tablet losartan  (COZAAR ) 25 MG tablet simvastatin  (ZOCOR ) 20 MG tablet  Has the patient contacted their pharmacy? Yes, call dr  This is the patient's preferred pharmacy:  [facility] - [place], [place] - [address] [place] [address] Phone: [phone] Fax: [phone]  Is this the correct pharmacy for this prescription? Yes  Has the prescription been filled recently? No  Is the patient out of the medication? No  Has the patient been seen for an appointment in the last year OR does the patient have an upcoming appointment? Yes  Can we respond through MyChart? No  Agent: Please be advised that Rx refills may take up to 3 business days. We ask that you follow-up with your pharmacy.

## 2024-12-11 NOTE — Telephone Encounter (Unsigned)
 Copied from CRM #[id number]. Topic: Clinical - Medication Refill >> Dec 11, 2024 10:14 AM Joesph B wrote: Medication: metFORMIN  (GLUCOPHAGE ) 500 MG tablet [[id number]]  She has another medication she can not remember and needs a refill. Can someone reach out and go over her med list?   Has the patient contacted their pharmacy? Yes (Agent: If no, request that the patient contact the pharmacy for the refill. If patient does not wish to contact the pharmacy document the reason why and proceed with request.) (Agent: If yes, when and what did the pharmacy advise?)  This is the patient's preferred pharmacy:   [facility] - [place], [place] - [address] [place] [address] Phone: [phone] Fax: [phone]    Is this the correct pharmacy for this prescription? Yes If no, delete pharmacy and type the correct one.   Has the prescription been filled recently? Yes  Is the patient out of the medication? Yes  Has the patient been seen for an appointment in the last year OR does the patient have an upcoming appointment? Yes  Can we respond through MyChart? Yes  Agent: Please be advised that Rx refills may take up to 3 business days. We ask that you follow-up with your pharmacy.

## 2024-12-14 ENCOUNTER — Other Ambulatory Visit

## 2024-12-14 ENCOUNTER — Telehealth: Payer: Self-pay | Admitting: Internal Medicine

## 2024-12-14 ENCOUNTER — Other Ambulatory Visit: Payer: Self-pay | Admitting: Family Medicine

## 2024-12-14 NOTE — Telephone Encounter (Signed)
 cancelled  Copied from CRM #[id number]. Topic: Appointments - Scheduling Inquiry for Clinic >> Dec 14, 2024  8:34 AM Gustabo D wrote: Pt won't make it to her app t today she's not feeling good

## 2024-12-21 ENCOUNTER — Ambulatory Visit: Admitting: Podiatry

## 2024-12-25 ENCOUNTER — Encounter: Payer: Self-pay | Admitting: Family Medicine

## 2024-12-28 ENCOUNTER — Ambulatory Visit: Admitting: Podiatry

## 2025-01-04 ENCOUNTER — Ambulatory Visit: Admitting: Podiatry

## 2025-06-10 ENCOUNTER — Ambulatory Visit: Admitting: Family Medicine

## 2025-12-09 ENCOUNTER — Encounter: Admitting: Family Medicine

## 2025-12-27 ENCOUNTER — Encounter: Admitting: Family Medicine
